# Patient Record
Sex: Female | Born: 1995
Health system: Southern US, Community
[De-identification: ages and names within clinical notes are randomized; demographics above are authoritative.]

## PROBLEM LIST (undated history)

## (undated) DIAGNOSIS — O2230 Deep phlebothrombosis in pregnancy, unspecified trimester: Secondary | ICD-10-CM

## (undated) DIAGNOSIS — O139 Gestational [pregnancy-induced] hypertension without significant proteinuria, unspecified trimester: Secondary | ICD-10-CM

## (undated) DIAGNOSIS — O24419 Gestational diabetes mellitus in pregnancy, unspecified control: Secondary | ICD-10-CM

## (undated) DIAGNOSIS — A4902 Methicillin resistant Staphylococcus aureus infection, unspecified site: Secondary | ICD-10-CM

## (undated) DIAGNOSIS — T7840XA Allergy, unspecified, initial encounter: Secondary | ICD-10-CM

## (undated) DIAGNOSIS — A6000 Herpesviral infection of urogenital system, unspecified: Secondary | ICD-10-CM

## (undated) DIAGNOSIS — T8859XA Other complications of anesthesia, initial encounter: Secondary | ICD-10-CM

## (undated) DIAGNOSIS — R519 Headache, unspecified: Secondary | ICD-10-CM

## (undated) HISTORY — DX: Gestational (pregnancy-induced) hypertension without significant proteinuria, unspecified trimester: O13.9

## (undated) HISTORY — DX: Allergy, unspecified, initial encounter: T78.40XA

## (undated) HISTORY — PX: TONSILLECTOMY: SUR1361

## (undated) HISTORY — DX: Methicillin resistant Staphylococcus aureus infection, unspecified site: A49.02

## (undated) HISTORY — PX: CHOLECYSTECTOMY, LAPAROSCOPIC: SHX56

---
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
## 2013-03-17 ENCOUNTER — Emergency Department: Payer: Self-pay | Admitting: Emergency Medicine

## 2013-08-25 ENCOUNTER — Emergency Department (HOSPITAL_COMMUNITY)
Admission: EM | Admit: 2013-08-25 | Discharge: 2013-08-25 | Discharge status: Against Medical Advice | Payer: Self-pay | Attending: Emergency Medicine | Admitting: Emergency Medicine

## 2013-08-25 ENCOUNTER — Encounter (HOSPITAL_COMMUNITY): Payer: Self-pay | Admitting: Emergency Medicine

## 2013-08-25 DIAGNOSIS — S3140XA Unspecified open wound of vagina and vulva, initial encounter: Secondary | ICD-10-CM | POA: Insufficient documentation

## 2013-08-25 DIAGNOSIS — W268XXA Contact with other sharp object(s), not elsewhere classified, initial encounter: Secondary | ICD-10-CM | POA: Insufficient documentation

## 2013-08-25 DIAGNOSIS — F17200 Nicotine dependence, unspecified, uncomplicated: Secondary | ICD-10-CM | POA: Insufficient documentation

## 2013-08-25 DIAGNOSIS — Y929 Unspecified place or not applicable: Secondary | ICD-10-CM | POA: Insufficient documentation

## 2013-08-25 DIAGNOSIS — Y9389 Activity, other specified: Secondary | ICD-10-CM | POA: Insufficient documentation

## 2013-08-25 NOTE — ED Notes (Signed)
No answer, unable to find, not in w/r, h/w, entryway, b/r or triage.  

## 2013-08-25 NOTE — ED Notes (Signed)
Pt c/o multiple "cuts" and swelling to vaginal area x 5 days. Pt reports yellowish discharge. Pt denies trauma to the area, reports that she does shave.

## 2014-08-01 NOTE — H&P (Signed)
PATIENT NAME:  Angela Flynn, Angela Flynn MR#:  [id number] DATE OF BIRTH:  04/24/1995  DATE OF ADMISSION:  09/18/2012  CHIEF COMPLAINT: Right upper quadrant pain.   HISTORY OF PRESENT ILLNESS: This is a 19 year old female patient with abdominal pain.  She has had some nausea and vomiting.  She has been to the Emergency Room.  She has had cramping and abdominal pain over a year's period but worsening over the last month, and she is here for elective laparoscopic cholecystectomy after her workup showed gallstones.   PAST MEDICAL HISTORY: History of MRSA infection.   PAST SURGICAL HISTORY:  1.  Tonsillectomy.  2.  Adenoidectomy.  3.  Cyst excision from chest wall.   MEDICATIONS: Percocet.   ALLERGIES: None.   FAMILY HISTORY: Noncontributory.   SOCIAL HISTORY: The patient smokes tobacco but does not drink alcohol.   REVIEW OF SYSTEMS: Ten-system review has been performed and negative with the exception of that mentioned in the HPI.   PHYSICAL EXAMINATION: GENERAL: Healthy-appearing female patient.  HEENT: No scleral icterus.  NECK: No palpable neck nodes.  CHEST: Clear to auscultation.  CARDIAC: Regular rate and rhythm.  ABDOMEN: Soft and minimally tender in the right upper quadrant.   EXTREMITIES: Without edema.  NEUROLOGIC: Grossly intact.  INTEGUMENT: Shows no jaundice.   LABORATORY AND RADIOLOGICAL DATA:  Lab values demonstrate normal electrolytes. White blood cell count 8.3, H and H at 13 and 40.  An ultrasound shows gallstones.   ASSESSMENT AND PLAN: This is a patient with symptomatic cholelithiasis.  I have recommended a laparoscopic cholecystectomy for control of her symptoms.  The options of observation have been reviewed and the risks of bleeding, infection, recurrence of symptoms, failure to resolve her symptoms, open procedure, bile duct damage, bile duct leak, retained common bile duct stone, any of which could require further surgery and/or ERCP, stent and papillotomy have all  been discussed. She understood and agreed to proceed.     ____________________________ Adah Salvageichard E. Excell Seltzerooper, MD rec:cb D: 09/17/2012 18:19:42 ET T: 09/17/2012 19:59:58 ET JOB#: [id number]  cc: Adah Salvageichard E. Excell Seltzerooper, MD, <Dictator> Lattie HawICHARD E Kerby Borner MD ELECTRONICALLY SIGNED 09/18/2012 16:55

## 2014-08-01 NOTE — Op Note (Signed)
PATIENT NAME:  Angela Flynn, Angela Flynn MR#:  [id number] DATE OF BIRTH:  [birth year]  DATE OF PROCEDURE:  09/18/2012  PREOPERATIVE DIAGNOSIS: Biliary colic.   POSTOPERATIVE DIAGNOSIS: Biliary colic.   PROCEDURE: Laparoscopic cholecystectomy.   SURGEON: Miaisabella Bacorn E. Excell Seltzerooper, MD  ASSISTANT: Trellis MomentNatalie Kretzer, PA-S   INDICATIONS: This is a patient with recurrent episodic right upper quadrant pain associated with fatty food intolerance and workup showing gallstones. Preoperatively, we discussed the rationale for surgery, the options of observation, risk of bleeding, infection, recurrence of symptoms, failure to resolve all of her symptoms, open procedure, bile duct damage, bile duct leak and retained common bile duct stone, any of which could require further surgery and/or ERCP, stent and papillotomy. This was reviewed for her and her family in the preop holding area. They understood and agreed to proceed.   FINDINGS: Normal-appearing gallbladder with small gallstones, minimal scarified adhesions to the anterior surface of the gallbladder.   DESCRIPTION OF PROCEDURE: The patient was induced to general anesthesia, given IV antibiotics. VTE prophylaxis was in place. She was prepped and draped in a sterile fashion. Marcaine was infiltrated in skin and subcutaneous tissues around the periumbilical area. Incision was made. Veress needle was placed. Pneumoperitoneum was obtained, and a 5 mm trocar port was placed. The abdominal cavity was explored, and under direct vision, a 10 mm epigastric port and 2 lateral 5 mm ports were placed. The gallbladder was placed on tension, and adhesions were taken down bluntly without the use of energy. The peritoneum over the infundibulum was incised bluntly, and the cystic duct-gallbladder junction was well identified. The node of Calot was identified. The cystic lymphatics were doubly clipped and divided, allowing for good visualization of the cystic duct as it entered the infundibulum of  the gallbladder. Here, it was doubly clipped and divided, and then the cystic artery was doubly clipped and divided, and the gallbladder was taken from the gallbladder fossa with electrocautery and passed out through the epigastric port site with the aid of an Endo Catch bag. The area was irrigated with copious amounts of normal saline, which was aspirated. Small stones were retrieved that had been spilled, and then once assuring that there was no sign of bleeding or bile leak or bowel injury, the camera was placed in the epigastric site to view back to the periumbilical site. There was no sign of adhesions or bowel injury. Therefore, pneumoperitoneum was released. All ports were removed. Fascial edges at the epigastric site were approximated with figure-of-eight 0 Vicryls. The 4-0 subcuticular Monocryl was used on all skin edges. Steri-Strips, Mastisol and sterile dressings were placed.   The patient tolerated the procedure well. There were no complications. She was taken to the recovery room in stable condition, to be discharged in the care of her family and follow up in 10 days. Sponge, lap and needle count was correct.   ____________________________ Adah Salvageichard E. Excell Seltzerooper, MD rec:OSi D: 09/18/2012 08:11:19 ET T: 09/18/2012 09:56:05 ET JOB#: [id number]  cc: Adah Salvageichard E. Excell Seltzerooper, MD, <Dictator> Lattie HawICHARD E Truong Delcastillo MD ELECTRONICALLY SIGNED 09/18/2012 16:55

## 2014-08-07 ENCOUNTER — Encounter (HOSPITAL_COMMUNITY): Payer: Self-pay | Admitting: Emergency Medicine

## 2014-08-07 ENCOUNTER — Emergency Department (HOSPITAL_COMMUNITY)
Admission: EM | Admit: 2014-08-07 | Discharge: 2014-08-07 | Disposition: A | Discharge status: Home / Self Care | Payer: Self-pay | Attending: Emergency Medicine | Admitting: Emergency Medicine

## 2014-08-07 ENCOUNTER — Emergency Department (HOSPITAL_COMMUNITY): Payer: Self-pay

## 2014-08-07 DIAGNOSIS — Z720 Tobacco use: Secondary | ICD-10-CM | POA: Insufficient documentation

## 2014-08-07 DIAGNOSIS — Y939 Activity, unspecified: Secondary | ICD-10-CM | POA: Insufficient documentation

## 2014-08-07 DIAGNOSIS — Y92009 Unspecified place in unspecified non-institutional (private) residence as the place of occurrence of the external cause: Secondary | ICD-10-CM | POA: Insufficient documentation

## 2014-08-07 DIAGNOSIS — S50811A Abrasion of right forearm, initial encounter: Secondary | ICD-10-CM | POA: Insufficient documentation

## 2014-08-07 DIAGNOSIS — Y999 Unspecified external cause status: Secondary | ICD-10-CM | POA: Insufficient documentation

## 2014-08-07 DIAGNOSIS — W25XXXA Contact with sharp glass, initial encounter: Secondary | ICD-10-CM | POA: Insufficient documentation

## 2014-08-07 DIAGNOSIS — Z23 Encounter for immunization: Secondary | ICD-10-CM | POA: Insufficient documentation

## 2014-08-07 DIAGNOSIS — S81812A Laceration without foreign body, left lower leg, initial encounter: Secondary | ICD-10-CM | POA: Insufficient documentation

## 2014-08-07 MED ORDER — SULFAMETHOXAZOLE-TRIMETHOPRIM 800-160 MG PO TABS: 1.0000 | ORAL_TABLET | Freq: Two times a day (BID) | ORAL | Status: AC

## 2014-08-07 MED ORDER — TETANUS-DIPHTH-ACELL PERTUSSIS 5-2.5-18.5 LF-MCG/0.5 IM SUSP
0.5000 mL | Freq: Once | INTRAMUSCULAR | Status: AC
  Administered 2014-08-07: 0.5 mL via INTRAMUSCULAR
  Filled 2014-08-07: qty 0.5

## 2014-08-07 MED ORDER — OXYCODONE-ACETAMINOPHEN 5-325 MG PO TABS
2.0000 | ORAL_TABLET | Freq: Once | ORAL | Status: AC
  Administered 2014-08-07: 2 via ORAL
  Filled 2014-08-07: qty 2

## 2014-08-07 MED ORDER — LIDOCAINE-EPINEPHRINE (PF) 2 %-1:200000 IJ SOLN
10.0000 mL | Freq: Once | INTRAMUSCULAR | Status: AC
  Administered 2014-08-07: 10 mL
  Filled 2014-08-07: qty 20

## 2014-08-07 MED ORDER — HYDROCODONE-ACETAMINOPHEN 5-325 MG PO TABS: 1.0000 | ORAL_TABLET | Freq: Four times a day (QID) | ORAL | Status: DC | PRN

## 2014-08-07 NOTE — ED Provider Notes (Signed)
CSN: [id number]     Arrival date & time 08/07/14  2027 History  This chart was scribed for Santiago GladHeather Copelyn Widmer PA-C working with Lorre NickAnthony Allen, MD by Elveria Risingimelie Horne, ED Scribe. This patient was seen in room TR10C/TR10C and the patient's care was started at 9:42 PM.   Chief Complaint  Patient presents with  . Extremity Laceration   The history is provided by the patient. No language interpreter was used.   HPI Comments: Angela Flynn is a 19 y.o. female who presents to the Emergency Department with laceration to left lower anterolateral leg and multiple abrasions incurred early this morning. Patient reports dropping a large picture frame, shattering the glass then tripping, landing on the ventral surface of body and sliding on the broken glass.  Patient reports a violent fall; she presents with smaller lacerations and abrasions to her right forearm and broken acrylic nails. Patient reports profuse bleeding from the laceration sustained to her lower leg. Patient reports exacerbated pain with applied pressure. Patient ambulatory since the incident. Patient has not taken any pain medication. Patient states that she did come to ED immediately following her injury because she wasn't aware of the severity. Foreign body removed from laceration on exam: 1cm piece of shattered glass.    History reviewed. No pertinent past medical history. Past Surgical History  Procedure Laterality Date  . Abdominal surgery     No family history on file. History  Substance Use Topics  . Smoking status: Current Every Day Smoker  . Smokeless tobacco: Not on file  . Alcohol Use: Yes   OB History    No data available     Review of Systems  Constitutional: Negative for fever.  Skin: Positive for wound.       Laceration   Neurological: Negative for weakness and numbness.      Allergies  Review of patient's allergies indicates no known allergies.  Home Medications   Prior to Admission medications   Not on  File   Triage Vitals: BP 114/89 mmHg  Pulse 93  Temp(Src) 98.5 F (36.9 C) (Oral)  Resp 16  Ht 5\' 9"  (1.753 m)  Wt 228 lb (103.42 kg)  BMI 33.65 kg/m2  SpO2 99%  LMP 08/07/2014 Physical Exam  Constitutional: She is oriented to person, place, and time. She appears well-developed and well-nourished. No distress.  HENT:  Head: Normocephalic and atraumatic.  Eyes: EOM are normal.  Neck: Neck supple. No tracheal deviation present.  Cardiovascular: Normal rate, regular rhythm and normal heart sounds.   Pulmonary/Chest: Effort normal and breath sounds normal. No respiratory distress.  Musculoskeletal: Normal range of motion.  Neurological: She is alert and oriented to person, place, and time.  Skin: Skin is warm and dry.  5.5 cm gaping linear laceration to left anterior lateral superior portion of lower leg. Bleeding controlled.  Linear abrasion just superior to the laceration.  Linear abrasion to the left lower lateral leg.  No active bleeding.  Psychiatric: She has a normal mood and affect. Her behavior is normal.  Nursing note and vitals reviewed.   ED Course  Procedures (including critical care time)  COORDINATION OF CARE: 9:56 PM- Discussed treatment plan with patient at bedside and patient agreed to plan.   Labs Review Labs Reviewed - No data to display  Imaging Review No results found.   EKG Interpretation None      Patient discussed with Dr Freida BusmanAllen.  LACERATION REPAIR Performed by: Santiago GladLaisure, Zaide Mcclenahan Authorized by: Santiago GladLaisure, Daryan Cagley Consent: Verbal  consent obtained. Risks and benefits: risks, benefits and alternatives were discussed Consent given by: patient Patient identity confirmed: provided demographic data Prepped and Draped in normal sterile fashion Wound explored  Laceration Location: left lower leg  Laceration Length: 5.5 cm  No Foreign Bodies seen or palpated  Anesthesia: local infiltration  Local anesthetic: lidocaine 2% with  epinephrine  Anesthetic total: 5 ml  Irrigation method: syringe Amount of cleaning: standard  Skin closure: staples  Number of sutures: 6  Technique: staple  Patient tolerance: Patient tolerated the procedure well with no immediate complications.  MDM   Final diagnoses:  None   Patient with a laceration to the left lower leg that was sustained approximately 17 hours prior to arrival in the ED.  Laceration appears clean with no signs of infection.  Laceration is 5.5 cm and widely gaping.  Therefore, feel that wound should be closed even though it is past the recommended time.  Wound irrigated very well prior to closure.  Xray not showing any evidence of foreign body.  Wound explored and no foreign body visualized.  Due to the fact that closure was delayed the patient was started on antibiotics.  Tetanus updated in the ED.  Feel that the patient is stable for discharge.  Patient discussed with Dr. Freida Busman who is in agreement with the plan.  Return precautions given.    Santiago Glad, PA-C 08/09/14 1102  Lorre Nick, MD 08/13/14 [phone]

## 2014-08-07 NOTE — ED Notes (Signed)
Pt. slipped / fell last night at home and landed on broken glass , presents with laceration at left lateral lower leg with minimal bleeding /dressing applied prior to arrival . No LOC / ambulatory.

## 2014-08-12 ENCOUNTER — Emergency Department
Admission: EM | Admit: 2014-08-12 | Discharge: 2014-08-12 | Disposition: A | Discharge status: Home / Self Care | Payer: Self-pay | Attending: Emergency Medicine | Admitting: Emergency Medicine

## 2014-08-12 ENCOUNTER — Encounter: Payer: Self-pay | Admitting: Emergency Medicine

## 2014-08-12 DIAGNOSIS — R52 Pain, unspecified: Secondary | ICD-10-CM

## 2014-08-12 DIAGNOSIS — Z720 Tobacco use: Secondary | ICD-10-CM | POA: Insufficient documentation

## 2014-08-12 DIAGNOSIS — Z5189 Encounter for other specified aftercare: Secondary | ICD-10-CM

## 2014-08-12 DIAGNOSIS — Z792 Long term (current) use of antibiotics: Secondary | ICD-10-CM | POA: Insufficient documentation

## 2014-08-12 DIAGNOSIS — Z4801 Encounter for change or removal of surgical wound dressing: Secondary | ICD-10-CM | POA: Insufficient documentation

## 2014-08-12 DIAGNOSIS — G8911 Acute pain due to trauma: Secondary | ICD-10-CM | POA: Insufficient documentation

## 2014-08-12 MED ORDER — TRAMADOL HCL 50 MG PO TABS: 50.0000 mg | ORAL_TABLET | Freq: Four times a day (QID) | ORAL | Status: DC | PRN

## 2014-08-12 MED ORDER — IBUPROFEN 800 MG PO TABS: 800.0000 mg | ORAL_TABLET | Freq: Three times a day (TID) | ORAL | Status: DC | PRN

## 2014-08-12 NOTE — ED Notes (Signed)
Had staples placed at [facility] 3 days ago for left lower leg injury, stats is out of her pain meds,  Did not want to go to [place]

## 2014-08-12 NOTE — ED Notes (Signed)
Pt reports that a picture frame broke and cut her leg, [facility] and [facility] gave her staples. She reports that there is an open area that she needs looked at and she only got 10 pain pills and she has went through them and is still hurting.

## 2014-08-12 NOTE — ED Provider Notes (Signed)
[facility] Emergency Department Provider Note    ____________________________________________  Time seen:1745   I have reviewed the triage vital signs and the nursing notes.   HISTORY  Chief Complaint Wound Check       HPI Angela Flynn is a 19 y.o. female here for wound reevaluation and pain management states she was seen April 28 cut her leg on glass that wound is healing well but it's hurting she is out of pain medications here today for recheck reevaluation and pain management rates her pain as about a 10 out of 10 if she moves or touches her leg says there is no drainage she's taking all antibiotics as prescribed and changing the dressings daily     Past Medical History  Diagnosis Date  . Arthritis     There are no active problems to display for this patient.   Past Surgical History  Procedure Laterality Date  . Abdominal surgery      Current Outpatient Rx  Name  Route  Sig  Dispense  Refill  . HYDROcodone-acetaminophen (NORCO/VICODIN) 5-325 MG per tablet   Oral   Take 1-2 tablets by mouth every 6 (six) hours as needed.   10 tablet   0   . ibuprofen (ADVIL,MOTRIN) 800 MG tablet   Oral   Take 1 tablet (800 mg total) by mouth every 8 (eight) hours as needed.   30 tablet   0   . sulfamethoxazole-trimethoprim (BACTRIM DS,SEPTRA DS) 800-160 MG per tablet   Oral   Take 1 tablet by mouth 2 (two) times daily.   14 tablet   0   . traMADol (ULTRAM) 50 MG tablet   Oral   Take 1 tablet (50 mg total) by mouth every 6 (six) hours as needed.   12 tablet   0     Allergies Review of patient's allergies indicates no known allergies.  No family history on file.  Social History History  Substance Use Topics  . Smoking status: Current Every Day Smoker  . Smokeless tobacco: Not on file  . Alcohol Use: Yes    Review of Systems  Constitutional: Negative for fever. Eyes: Negative for visual changes. ENT: Negative for sore  throat. Cardiovascular: Negative for chest pain. Respiratory: Negative for shortness of breath. Gastrointestinal: Negative for abdominal pain, vomiting and diarrhea. Genitourinary: Negative for dysuria. Musculoskeletal: Negative for back pain. Skin: Negative for rash. Neurological: Negative for headaches, focal weakness or numbness.   10-point ROS otherwise negative.  ____________________________________________   PHYSICAL EXAM:  VITAL SIGNS: ED Triage Vitals  Enc Vitals Group     BP 08/12/14 1643 140/87 mmHg     Pulse Rate 08/12/14 1643 95     Resp --      Temp 08/12/14 1643 99.5 F (37.5 C)     Temp Source 08/12/14 1643 Oral     SpO2 08/12/14 1643 98 %     Weight 08/12/14 1643 238 lb (107.956 kg)     Height 08/12/14 1643 5\' 9"  (1.753 m)     Head Cir --      Peak Flow --      Pain Score 08/12/14 1650 9     Pain Loc --      Pain Edu? --      Excl. in GC? --        Cardiovascular: Normal rate, regular rhythm. Normal and symmetric distal pulses are present in all extremities. No murmurs, rubs, or gallops. Respiratory: Normal respiratory effort  without tachypnea nor retractions. Breath sounds are clear and equal bilaterally. No wheezes/rales/rhonchi.  Musculoskeletal: Nontender with normal range of motion in all extremities. No joint effusions.  No lower extremity tenderness nor edema. Neurologic:  Normal speech and language. No gross focal neurologic deficits are appreciated. Speech is normal. No gait instability. Skin:  Patient has a well-healing wound to her left leg staples in place no sign of infection cellulitis Psychiatric: Mood and affect are normal. Speech and behavior are normal. Patient exhibits appropriate insight and judgment.  ____________________________________________      PROCEDURES  Procedure(s) performed: None  Critical Care performed: No  ____________________________________________   INITIAL IMPRESSION / ASSESSMENT AND PLAN / ED  COURSE  Pertinent labs & imaging results that were available during my care of the patient were reviewed by me and considered in my medical decision making (see chart for details).  Wound recheck of laceration pain management patient's wound was redressed with discharge home given prescription for Motrin tramadol follow-up with her doctor as directed for staple removal  ____________________________________________   FINAL CLINICAL IMPRESSION(S) / ED DIAGNOSES  Final diagnoses:  Encounter for post-traumatic wound check  Pain    Alfred Eckley Rosalyn Gess, PA-C 08/12/14 1815  Loleta Rose, MD 08/12/14 2108

## 2014-08-19 ENCOUNTER — Emergency Department
Admission: EM | Admit: 2014-08-19 | Discharge: 2014-08-19 | Disposition: A | Discharge status: Home / Self Care | Payer: Self-pay | Attending: Emergency Medicine | Admitting: Emergency Medicine

## 2014-08-19 ENCOUNTER — Encounter: Payer: Self-pay | Admitting: Emergency Medicine

## 2014-08-19 DIAGNOSIS — Z4802 Encounter for removal of sutures: Secondary | ICD-10-CM | POA: Insufficient documentation

## 2014-08-19 DIAGNOSIS — Z720 Tobacco use: Secondary | ICD-10-CM | POA: Insufficient documentation

## 2014-08-19 MED ORDER — BACITRACIN ZINC 500 UNIT/GM EX OINT
TOPICAL_OINTMENT | CUTANEOUS | Status: AC
  Administered 2014-08-19: 15:00:00
  Filled 2014-08-19: qty 0.9

## 2014-08-19 NOTE — ED Provider Notes (Signed)
[facility] Emergency Department Provider Note  ____________________________________________  Time seen:----------------------------------------- 2:02 PM on 08/19/2014 -----------------------------------------    I have reviewed the triage vital signs and the nursing notes.   HISTORY  Chief Complaint Suture / Staple Removal  Staple removal  HPI Angela Flynn is a 19 y.o. female since the ER for staple removal denies any complaints at this time. States she's doing fine.   Past Medical History  Diagnosis Date  . Arthritis     There are no active problems to display for this patient.   Past Surgical History  Procedure Laterality Date  . Abdominal surgery      Current Outpatient Rx  Name  Route  Sig  Dispense  Refill  . HYDROcodone-acetaminophen (NORCO/VICODIN) 5-325 MG per tablet   Oral   Take 1-2 tablets by mouth every 6 (six) hours as needed.   10 tablet   0   . ibuprofen (ADVIL,MOTRIN) 800 MG tablet   Oral   Take 1 tablet (800 mg total) by mouth every 8 (eight) hours as needed.   30 tablet   0   . traMADol (ULTRAM) 50 MG tablet   Oral   Take 1 tablet (50 mg total) by mouth every 6 (six) hours as needed.   12 tablet   0     Allergies Review of patient's allergies indicates no known allergies.  History reviewed. No pertinent family history.  Social History History  Substance Use Topics  . Smoking status: Current Every Day Smoker  . Smokeless tobacco: Not on file  . Alcohol Use: Yes    Review of Systems Constitutional: No fever/chills Skin: Negative for rash. Left inner thigh well-healed laceration staples intact with no evidence of erythema. Superficial wound still opened above the laceration. No obvious drainage or erythema or bleeding.   10-point ROS otherwise negative.  ____________________________________________   PHYSICAL EXAM:  VITAL SIGNS: ED Triage Vitals  Enc Vitals Group     BP --      Pulse  --      Resp --      Temp --      Temp src --      SpO2 --      Weight --      Height --      Head Cir --      Peak Flow --      Pain Score --      Pain Loc --      Pain Edu? --      Excl. in GC? --     Constitutional: Alert and oriented. Well appearing and in no acute distress.  Skin:  Skin is warm, dry and intact. No rash noted. Staples intact. No redness erythema or drainage. Psychiatric: Mood and affect are normal. Speech and behavior are normal.  ____________________________________________   LABS (all labs ordered are listed, but only abnormal results are displayed)  Labs Reviewed - No data to display ____________________________________________  EKG  None ____________________________________________  RADIOLOGY  None ____________________________________________   PROCEDURES  Procedure(s) performed: None  Critical Care performed: No  ____________________________________________   INITIAL IMPRESSION / ASSESSMENT AND PLAN / ED COURSE  Pertinent labs & imaging results that were available during my care of the patient were reviewed by me and considered in my medical decision making (see chart for details).  Staples removed instructions given to the patient. No further questions or [facility] at this time. thank you ____________________________________________   FINAL CLINICAL IMPRESSION(S) /  ED DIAGNOSES  Final diagnoses:  Encounter for staple removal      Evangeline DakinCharles M Saddie Sandeen, PA-C 08/19/14 1505  Myrna Blazeravid Matthew Schaevitz, MD 08/19/14 [phone]

## 2014-08-19 NOTE — Discharge Instructions (Signed)
Use Bacitracin or Triple Anti-biotic ointment as needed.

## 2015-03-28 ENCOUNTER — Emergency Department: Payer: Self-pay

## 2015-03-28 ENCOUNTER — Encounter: Payer: Self-pay | Admitting: Emergency Medicine

## 2015-03-28 ENCOUNTER — Inpatient Hospital Stay
Admission: EM | Admit: 2015-03-28 | Discharge: 2015-03-31 | DRG: 759 | Disposition: A | Discharge status: Home / Self Care | Payer: Self-pay | Attending: Obstetrics and Gynecology | Admitting: Obstetrics and Gynecology

## 2015-03-28 DIAGNOSIS — F17210 Nicotine dependence, cigarettes, uncomplicated: Secondary | ICD-10-CM | POA: Diagnosis present

## 2015-03-28 DIAGNOSIS — D72829 Elevated white blood cell count, unspecified: Secondary | ICD-10-CM | POA: Diagnosis present

## 2015-03-28 DIAGNOSIS — R6510 Systemic inflammatory response syndrome (SIRS) of non-infectious origin without acute organ dysfunction: Secondary | ICD-10-CM | POA: Diagnosis present

## 2015-03-28 DIAGNOSIS — N739 Female pelvic inflammatory disease, unspecified: Secondary | ICD-10-CM | POA: Diagnosis present

## 2015-03-28 DIAGNOSIS — A549 Gonococcal infection, unspecified: Secondary | ICD-10-CM | POA: Diagnosis present

## 2015-03-28 DIAGNOSIS — E876 Hypokalemia: Secondary | ICD-10-CM | POA: Diagnosis present

## 2015-03-28 DIAGNOSIS — A5424 Gonococcal female pelvic inflammatory disease: Principal | ICD-10-CM | POA: Diagnosis present

## 2015-03-28 HISTORY — DX: Herpesviral infection of urogenital system, unspecified: A60.00

## 2015-03-28 LAB — COMPREHENSIVE METABOLIC PANEL
ALK PHOS: 74 U/L (ref 38–126)
ALT: 12 U/L — ABNORMAL LOW (ref 14–54)
AST: 13 U/L — AB (ref 15–41)
Albumin: 3.9 g/dL (ref 3.5–5.0)
Anion gap: 11 (ref 5–15)
BILIRUBIN TOTAL: 1.1 mg/dL (ref 0.3–1.2)
BUN: 13 mg/dL (ref 6–20)
CALCIUM: 9 mg/dL (ref 8.9–10.3)
CO2: 24 mmol/L (ref 22–32)
CREATININE: 0.57 mg/dL (ref 0.44–1.00)
Chloride: 102 mmol/L (ref 101–111)
GFR calc Af Amer: >60 mL/min (ref >60)
Glucose, Bld: 112 mg/dL — ABNORMAL HIGH (ref 65–99)
POTASSIUM: 3.3 mmol/L — AB (ref 3.5–5.1)
Sodium: 137 mmol/L (ref 135–145)
TOTAL PROTEIN: 7.7 g/dL (ref 6.5–8.1)

## 2015-03-28 LAB — CBC
HEMATOCRIT: 41.7 % (ref 35.0–47.0)
Hemoglobin: 13.7 g/dL (ref 12.0–16.0)
MCH: 30.4 pg (ref 26.0–34.0)
MCHC: 32.9 g/dL (ref 32.0–36.0)
MCV: 92.4 fL (ref 80.0–100.0)
PLATELETS: 250 10*3/uL (ref 150–440)
RBC: 4.51 MIL/uL (ref 3.80–5.20)
RDW: 12.8 % (ref 11.5–14.5)
WBC: 30.1 10*3/uL — ABNORMAL HIGH (ref 3.6–11.0)

## 2015-03-28 LAB — URINALYSIS COMPLETE WITH MICROSCOPIC (ARMC ONLY)
BACTERIA UA: NONE SEEN
BILIRUBIN URINE: NEGATIVE
Glucose, UA: NEGATIVE mg/dL
NITRITE: NEGATIVE
Protein, ur: 100 mg/dL — AB
Specific Gravity, Urine: 1.035 — ABNORMAL HIGH (ref 1.005–1.030)
pH: 5 (ref 5.0–8.0)

## 2015-03-28 LAB — CHLAMYDIA/NGC RT PCR (ARMC ONLY)
CHLAMYDIA TR: NOT DETECTED
N GONORRHOEAE: DETECTED — AB

## 2015-03-28 LAB — POCT PREGNANCY, URINE: PREG TEST UR: NEGATIVE

## 2015-03-28 LAB — LIPASE, BLOOD: Lipase: 13 U/L (ref 11–51)

## 2015-03-28 MED ORDER — SODIUM CHLORIDE 0.9 % IV BOLUS (SEPSIS)
1000.0000 mL | Freq: Once | INTRAVENOUS | Status: AC
  Administered 2015-03-28: 1000 mL via INTRAVENOUS

## 2015-03-28 MED ORDER — HYDROMORPHONE HCL 1 MG/ML IJ SOLN
1.0000 mg | INTRAMUSCULAR | Status: AC
  Administered 2015-03-28: 1 mg via INTRAVENOUS
  Filled 2015-03-28: qty 1

## 2015-03-28 MED ORDER — HYDROMORPHONE HCL 1 MG/ML IJ SOLN
0.5000 mg | Freq: Once | INTRAMUSCULAR | Status: AC
Start: 2015-03-28 — End: 2015-03-28
  Administered 2015-03-28: 0.5 mg via INTRAVENOUS

## 2015-03-28 MED ORDER — DEXTROSE 5 % IV SOLN
2.0000 g | Freq: Four times a day (QID) | INTRAVENOUS | Status: DC
  Administered 2015-03-29 – 2015-03-30 (×7): 2 g via INTRAVENOUS
  Filled 2015-03-28 (×10): qty 2

## 2015-03-28 MED ORDER — HYDROMORPHONE HCL 1 MG/ML IJ SOLN
1.0000 mg | Freq: Once | INTRAMUSCULAR | Status: AC
Start: 2015-03-28 — End: 2015-03-28
  Administered 2015-03-28: 1 mg via INTRAVENOUS

## 2015-03-28 MED ORDER — ONDANSETRON HCL 4 MG/2ML IJ SOLN
4.0000 mg | Freq: Once | INTRAMUSCULAR | Status: AC
  Administered 2015-03-28: 4 mg via INTRAVENOUS

## 2015-03-28 MED ORDER — IOHEXOL 240 MG/ML SOLN
25.0000 mL | Freq: Once | INTRAMUSCULAR | Status: AC | PRN
  Administered 2015-03-28: 25 mL via ORAL

## 2015-03-28 MED ORDER — MORPHINE SULFATE (PF) 4 MG/ML IV SOLN
4.0000 mg | Freq: Once | INTRAVENOUS | Status: AC
  Administered 2015-03-28: 4 mg via INTRAVENOUS
  Filled 2015-03-28: qty 1

## 2015-03-28 MED ORDER — IOHEXOL 300 MG/ML  SOLN: 100.0000 mL | Freq: Once | INTRAMUSCULAR | Status: DC | PRN

## 2015-03-28 MED ORDER — IOHEXOL 300 MG/ML  SOLN
125.0000 mL | Freq: Once | INTRAMUSCULAR | Status: AC | PRN
  Administered 2015-03-28: 125 mL via INTRAVENOUS

## 2015-03-28 MED ORDER — POTASSIUM CHLORIDE IN NACL 20-0.9 MEQ/L-% IV SOLN
INTRAVENOUS | Status: DC
  Administered 2015-03-28 – 2015-03-30 (×3): via INTRAVENOUS
  Filled 2015-03-28 (×9): qty 1000

## 2015-03-28 MED ORDER — DEXTROSE 5 % IV SOLN
2.0000 g | Freq: Once | INTRAVENOUS | Status: AC
  Administered 2015-03-28: 2 g via INTRAVENOUS
  Filled 2015-03-28: qty 2

## 2015-03-28 MED ORDER — ONDANSETRON HCL 4 MG/2ML IJ SOLN
INTRAMUSCULAR | Status: AC
  Administered 2015-03-28: 4 mg via INTRAVENOUS
  Filled 2015-03-28: qty 2

## 2015-03-28 MED ORDER — KETOROLAC TROMETHAMINE 30 MG/ML IJ SOLN
30.0000 mg | Freq: Once | INTRAMUSCULAR | Status: AC
  Administered 2015-03-28: 30 mg via INTRAVENOUS
  Filled 2015-03-28: qty 1

## 2015-03-28 MED ORDER — ONDANSETRON HCL 4 MG/2ML IJ SOLN: 4.0000 mg | Freq: Four times a day (QID) | INTRAMUSCULAR | Status: DC | PRN

## 2015-03-28 MED ORDER — HYDROMORPHONE HCL 1 MG/ML IJ SOLN
1.0000 mg | INTRAMUSCULAR | Status: DC | PRN
  Administered 2015-03-29 (×3): 1 mg via INTRAVENOUS
  Filled 2015-03-28 (×3): qty 1

## 2015-03-28 MED ORDER — ONDANSETRON HCL 4 MG PO TABS
4.0000 mg | ORAL_TABLET | Freq: Four times a day (QID) | ORAL | Status: DC | PRN
  Administered 2015-03-29 – 2015-03-31 (×4): 4 mg via ORAL
  Filled 2015-03-28 (×4): qty 1

## 2015-03-28 MED ORDER — HYDROMORPHONE HCL 1 MG/ML IJ SOLN
1.0000 mg | INTRAMUSCULAR | Status: AC
  Administered 2015-03-28: 1 mg via INTRAVENOUS

## 2015-03-28 MED ORDER — DOXYCYCLINE HYCLATE 100 MG PO TABS
100.0000 mg | ORAL_TABLET | Freq: Two times a day (BID) | ORAL | Status: DC
  Administered 2015-03-29 – 2015-03-31 (×5): 100 mg via ORAL
  Filled 2015-03-28 (×5): qty 1

## 2015-03-28 MED ORDER — HYDROMORPHONE HCL 1 MG/ML IJ SOLN
INTRAMUSCULAR | Status: AC
  Administered 2015-03-28: 1 mg via INTRAVENOUS
  Filled 2015-03-28: qty 1

## 2015-03-28 MED ORDER — ONDANSETRON HCL 4 MG/2ML IJ SOLN
INTRAMUSCULAR | Status: AC
  Filled 2015-03-28: qty 2

## 2015-03-28 MED ORDER — DOXYCYCLINE HYCLATE 100 MG IV SOLR
100.0000 mg | Freq: Once | INTRAVENOUS | Status: AC
  Administered 2015-03-28: 100 mg via INTRAVENOUS
  Filled 2015-03-28: qty 100

## 2015-03-28 MED ORDER — HYDROMORPHONE HCL 1 MG/ML IJ SOLN: 1.0000 mg | INTRAMUSCULAR | Status: DC

## 2015-03-28 NOTE — ED Notes (Signed)
Patient returned from CT

## 2015-03-28 NOTE — H&P (Signed)
GYNECOLOGY HISTORY AND PHYSICAL NOTE    Attending Provider: Conard Flynn, Angela Pacha D, MD   Leary RocaSierra Elena Flynn [id number] 03/28/2015 9:37 PM    Chief Complaint  Raoul PitchSierra Lillia Dallaslena Flynn is a 19 y.o. G2P0020 premenopausal female with acute-onset abdominal pain.    History of Present Ilness:   The patient began having abdominal pain 2 days ago. The pain is located in her lower abdomen below her umbilicus and in her right lower abdomen. She is unable to give a specific description of the pain. She states that the pain is 10 out of 10. It does not radiate. Sitting up makes the pain less. Lying down makes it feel worse. She has also been having nausea with emesis and diarrhea for the past 2 days. She denies fevers and chills. She denies chest pain and trouble breathing. Her last menstrual period recently started and is abnormal in the type of bleeding that she is having and she states the blood with this menses has a very bad odor. She has menses approximately once per month. They last about 7 days. She states that she has a history of chlamydia in the distant past. She also notes a recent exposure to gonorrhea about 2 months ago. She has never sought diagnosis or treatment for this exposure. She states that she does have dysuria.  Past Medical History  Diagnosis Date  . Herpes genitalis    Past Surgical History  Procedure Laterality Date  . Cholecystectomy, laparoscopic     Allergies: No Known Allergies    Prior to Admission medications   Medication Sig Start Date End Date Taking? Authorizing Provider  acyclovir (ZOVIRAX) 400 MG tablet Take 1,200 mg by mouth daily. 03/15/15  Yes Historical Provider, MD   Obstetric History: She is a 272P0020 female s/p SAB x 2 by historical record. The patient reports no pregnancies to me when asked directly.    Gynecologic History:   Menarche age: 72 Patient's last menstrual period was 03/28/2015. Cycle Hx: flow is moderate and regular every 28 days without  intermenstrual spotting Last Pap: this year, reportedly normal She denies a history of abnormal pap smears  She notes a history of chlamydia in the distant past that was treated.  She notes exposure to gonorrhea, as noted in the HPI Contraception: None  Social History:  She  reports that she has been smoking.  She does not have any smokeless tobacco history on file. She reports that she uses illicit drugs (Marijuana). She reports that she does not drink alcohol.  Family History:  family history is not on file.   Review of Systems:  Negative x 10 systems reviewed except as noted in the HPI.    Objective    BP 115/50 mmHg  Pulse 90  Temp(Src) 98.4 F (36.9 C) (Oral)  Resp 20  Ht 5\' 9"  (1.753 m)  Wt 230 lb (104.327 kg)  BMI 33.95 kg/m2  SpO2 98%  LMP 03/28/2015 Physical Exam  General:  She is a well appearing female in mild distress.  HEENT:  Normocephalic, atraumatic.   Neck:  supple, no lymphadenopathy Cardiac:  RRR Pulmonary:  Clear to auscultation bilaterally. No wheezes, rales, rhonchi.   Abdomen:  Soft, obese, mildly tender to palpation in the suprapubic area and just below the umbilicus, no rebound or guarding, +BS  Pelvic:  deferred Extremities:  Non-tender, symmetric no edema bilaterally.   Neurologic:  Alert & oriented x 3.  Appropriate, conversant.    Laboratory Results:   Lab Results  Component Value Date   WBC 30.1* 03/28/2015   RBC 4.51 03/28/2015   HGB 13.7 03/28/2015   HCT 41.7 03/28/2015   PLT 250 03/28/2015   NA 137 03/28/2015   K 3.3* 03/28/2015   CREATININE 0.57 03/28/2015   Lab Results  Component Value Date   PREGTESTUR NEGATIVE 03/28/2015    Imaging Results: CT abdomen/pelvis with contrast report, 03/28/2015.  CLINICAL DATA: Severe upper abdominal pain. Right upper and right lower quadrant pain. Nausea, vomiting and diarrhea. Symptoms for 2 days.  EXAM: CT ABDOMEN AND PELVIS WITH CONTRAST  TECHNIQUE: Multidetector CT imaging of the  abdomen and pelvis was performed using the standard protocol following bolus administration of intravenous contrast.  CONTRAST: OMNIPAQUE IOHEXOL 300 MG/ML SOLN, 25mL OMNIPAQUE IOHEXOL 240 MG/ML SOLN  COMPARISON: Right upper quadrant ultrasound 08/29/2012  FINDINGS: Lower chest: The included lung bases are clear.  Liver: Elongated left lobe. No focal lesion.  Hepatobiliary: Postcholecystectomy with clips in the gallbladder fossa. Prominence of the common bile duct measures 11 mm at the porta hepatis, likely sequela of cholecystectomy. No calcified choledocholithiasis.  Pancreas: No ductal dilatation or inflammation.  Spleen: Normal.  Adrenal glands: No nodule.  Kidneys: Symmetric renal enhancement. No hydronephrosis. No perinephric stranding or localizing renal abnormality.  Stomach/Bowel: Stomach physiologically distended. There are no dilated or thickened small bowel loops. Left-sided small bowel loops are fluid-filled but normal in caliber. Small volume of stool throughout the colon without colonic wall thickening. The appendix is tentatively identified.  Vascular/Lymphatic: Prominent left external iliac node measures 1.2 cm short axis. No inguinal adenopathy. No retroperitoneal adenopathy. Abdominal aorta is normal in caliber.  Reproductive: There is soft tissue induration and stranding about the pelvic fat planes. Small mesenteric thickening in the pelvis. The uterus is normal in size. The ovaries are symmetric in size. Small amount of free fluid in the pelvic cul-de-sac, may be loculated.  Bladder: Minimally distended. No evidence of wall thickening.  Other: No free air. No upper abdominal ascites.  Musculoskeletal: There are no acute or suspicious osseous abnormalities.  IMPRESSION: 1. Findings suspicious for pelvic inflammatory disease with induration of the pelvic fat planes. Small amount of free fluid in the pelvic cul-de-sac  may be loculated. 2. Postcholecystectomy with biliary prominence, likely postsurgical in etiology.   Electronically Signed  By: Rubye Oaks M.Flynn.  On: 03/28/2015 19:52  Assessment & Plan   Angela Flynn is a 19 y.o. G2P0020 premenopausal female with acute-onset abdominal pain with likely diagnosis of Pelvic inflammatory disease. She is meeting SIRS criteria by respiratory rate, WBC count.  Other considerations are in the differential, including C. difficil (though no obvious signs on CT scan) with such an elevated WBC count.    Plan:  1.  Admit for presumed PID treatment with antibiotics per CDC recommendations (Cefoxitin 2g Q6 hours IV, doxycycline  po Q12 hours) 2.  Leukocytosis: Will get differential added to CBC. Test for C. Diff and get blood cultures. 3. Hypokalemia: replete with PO and IV potassium. 4. PID: Treatment as above, blood cultures pending, will get urine culture, GC/Chlamydia NAAT pending.  Angela Novak, MD 03/28/2015 9:37 PM

## 2015-03-28 NOTE — ED Notes (Signed)
Patient transported to CT 

## 2015-03-28 NOTE — ED Notes (Signed)
Unsuccessful straight stick for cultures x2 (RH)

## 2015-03-28 NOTE — ED Notes (Signed)
Pt to ed with c/o intermittent abd pain, n/v/d x 3 days.

## 2015-03-28 NOTE — ED Provider Notes (Signed)
[facility] Emergency Department Provider Note REMINDER - THIS NOTE IS NOT A FINAL MEDICAL RECORD UNTIL IT IS SIGNED. UNTIL THEN, THE CONTENT BELOW MAY REFLECT INFORMATION FROM A DOCUMENTATION TEMPLATE, NOT THE ACTUAL PATIENT VISIT. ____________________________________________  Time seen: Approximately 5:00 PM  I have reviewed the triage vital signs and the nursing notes.   HISTORY  Chief Complaint Abdominal Pain    HPI Angela Flynn is a 19 y.o. female Presents for evaluation of abdominal pain with nausea and vomiting and diarrhea for about the last 2-3 days.  She reports that she started having back pain and pain in the upper abdomen, then followed by nausea and vomiting which is been ongoing seemingly worsened for the last 2-3 days. She reports that her family was concerned she could have "appendicitis". She reports that she has severe pain, mostly in the upper abdomen but also along the right side and right lower abdomen. Her period also started about 2 days ago, but she reports she is not having pain in the lower pelvis but has been having normal amounts of spotting, also notes a slight abnormal odor with her.  She has recently been sexually active, boyfriend notified her about 2 months ago he is positive for gonorrhea.  The pain is crampy, severe at times. Currently rated 10 out of 10 in the upper abdomen. Denies pregnancy.   Past Medical History  Diagnosis Date  . Herpes genitalis     Patient Active Problem List   Diagnosis Date Noted  . Pelvic inflammatory disease 03/28/2015  . SIRS (systemic inflammatory response syndrome) (HCC) 03/28/2015  . Leukocytosis 03/28/2015  . Hypokalemia 03/28/2015    Past Surgical History  Procedure Laterality Date  . Cholecystectomy, laparoscopic      Current Outpatient Rx  Name  Route  Sig  Dispense  Refill  . acyclovir (ZOVIRAX) 400 MG tablet   Oral   Take 1,200 mg by mouth daily.      0   .  HYDROcodone-acetaminophen (NORCO/VICODIN) 5-325 MG per tablet   Oral   Take 1-2 tablets by mouth every 6 (six) hours as needed.   10 tablet   0   . ibuprofen (ADVIL,MOTRIN) 800 MG tablet   Oral   Take 1 tablet (800 mg total) by mouth every 8 (eight) hours as needed.   30 tablet   0   . traMADol (ULTRAM) 50 MG tablet   Oral   Take 1 tablet (50 mg total) by mouth every 6 (six) hours as needed.   12 tablet   0     Allergies Review of patient's allergies indicates no known allergies.  History reviewed. No pertinent family history.  Social History Social History  Substance Use Topics  . Smoking status: Current Every Day Smoker  . Smokeless tobacco: None  . Alcohol Use: No    Review of Systems Constitutional: No fever/chills Eyes: No visual changes. ENT: No sore throat. Cardiovascular: Denies chest pain. Respiratory: Denies shortness of breath. He reports that the stomach hurts so much when she takes a deep breath that she doesn't want to breathe a lot. She denies any trouble breathing now. No wheezing. Gastrointestinal: See history of present illness Genitourinary: Negative for dysuria. Musculoskeletal: Negative for back pain. Skin: Negative for rash. Neurological: Negative for headaches, focal weakness or numbness.  10-point ROS otherwise negative.  ____________________________________________   PHYSICAL EXAM:  VITAL SIGNS: ED Triage Vitals  Enc Vitals Group     BP 03/28/15 1347 150/96  mmHg     Pulse Rate 03/28/15 1347 102     Resp 03/28/15 1347 18     Temp 03/28/15 1347 98.4 F (36.9 C)     Temp Source 03/28/15 1347 Oral     SpO2 03/28/15 1347 100 %     Weight 03/28/15 1347 230 lb (104.327 kg)     Height 03/28/15 1347  (1.753 m)     Head Cir --      Peak Flow --      Pain Score 03/28/15 1348 10     Pain Loc --      Pain Edu? --      Excl. in GC? --    Constitutional: Alert and oriented. Well appearing and in no acute distress. Eyes:  Conjunctivae are normal. PERRL. EOMI. Head: Atraumatic. Nose: No congestion/rhinnorhea. Mouth/Throat: Mucous membranes are moist.  Oropharynx non-erythematous. Neck: No stridor.   Cardiovascular: Normal rate, regular rhythm. Grossly normal heart sounds.  Good peripheral circulation. Respiratory: Normal respiratory effort.  No retractions. Lungs CTAB. Gastrointestinal: Soft but tender throughout the right mid and right lower abdomen without rebound or guarding. No distention. No abdominal bruits. No CVA tenderness. Musculoskeletal: No lower extremity tenderness nor edema.  No joint effusions. Neurologic:  Normal speech and language. No gross focal neurologic deficits are appreciated. No gait instability. Skin:  Skin is warm, dry and intact. No rash noted. Psychiatric: Mood and affect are normal. Speech and behavior are normal.  ____________________________________________   LABS (all labs ordered are listed, but only abnormal results are displayed)  Labs Reviewed  COMPREHENSIVE METABOLIC PANEL - Abnormal; Notable for the following:    Potassium 3.3 (*)    Glucose, Bld 112 (*)    AST 13 (*)    ALT 12 (*)    All other components within normal limits  CBC - Abnormal; Notable for the following:    WBC 30.1 (*)    All other components within normal limits  URINALYSIS COMPLETEWITH MICROSCOPIC ([facility] ONLY) - Abnormal; Notable for the following:    Color, Urine AMBER (*)    APPearance HAZY (*)    Ketones, ur 1+ (*)    Specific Gravity, Urine 1.035 (*)    Hgb urine dipstick 3+ (*)    Protein, ur 100 (*)    Leukocytes, UA TRACE (*)    Squamous Epithelial / LPF 6-30 (*)    All other components within normal limits  CHLAMYDIA/NGC RT PCR ([facility] ONLY)  CULTURE, BLOOD (ROUTINE X 2)  CULTURE, BLOOD (ROUTINE X 2)  LIPASE, BLOOD  POC URINE PREG, ED  POCT PREGNANCY, URINE    ____________________________________________  EKG   ____________________________________________  RADIOLOGY   CT Abdomen Pelvis W Contrast (Final result) Result time: 03/28/15 19:52:12   Final result by Rad Results In Interface (03/28/15 19:52:12)   Narrative:   CLINICAL DATA: Severe upper abdominal pain. Right upper and right lower quadrant pain. Nausea, vomiting and diarrhea. Symptoms for 2 days.  EXAM: CT ABDOMEN AND PELVIS WITH CONTRAST  TECHNIQUE: Multidetector CT imaging of the abdomen and pelvis was performed using the standard protocol following bolus administration of intravenous contrast.  CONTRAST: OMNIPAQUE IOHEXOL 300 MG/ML SOLN, 25mL OMNIPAQUE IOHEXOL 240 MG/ML SOLN  COMPARISON: Right upper quadrant ultrasound 08/29/2012  FINDINGS: Lower chest: The included lung bases are clear.  Liver: Elongated left lobe. No focal lesion.  Hepatobiliary: Postcholecystectomy with clips in the gallbladder fossa. Prominence of the common bile duct measures 11 mm at the porta hepatis,  likely sequela of cholecystectomy. No calcified choledocholithiasis.  Pancreas: No ductal dilatation or inflammation.  Spleen: Normal.  Adrenal glands: No nodule.  Kidneys: Symmetric renal enhancement. No hydronephrosis. No perinephric stranding or localizing renal abnormality.  Stomach/Bowel: Stomach physiologically distended. There are no dilated or thickened small bowel loops. Left-sided small bowel loops are fluid-filled but normal in caliber. Small volume of stool throughout the colon without colonic wall thickening. The appendix is tentatively identified.  Vascular/Lymphatic: Prominent left external iliac node measures 1.2 cm short axis. No inguinal adenopathy. No retroperitoneal adenopathy. Abdominal aorta is normal in caliber.  Reproductive: There is soft tissue induration and stranding about the pelvic fat planes. Small mesenteric thickening in the  pelvis. The uterus is normal in size. The ovaries are symmetric in size. Small amount of free fluid in the pelvic cul-de-sac, may be loculated.  Bladder: Minimally distended. No evidence of wall thickening.  Other: No free air. No upper abdominal ascites.  Musculoskeletal: There are no acute or suspicious osseous abnormalities.  IMPRESSION: 1. Findings suspicious for pelvic inflammatory disease with induration of the pelvic fat planes. Small amount of free fluid in the pelvic cul-de-sac may be loculated. 2. Postcholecystectomy with biliary prominence, likely postsurgical in etiology.   Electronically Signed By: Rubye Oaks M.D. On: 03/28/2015 19:52          DG Chest Port 1 View (Final result) Result time: 03/28/15 18:46:58   Final result by Rad Results In Interface (03/28/15 18:46:58)   Narrative:   CLINICAL DATA: 19 year old female with a history of severe abdominal pain, nausea, vomiting.  EXAM: PORTABLE CHEST 1 VIEW  COMPARISON: None.  FINDINGS: The heart size and mediastinal contours are within normal limits. Both lungs are clear. The visualized skeletal structures are unremarkable.  IMPRESSION: No evidence of acute cardiopulmonary disease.  Signed,  Yvone Neu. Loreta Ave, DO  [facility]  [facility]   Electronically Signed By: Gilmer Mor D.O. On: 03/28/2015 18:46    ____________________________________________   PROCEDURES  Procedure(s) performed: None  Critical Care performed: No  ____________________________________________   INITIAL IMPRESSION / ASSESSMENT AND PLAN / ED COURSE  Pertinent labs & imaging results that were available during my care of the patient were reviewed by me and considered in my medical decision making (see chart for details).  Patient presents for evaluation of severe upper abdominal pain. She does have significant tenderness on the right side  including right mid and right upper abdomen. No rebound or guarding, but doesn't does note pain with movement of the bed, possibly suggestive of peritonitis.  Labs indicated a white count of 30,000. No significant cardiopulmonary symptoms. I am concerned about the possibility of acute intra-abdominal process including infectious, perforation, GYN though I find torsion to be far less likely given symptoms for 2 days and reported upper abdominal symptoms and back pain.  ----------------------------------------- 6:25 PM on 03/28/2015 -----------------------------------------  Patient notes that she is in too much pain at this time to have pelvic exam. She is tearful, even after 2 doses of Dilaudid and a dose of morphine she appears to have severe pain. Currently awaiting CT scan, also x-ray to evaluate for free air. We will plan to do pelvic exam once her pain is better improved. She does tell me that she has not been sexually active for over 2 months and does not have any purulent or abnormal discharge except for period like bleeding but does have noted abnormal odor.  ----------------------------------------- 7:04 PM on 03/28/2015 -----------------------------------------  Patient  requiring significant amount of medication for pain. She continues to endorse severe upper abdominal pain and appears to be in significant pain. I ordered an additional dose of Dilaudid, updated patient family on her plan of care. Pelvic exam performed with Noreene Larsson RN, the patient has minimal tenderness without cervical motion tenderness, there is no clear adnexal mass or pain, there is mild bloody discharge consistent with her period, no purulent discharge. Sample sent. ____________________________________________   FINAL CLINICAL IMPRESSION(S) / ED DIAGNOSES  Final diagnoses:  Pelvic inflammatory disease      Sharyn Creamer, MD 03/28/15 2205

## 2015-03-29 DIAGNOSIS — A549 Gonococcal infection, unspecified: Secondary | ICD-10-CM | POA: Diagnosis present

## 2015-03-29 LAB — COMPREHENSIVE METABOLIC PANEL
ALK PHOS: 58 U/L (ref 38–126)
ALT: 10 U/L — ABNORMAL LOW (ref 14–54)
ANION GAP: 7 (ref 5–15)
AST: 9 U/L — ABNORMAL LOW (ref 15–41)
Albumin: 2.9 g/dL — ABNORMAL LOW (ref 3.5–5.0)
BILIRUBIN TOTAL: 1.1 mg/dL (ref 0.3–1.2)
BUN: 13 mg/dL (ref 6–20)
CALCIUM: 7.9 mg/dL — AB (ref 8.9–10.3)
CO2: 26 mmol/L (ref 22–32)
Chloride: 106 mmol/L (ref 101–111)
Creatinine, Ser: 0.76 mg/dL (ref 0.44–1.00)
Glucose, Bld: 99 mg/dL (ref 65–99)
POTASSIUM: 3.3 mmol/L — AB (ref 3.5–5.1)
Sodium: 139 mmol/L (ref 135–145)
TOTAL PROTEIN: 6.2 g/dL — AB (ref 6.5–8.1)

## 2015-03-29 LAB — CBC
HEMATOCRIT: 34.9 % — AB (ref 35.0–47.0)
HEMOGLOBIN: 11.6 g/dL — AB (ref 12.0–16.0)
MCH: 31.5 pg (ref 26.0–34.0)
MCHC: 33.2 g/dL (ref 32.0–36.0)
MCV: 94.8 fL (ref 80.0–100.0)
Platelets: 200 10*3/uL (ref 150–440)
RBC: 3.68 MIL/uL — AB (ref 3.80–5.20)
RDW: 12.9 % (ref 11.5–14.5)
WBC: 14.7 10*3/uL — AB (ref 3.6–11.0)

## 2015-03-29 MED ORDER — KETOROLAC TROMETHAMINE 30 MG/ML IJ SOLN
30.0000 mg | Freq: Once | INTRAMUSCULAR | Status: AC
  Administered 2015-03-29: 30 mg via INTRAVENOUS
  Filled 2015-03-29: qty 1

## 2015-03-29 MED ORDER — POTASSIUM CHLORIDE CRYS ER 20 MEQ PO TBCR
20.0000 meq | EXTENDED_RELEASE_TABLET | Freq: Two times a day (BID) | ORAL | Status: AC
  Administered 2015-03-29 – 2015-03-30 (×3): 20 meq via ORAL
  Filled 2015-03-29 (×3): qty 1

## 2015-03-29 MED ORDER — HYDROMORPHONE HCL 1 MG/ML IJ SOLN
1.0000 mg | INTRAMUSCULAR | Status: DC | PRN
  Administered 2015-03-29 – 2015-03-30 (×5): 1 mg via INTRAVENOUS
  Filled 2015-03-29 (×5): qty 1

## 2015-03-29 MED ORDER — OXYCODONE HCL 5 MG PO TABS
10.0000 mg | ORAL_TABLET | ORAL | Status: DC | PRN
  Administered 2015-03-29 – 2015-03-30 (×4): 10 mg via ORAL
  Filled 2015-03-29 (×4): qty 2
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
## 2015-03-29 NOTE — Plan of Care (Signed)
Problem: Nutrition: Goal: Adequate nutrition will be maintained Outcome: Not Progressing Pt. Is still not eating Reg. Diet as yet.
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
## 2015-03-29 NOTE — Progress Notes (Addendum)
Patient ID: Angela Flynn, female   DOB: 20-Sep-1995, 19 y.o.   MRN: [id number] Daily [facility]cology Progress Note Angela RocaSierra Elena Flynn  [id number]  HD#2  Subjective:  Overnight Events: None, afebrile Complaints: still with abdominal pain, though improved from admission. She denies: fevers, chills, chest pain, trouble breathing, nausea, vomiting.  She has tolerated: small amounts of liquid and solid foods She reports her pain is well controlled with IV dilaudid.   She is ambulating and is voiding with some pain with micturition.  Objective:  Most recent vitals Temp: 98.2 F (36.8 C)  BP: 122/60 mmHg  Pulse Rate: 96  Resp: 20  SpO2: 97 %   Vitals Range over 24 hours BP  Min: 111/54  Max: 150/96 Temp  Avg: 98.2 F (36.8 C)  Min: 97.8 F (36.6 C)  Max: 98.4 F (36.9 C) Pulse  Avg: 94.3  Min: 86  Max: 102 SpO2  Avg: 99 %  Min: 97 %  Max: 100 %   Physical Exam General: alert, well appearing, and in no distress Heart: regular rate and rhythm, no murmurs Lungs: clear to auscultation, no wheezes, rales or rhonchi, symmetric air entry Abdomen: abdomen is soft without significant tenderness, masses, organomegaly or guarding. Extremities: no redness or tenderness in the calves or thighs, no edema, SCDs in place  AM Labs Recent Labs     03/28/15  1350  03/29/15  0457  WBC  30.1*  14.7*  HGB  13.7  11.6*  HCT  41.7  34.9*  PLT  250  200  K  3.3*  3.3*  CREATININE  0.57  0.76    Lab Results  Component Value Date   NGONORRHOEAE DETECTED* 03/28/2015   Assessment:  Angela Flynn is a 19 y.o. female HD#2 s/p admitted with PID with positive testing for gonorrhea, improving. Patient Active Problem List   Diagnosis Date Noted  . Gonorrhea 03/29/2015  . Pelvic inflammatory disease 03/28/2015  . SIRS (systemic inflammatory response syndrome) (HCC) 03/28/2015  . Leukocytosis 03/28/2015  . Hypokalemia 03/28/2015   Plan:  Pain Control:  Continue PRN IV dilaudid.  Will  switch to PO pain medication today with oxycodone. Heme: Leukocytosis improved with IV antibiotics ID: PID treatment per CDC guidelines with cefoxitin and doxy, which will cover positive test for gonorrhea. Testing for C. Diff given report of diarrhea for two days with WBC count 30k on admission. No episodes of diarrhea since admission.  Enteric precautions. GI: zofran for nausea GU:  Urine culture pending Nutrition:  Regular diet Hypokalemia: replete with KCl PO today. IVF: NS with 20KCl at 16725ml/hour until tolerating PO better.  Still resuscitating patient as she came in with 3/4 SIRS criteria. DVT Prophylaxis:  SCDs Activity: as tolerated Anticipated Discharge: once clinically improved  Conard NovakJackson, Merian Wroe D, MD  03/29/2015 10:26 AM

## 2015-03-30 LAB — POTASSIUM: Potassium: 3.5 mmol/L (ref 3.5–5.1)

## 2015-03-30 LAB — URINE CULTURE: Special Requests: NORMAL

## 2015-03-30 LAB — CBC
HEMATOCRIT: 36.2 % (ref 35.0–47.0)
HEMOGLOBIN: 11.9 g/dL — AB (ref 12.0–16.0)
MCH: 30.7 pg (ref 26.0–34.0)
MCHC: 32.8 g/dL (ref 32.0–36.0)
MCV: 93.7 fL (ref 80.0–100.0)
Platelets: 231 10*3/uL (ref 150–440)
RBC: 3.86 MIL/uL (ref 3.80–5.20)
RDW: 12.7 % (ref 11.5–14.5)
WBC: 9.9 10*3/uL (ref 3.6–11.0)

## 2015-03-30 MED ORDER — POTASSIUM CHLORIDE CRYS ER 20 MEQ PO TBCR: 20.0000 meq | EXTENDED_RELEASE_TABLET | Freq: Two times a day (BID) | ORAL | Status: DC

## 2015-03-30 MED ORDER — IBUPROFEN 600 MG PO TABS
600.0000 mg | ORAL_TABLET | ORAL | Status: DC | PRN
  Administered 2015-03-30 – 2015-03-31 (×6): 600 mg via ORAL
  Filled 2015-03-30 (×6): qty 1

## 2015-03-30 MED ORDER — OXYCODONE HCL 5 MG PO TABS
10.0000 mg | ORAL_TABLET | ORAL | Status: DC | PRN
  Administered 2015-03-30 (×2): 10 mg via ORAL
  Filled 2015-03-30 (×3): qty 2

## 2015-03-30 MED ORDER — OXYCODONE-ACETAMINOPHEN 5-325 MG PO TABS
2.0000 | ORAL_TABLET | ORAL | Status: DC | PRN
  Administered 2015-03-30 – 2015-03-31 (×7): 2 via ORAL
  Filled 2015-03-30 (×2): qty 2
  Filled 2015-03-30: qty 1
  Filled 2015-03-30 (×5): qty 2

## 2015-03-30 NOTE — Progress Notes (Signed)
Patient ID: Angela Flynn Parmer, female   DOB: Aug 02, 1995, 19 y.o.   MRN: [id number] Daily [facility] Gynecology Progress Note Angela Flynn Yahr  [id number]  HD#3 Subjective:  Overnight Events: afebrile Complaints: still with mostly right-sided abdominal pain, improving. She denies: fevers, chills, chest pain, trouble breathing, nausea, vomiting.  She has tolerated: small amounts of liquid and solid foods She is ambulating and is voiding.   Objective:  Most recent vitals Temp: 98.5 F (36.9 C)  BP: (!) 140/55 mmHg  Pulse Rate: 77  Resp: 20  SpO2: 98 %   Vitals Range over 24 hours BP  Min: 118/65  Max: 140/55 Temp  Avg: 98.5 F (36.9 C)  Min: 98.4 F (36.9 C)  Max: 98.7 F (37.1 C) Pulse  Avg: 81  Min: 77  Max: 85 SpO2  Avg: 98.7 %  Min: 97 %  Max: 100 %   Physical Exam General: alert, well appearing, and in no distress Heart: regular rate and rhythm, no murmurs Lungs: clear to auscultation, no wheezes, rales or rhonchi, symmetric air entry Abdomen: abdomen is soft with mild RLQ T to deep palpation, and otherwise without significant tenderness, masses, organomegaly or guarding. Extremities: no redness or tenderness in the calves or thighs, no edema, SCDs in place  AM Labs Recent Labs     03/28/15  1350  03/29/15  0457  03/30/15  0622  WBC  30.1*  14.7*  9.9  HGB  13.7  11.6*  11.9*  HCT  41.7  34.9*  36.2  PLT  250  200  231  K  3.3*  3.3*  3.5  CREATININE  0.57  0.76   --     Lab Results  Component Value Date   NGONORRHOEAE DETECTED* 03/28/2015   Assessment:  Angela Flynn Kottke is a 19 y.o. female HD#3 s/p admitted with PID with positive testing for gonorrhea, improving. Patient Active Problem List   Diagnosis Date Noted  . Gonorrhea 03/29/2015  . Pelvic inflammatory disease 03/28/2015  . SIRS (systemic inflammatory response syndrome) (HCC) 03/28/2015  . Leukocytosis 03/28/2015  . Hypokalemia 03/28/2015   Plan:  Pain Control: Will continue transition to PO  pain medication today with oxycodone, ibuprofen. Counseled on plan for home mgt with these meds, and that no surgical indication is present at this time.  Needf or antibiotics and analgesics as long term plan discussed. Heme: Leukocytosis improved with IV antibiotics ID: PID treatment per CDC guidelines with cefoxitin and doxy, which will cover positive test for gonorrhea.    No episodes of diarrhea since admission, so will discontinue enteric precautions. GI: zofran for nausea Nutrition:  Regular diet Hypokalemia: replete with KCl PO. IVF: saline lock; transition to po's only so can plan discharge DVT Prophylaxis:  SCDs Activity: as tolerated Anticipated Discharge: once clinically improved  Letitia LibraHARRIS,Luisfernando Brightwell PAUL, MD  03/30/2015 10:19 AM

## 2015-03-31 MED ORDER — IBUPROFEN 600 MG PO TABS: 600.0000 mg | ORAL_TABLET | ORAL | Status: DC | PRN

## 2015-03-31 MED ORDER — ONDANSETRON HCL 4 MG PO TABS: 4.0000 mg | ORAL_TABLET | Freq: Four times a day (QID) | ORAL | Status: DC | PRN

## 2015-03-31 MED ORDER — DOXYCYCLINE HYCLATE 100 MG PO TABS
100.0000 mg | ORAL_TABLET | Freq: Two times a day (BID) | ORAL | Status: DC
Start: 2015-03-31 — End: 2018-02-21

## 2015-03-31 MED ORDER — OXYCODONE-ACETAMINOPHEN 5-325 MG PO TABS: 2.0000 | ORAL_TABLET | ORAL | Status: DC | PRN

## 2015-03-31 NOTE — Progress Notes (Signed)
Patient understands all discharge instructions and the need to make follow up appointments. Patient discharge via wheelchair with auxillary. 

## 2015-03-31 NOTE — Discharge Instructions (Signed)

## 2015-03-31 NOTE — Discharge Summary (Signed)
Physician Discharge Summary  Patient ID: Angela Flynn MRN: [id number] DOB/AGE: 12/11/1995 19 y.o.  Admit date: 03/28/2015 Discharge date: 03/31/2015  Admission Diagnoses:  Discharge Diagnoses:  Principal Problem:   Pelvic inflammatory disease Active Problems:   SIRS (systemic inflammatory response syndrome) (HCC)   Leukocytosis   Hypokalemia   Gonorrhea   Discharged Condition: good  Hospital Course: Pt seen for pain and elevated white count, positive culture for gonorrhea.  Ultrasound and imaging negative for abcess.  Pt treated with antibiotics, first IV then PO.  As pain is gradually improving, will continue management as outpatient, with follow up arranged.  Will continue Doxycycline, as well as Zofran and analgesics.  Consults: None  Significant Diagnostic Studies: labs: improving WBC, see other results; and radiology: CT and US normal  Treatments: antibiotics: Doxycycline and Cefoxitin and analgesia: IV and po therapies  Discharge Exam: Blood pressure 124/79, pulse 83, temperature 98.3 F (36.8 C), temperature source Oral, resp. rate 18, height 5\' 9"  (1.753 m), weight 230 lb (104.327 kg), last menstrual period 03/28/2015, SpO2 98 %. General appearance: alert, cooperative and no distress Back: negative, no CVAT GI: abnormal findings:  tender in RLQ (mild), no distension or guarding Skin: Skin color, texture, turgor normal. No rashes or lesions  Disposition: 01-Home or Self Care     Medication List    STOP taking these medications        HYDROcodone-acetaminophen 5-325 MG tablet  Commonly known as:  NORCO/VICODIN     traMADol 50 MG tablet  Commonly known as:  ULTRAM      TAKE these medications        acyclovir 400 MG tablet  Commonly known as:  ZOVIRAX  Take 1,200 mg by mouth daily.     doxycycline 100 MG tablet  Commonly known as:  VIBRA-TABS  Take 1 tablet (100 mg total) by mouth every 12 (twelve) hours.     ibuprofen 600 MG tablet  Commonly  known as:  ADVIL,MOTRIN  Take 1 tablet (600 mg total) by mouth every 4 (four) hours as needed for mild pain or moderate pain.     ondansetron 4 MG tablet  Commonly known as:  ZOFRAN  Take 1 tablet (4 mg total) by mouth every 6 (six) hours as needed for nausea.     oxyCODONE-acetaminophen 5-325 MG tablet  Commonly known as:  PERCOCET/ROXICET  Take 2 tablets by mouth every 4 (four) hours as needed for moderate pain or severe pain.           Follow-up Information    Follow up with Conard NovakJackson, Stephen D, MD. Schedule an appointment as soon as possible for a visit in 6 days.   Specialty:  Obstetrics and Gynecology   Why:  For Follow Up   Contact information:   [address] [place] [phone]       Signed: Letitia LibraHARRIS,ROBERT PAUL 03/31/2015, 10:00 AM

## 2015-04-03 LAB — CULTURE, BLOOD (ROUTINE X 2)
CULTURE: NO GROWTH
Culture: NO GROWTH

## 2015-08-14 ENCOUNTER — Emergency Department
Admission: EM | Admit: 2015-08-14 | Discharge: 2015-08-14 | Disposition: A | Discharge status: Home / Self Care | Payer: Self-pay | Attending: Emergency Medicine | Admitting: Emergency Medicine

## 2015-08-14 DIAGNOSIS — T7840XA Allergy, unspecified, initial encounter: Secondary | ICD-10-CM | POA: Insufficient documentation

## 2015-08-14 DIAGNOSIS — L03317 Cellulitis of buttock: Secondary | ICD-10-CM | POA: Insufficient documentation

## 2015-08-14 DIAGNOSIS — F17200 Nicotine dependence, unspecified, uncomplicated: Secondary | ICD-10-CM | POA: Insufficient documentation

## 2015-08-14 DIAGNOSIS — F1290 Cannabis use, unspecified, uncomplicated: Secondary | ICD-10-CM | POA: Insufficient documentation

## 2015-08-14 DIAGNOSIS — Z791 Long term (current) use of non-steroidal anti-inflammatories (NSAID): Secondary | ICD-10-CM | POA: Insufficient documentation

## 2015-08-14 LAB — CBC WITH DIFFERENTIAL/PLATELET
BASOS ABS: 0 10*3/uL (ref 0–0.1)
Eosinophils Absolute: 0.1 10*3/uL (ref 0–0.7)
HCT: 47.9 % — ABNORMAL HIGH (ref 35.0–47.0)
Hemoglobin: 16.1 g/dL — ABNORMAL HIGH (ref 12.0–16.0)
Lymphs Abs: 2.4 10*3/uL (ref 1.0–3.6)
MCH: 30.4 pg (ref 26.0–34.0)
MCHC: 33.5 g/dL (ref 32.0–36.0)
MCV: 90.8 fL (ref 80.0–100.0)
Monocytes Absolute: 0.4 10*3/uL (ref 0.2–0.9)
Monocytes Relative: 3 %
NEUTROS ABS: 12 10*3/uL — AB (ref 1.4–6.5)
Neutrophils Relative %: 81 %
PLATELETS: 297 10*3/uL (ref 150–440)
RBC: 5.28 MIL/uL — AB (ref 3.80–5.20)
RDW: 13.2 % (ref 11.5–14.5)
WBC: 14.9 10*3/uL — AB (ref 3.6–11.0)

## 2015-08-14 LAB — COMPREHENSIVE METABOLIC PANEL
ALBUMIN: 3.9 g/dL (ref 3.5–5.0)
ALK PHOS: 63 U/L (ref 38–126)
ALT: 13 U/L — AB (ref 14–54)
AST: 18 U/L (ref 15–41)
Anion gap: 11 (ref 5–15)
BUN: 13 mg/dL (ref 6–20)
CO2: 21 mmol/L — AB (ref 22–32)
Calcium: 9 mg/dL (ref 8.9–10.3)
Chloride: 106 mmol/L (ref 101–111)
Creatinine, Ser: 0.61 mg/dL (ref 0.44–1.00)
GFR calc Af Amer: >60 mL/min (ref >60)
GFR calc non Af Amer: >60 mL/min (ref >60)
Glucose, Bld: 118 mg/dL — ABNORMAL HIGH (ref 65–99)
Potassium: 3.8 mmol/L (ref 3.5–5.1)
SODIUM: 138 mmol/L (ref 135–145)
TOTAL PROTEIN: 6.9 g/dL (ref 6.5–8.1)
Total Bilirubin: 1.4 mg/dL — ABNORMAL HIGH (ref 0.3–1.2)

## 2015-08-14 LAB — ACETAMINOPHEN LEVEL: Acetaminophen (Tylenol), Serum: <10 ug/mL — ABNORMAL LOW (ref 10–30)

## 2015-08-14 MED ORDER — CETIRIZINE HCL 10 MG PO TABS: 10.0000 mg | ORAL_TABLET | Freq: Every day | ORAL | Status: DC

## 2015-08-14 MED ORDER — DIPHENHYDRAMINE HCL 50 MG/ML IJ SOLN
50.0000 mg | Freq: Once | INTRAMUSCULAR | Status: DC
  Filled 2015-08-14: qty 1

## 2015-08-14 MED ORDER — METHYLPREDNISOLONE SODIUM SUCC 125 MG IJ SOLR
125.0000 mg | Freq: Once | INTRAMUSCULAR | Status: AC
  Administered 2015-08-14: 125 mg via INTRAVENOUS
  Filled 2015-08-14: qty 2

## 2015-08-14 MED ORDER — ONDANSETRON HCL 4 MG/2ML IJ SOLN
4.0000 mg | Freq: Once | INTRAMUSCULAR | Status: AC
  Administered 2015-08-14: 4 mg via INTRAVENOUS
  Filled 2015-08-14: qty 2

## 2015-08-14 MED ORDER — FAMOTIDINE IN NACL 20-0.9 MG/50ML-% IV SOLN
20.0000 mg | Freq: Once | INTRAVENOUS | Status: AC
  Administered 2015-08-14: 20 mg via INTRAVENOUS
  Filled 2015-08-14: qty 50

## 2015-08-14 MED ORDER — CEPHALEXIN 500 MG PO CAPS: 500.0000 mg | ORAL_CAPSULE | Freq: Four times a day (QID) | ORAL | Status: DC

## 2015-08-14 MED ORDER — PREDNISONE 10 MG PO TABS: 10.0000 mg | ORAL_TABLET | ORAL | Status: DC

## 2015-08-14 MED ORDER — FAMOTIDINE 20 MG PO TABS: 20.0000 mg | ORAL_TABLET | Freq: Two times a day (BID) | ORAL | Status: DC

## 2015-08-14 MED ORDER — DIPHENHYDRAMINE HCL 50 MG/ML IJ SOLN
50.0000 mg | Freq: Once | INTRAMUSCULAR | Status: AC
  Administered 2015-08-14: 50 mg via INTRAVENOUS
  Filled 2015-08-14: qty 1

## 2015-08-14 MED ORDER — DIPHENHYDRAMINE HCL 50 MG/ML IJ SOLN
50.0000 mg | Freq: Once | INTRAMUSCULAR | Status: AC
  Administered 2015-08-14: 50 mg via INTRAVENOUS

## 2015-08-14 NOTE — Discharge Instructions (Signed)
Cellulitis Cellulitis is an infection of the skin and the tissue beneath it. The infected area is usually red and tender. Cellulitis occurs most often in the arms and lower legs.  CAUSES  Cellulitis is caused by bacteria that enter the skin through cracks or cuts in the skin. The most common types of bacteria that cause cellulitis are staphylococci and streptococci. SIGNS AND SYMPTOMS   Redness and warmth.  Swelling.  Tenderness or pain.  Fever. DIAGNOSIS  Your health care provider can usually determine what is wrong based on a physical exam. Blood tests may also be done. TREATMENT  Treatment usually involves taking an antibiotic medicine. HOME CARE INSTRUCTIONS   Take your antibiotic medicine as directed by your health care provider. Finish the antibiotic even if you start to feel better.  Keep the infected arm or leg elevated to reduce swelling.  Apply a warm cloth to the affected area up to 4 times per day to relieve pain.  Take medicines only as directed by your health care provider.  Keep all follow-up visits as directed by your health care provider. SEEK MEDICAL CARE IF:   You notice red streaks coming from the infected area.  Your red area gets larger or turns dark in color.  Your bone or joint underneath the infected area becomes painful after the skin has healed.  Your infection returns in the same area or another area.  You notice a swollen bump in the infected area.  You develop new symptoms.  You have a fever. SEEK IMMEDIATE MEDICAL CARE IF:   You feel very sleepy.  You develop vomiting or diarrhea.  You have a general ill feeling (malaise) with muscle aches and pains.   This information is not intended to replace advice given to you by your health care provider. Make sure you discuss any questions you have with your health care provider.   Document Released: 01/05/2005 Document Revised: 12/17/2014 Document Reviewed: 06/13/2011 Elsevier Interactive  Patient Education 2016 [employer]  Allergies An allergy is an abnormal reaction to a substance by the body's defense system (immune system). Allergies can develop at any age. WHAT CAUSES ALLERGIES? An allergic reaction happens when the immune system mistakenly reacts to a normally harmless substance, called an allergen, as if it were harmful. The immune system releases antibodies to fight the substance. Antibodies eventually release a chemical called histamine into the bloodstream. The release of histamine is meant to protect the body from infection, but it also causes discomfort. An allergic reaction can be triggered by:  Eating an allergen.  Inhaling an allergen.  Touching an allergen. WHAT TYPES OF ALLERGIES ARE THERE? There are many types of allergies. Common types include:  Seasonal allergies. People with this type of allergy are usually allergic to substances that are only present during certain seasons, such as molds and pollens.  Food allergies.  Drug allergies.  Insect allergies.  Animal dander allergies. WHAT ARE SYMPTOMS OF ALLERGIES? Possible allergy symptoms include:  Swelling of the lips, face, tongue, mouth, or throat.  Sneezing, coughing, or wheezing.  Nasal congestion.  Tingling in the mouth.  Rash.  Itching.  Itchy, red, swollen areas of skin (hives).  Watery eyes.  Vomiting.  Diarrhea.  Dizziness.  Lightheadedness.  Fainting.  Trouble breathing or swallowing.  Chest tightness.  Rapid heartbeat. HOW ARE ALLERGIES DIAGNOSED? Allergies are diagnosed with a medical and family history and one or more of the following:  Skin tests.  Blood tests.  A food diary. A  food diary is a record of all the foods and drinks you have in a day and of all the symptoms you experience.  The results of an elimination diet. An elimination diet involves eliminating foods from your diet and then adding them back in one by one to find out if a certain  food causes an allergic reaction. HOW ARE ALLERGIES TREATED? There is no cure for allergies, but allergic reactions can be treated with medicine. Severe reactions usually need to be treated at a hospital. HOW CAN REACTIONS BE PREVENTED? The best way to prevent an allergic reaction is by avoiding the substance you are allergic to. Allergy shots and medicines can also help prevent reactions in some cases. People with severe allergic reactions may be able to prevent a life-threatening reaction called anaphylaxis with a medicine given right after exposure to the allergen.   This information is not intended to replace advice given to you by your health care provider. Make sure you discuss any questions you have with your health care provider.   Document Released: 06/21/2002 Document Revised: 04/18/2014 Document Reviewed: 01/07/2014 Elsevier Interactive Patient Education [employer]

## 2015-08-14 NOTE — ED Provider Notes (Signed)
[facility] Emergency Department Provider Note  ____________________________________________  Time seen: Approximately 6:33 PM  I have reviewed the triage vital signs and the nursing notes.   HISTORY  Chief Complaint Urticaria    HPI Angela Flynn is a 20 y.o. female complaining of a rash x 1 day. She first noticed the rash on her legs while at a friends house sitting on her bed and it has since progressed to covering the entire body. She admits to pain she describes as tingling and burning, intense pruritis, numbness, vomiting, diarrhea, HA, swelling and numbness in the lips and eye lids. Denies difficulty swallowing, breathing, cough or changes in vision. She has taken 4000mg  ibuprofen, "a few" tylenol, and "a box and a half" of benadryl and has had no relief. She denies any similar occurences but notes she sometimes fines "a few bumps" after sitting on the same friends bed thought never this severe.    Past Medical History  Diagnosis Date  . Herpes genitalis     Patient Active Problem List   Diagnosis Date Noted  . Gonorrhea 03/29/2015  . Pelvic inflammatory disease 03/28/2015  . SIRS (systemic inflammatory response syndrome) (HCC) 03/28/2015  . Leukocytosis 03/28/2015  . Hypokalemia 03/28/2015    Past Surgical History  Procedure Laterality Date  . Cholecystectomy, laparoscopic      Current Outpatient Rx  Name  Route  Sig  Dispense  Refill  . acyclovir (ZOVIRAX) 400 MG tablet   Oral   Take 1,200 mg by mouth daily.      0   . cephALEXin (KEFLEX) 500 MG capsule   Oral   Take 1 capsule (500 mg total) by mouth 4 (four) times daily.   28 capsule   0   . cetirizine (ZYRTEC) 10 MG tablet   Oral   Take 1 tablet (10 mg total) by mouth daily.   30 tablet   0   . doxycycline (VIBRA-TABS) 100 MG tablet   Oral   Take 1 tablet (100 mg total) by mouth every 12 (twelve) hours.   24 tablet   0   . famotidine (PEPCID) 20 MG tablet    Oral   Take 1 tablet (20 mg total) by mouth 2 (two) times daily.   30 tablet   0   . ibuprofen (ADVIL,MOTRIN) 600 MG tablet   Oral   Take 1 tablet (600 mg total) by mouth every 4 (four) hours as needed for mild pain or moderate pain.   80 tablet   2   . ondansetron (ZOFRAN) 4 MG tablet   Oral   Take 1 tablet (4 mg total) by mouth every 6 (six) hours as needed for nausea.   30 tablet   0   . oxyCODONE-acetaminophen (PERCOCET/ROXICET) 5-325 MG tablet   Oral   Take 2 tablets by mouth every 4 (four) hours as needed for moderate pain or severe pain.   60 tablet   0   . predniSONE (DELTASONE) 10 MG tablet   Oral   Take 1 tablet (10 mg total) by mouth as directed.   21 tablet   0     Take on a daily basis of 6, 5, 4, 3, 2, 1     Allergies Review of patient's allergies indicates no known allergies.  No family history on file.  Social History Social History  Substance Use Topics  . Smoking status: Current Every Day Smoker  . Smokeless tobacco: None  . Alcohol Use: No  Review of Systems  Constitutional: No fever/chills Eyes: No visual changes. No discharge ENT: No upper respiratory complaints. Cardiovascular: no chest pain. Respiratory: no cough. No SOB. Gastrointestinal: No abdominal pain.  Positive for nausea, vomiting, diarrhea.  No constipation. Musculoskeletal: Negative for musculoskeletal pain. Skin: Negative for abrasions, lacerations, ecchymosis. Positive for rash, erythema, and severe uritcaria Neurological: Negative for headaches, focal weakness or numbness. 10-point ROS otherwise negative.  ____________________________________________   PHYSICAL EXAM:  VITAL SIGNS: ED Triage Vitals  Enc Vitals Group     BP 08/14/15 1713 130/97 mmHg     Pulse Rate 08/14/15 1713 104     Resp 08/14/15 1713 20     Temp 08/14/15 1713 98.1 F (36.7 C)     Temp Source 08/14/15 1713 Oral     SpO2 08/14/15 1713 97 %     Weight 08/14/15 1713 230 lb (104.327 kg)      Height 08/14/15 1713 5\' 9"  (1.753 m)     Head Cir --      Peak Flow --      Pain Score 08/14/15 1714 8     Pain Loc --      Pain Edu? --      Excl. in GC? --      Constitutional: Alert and oriented. Patient appears anxious and uncomfortable though in no respiratory or cardiac distress.  Eyes: Conjunctivae are normal. EOMI. Swelling of the upper and lower eye lids bilaterally Head: Atraumatic. ENT:      Ears:       Nose: No congestion/rhinnorhea.      Mouth/Throat: Mucous membranes are moist.  Neck: No stridor.  Hematological/Lymphatic/Immunilogical: No cervical lymphadenopathy. Cardiovascular: Good peripheral circulation. Respiratory: Normal respiratory effort without tachypnea or retractions. Musculoskeletal: Full range of motion to all extremities. No gross deformities appreciated. Neurologic:  Normal speech and language. No gross focal neurologic deficits are appreciated.  Skin:  Skin is warm, dry and intact. Erythematous wheals noted covering 60% BSA and not localized. Lesions are pruritic well demarcated, raised and flat and of differing size and shape with irregular borders, no central clearing is present. Patient also has a erythematous lesion to the right buttocks. This is firm to palpation. Tender to palpation. No fluctuance. No drainage. Psychiatric: Mood and affect are normal. Speech and behavior are normal. Patient exhibits appropriate insight and judgement.   ____________________________________________   LABS (all labs ordered are listed, but only abnormal results are displayed)  Labs Reviewed  ACETAMINOPHEN LEVEL - Abnormal; Notable for the following:    Acetaminophen (Tylenol), Serum <10 (*)    All other components within normal limits  COMPREHENSIVE METABOLIC PANEL - Abnormal; Notable for the following:    CO2 21 (*)    Glucose, Bld 118 (*)    ALT 13 (*)    Total Bilirubin 1.4 (*)    All other components within normal limits  CBC WITH DIFFERENTIAL/PLATELET -  Abnormal; Notable for the following:    WBC 14.9 (*)    RBC 5.28 (*)    Hemoglobin 16.1 (*)    HCT 47.9 (*)    Neutro Abs 12.0 (*)    All other components within normal limits   ____________________________________________  EKG   ____________________________________________  RADIOLOGY   No results found.  ____________________________________________    PROCEDURES  Procedure(s) performed:       Medications  diphenhydrAMINE (BENADRYL) injection 50 mg (not administered)  diphenhydrAMINE (BENADRYL) injection 50 mg (50 mg Intravenous Given 08/14/15 1913)  methylPREDNISolone sodium succinate (SOLU-MEDROL)  125 mg/2 mL injection 125 mg (125 mg Intravenous Given 08/14/15 1913)  famotidine (PEPCID) IVPB 20 mg premix (0 mg Intravenous Stopped 08/14/15 1943)  ondansetron (ZOFRAN) injection 4 mg (4 mg Intravenous Given 08/14/15 1954)     ____________________________________________   INITIAL IMPRESSION / ASSESSMENT AND PLAN / ED COURSE  Pertinent labs & imaging results that were available during my care of the patient were reviewed by me and considered in my medical decision making (see chart for details).  Patient's diagnosis is consistent with Allergic reaction and cellulitis of the right buttocks. Patient presented with diffuse wheals, pruritus, but no respiratory distress. Patient was given IV diphenhydramine, Solu-Medrol, and famotidine with good improvement. While wheals are improving patient is still. It can second of September diphenhydramine as administered here in the emergency department. Patient does have a area of cellulitis to the right buttocks with no fluctuance and no need for incision and drainage at this time. Patient will be encouraged to continue Hydramine use at home, and will be placed on steroids and oral famotidine for continued improvement of rash. Patient will be placed on antibiotics for cellulitis to the buttocks. Patient was given strict ED precautions to  return to the emergency department for any increase or worsening of symptoms. Patient will follow-up with primary care as needed.     ____________________________________________  FINAL CLINICAL IMPRESSION(S) / ED DIAGNOSES  Final diagnoses:  Allergic reaction, initial encounter  Cellulitis of buttock      NEW MEDICATIONS STARTED DURING THIS VISIT:  New Prescriptions   CEPHALEXIN (KEFLEX) 500 MG CAPSULE    Take 1 capsule (500 mg total) by mouth 4 (four) times daily.   CETIRIZINE (ZYRTEC) 10 MG TABLET    Take 1 tablet (10 mg total) by mouth daily.   FAMOTIDINE (PEPCID) 20 MG TABLET    Take 1 tablet (20 mg total) by mouth 2 (two) times daily.   PREDNISONE (DELTASONE) 10 MG TABLET    Take 1 tablet (10 mg total) by mouth as directed.        This chart was dictated using voice recognition software/Dragon. Despite best efforts to proofread, errors can occur which can change the meaning. Any change was purely unintentional.   Racheal Patches, PA-C 08/14/15 2106  Emily Filbert, MD 08/14/15 [phone]

## 2015-08-14 NOTE — ED Notes (Signed)
Pt reports rash/urticaria over bilateral thighs, arms, armpits, chest, neck back, buttocks, vaginal area and face. Pt reports the rash has spread to the face since she arrived in ED. Pt c/o of numbness in chin and hands. Pt c/o of pain/itcing in all areas of rash.

## 2015-08-14 NOTE — ED Notes (Signed)
Pt reports to ED w/ c/o full body rash that started last night.  Pt sts that she was seen EMS, she called out for SOB and full body pain.  Pt sts that she took benadryl w/ no relief.  Pt sts that she does not know what brought on rash, "I didn't do anything out of the ordinary", pt denies bites.  Pt tearful in triage, able to talk in complete sentences w/ no difficulty.

## 2017-08-11 ENCOUNTER — Other Ambulatory Visit: Payer: Self-pay

## 2017-08-11 ENCOUNTER — Encounter: Payer: Self-pay | Admitting: Emergency Medicine

## 2017-08-11 DIAGNOSIS — Z87891 Personal history of nicotine dependence: Secondary | ICD-10-CM | POA: Insufficient documentation

## 2017-08-11 DIAGNOSIS — N83202 Unspecified ovarian cyst, left side: Secondary | ICD-10-CM | POA: Insufficient documentation

## 2017-08-11 DIAGNOSIS — K529 Noninfective gastroenteritis and colitis, unspecified: Secondary | ICD-10-CM | POA: Insufficient documentation

## 2017-08-11 DIAGNOSIS — N390 Urinary tract infection, site not specified: Secondary | ICD-10-CM | POA: Insufficient documentation

## 2017-08-11 DIAGNOSIS — Z79899 Other long term (current) drug therapy: Secondary | ICD-10-CM | POA: Insufficient documentation

## 2017-08-11 LAB — COMPREHENSIVE METABOLIC PANEL
ALT: 17 U/L (ref 14–54)
AST: 23 U/L (ref 15–41)
Albumin: 4.5 g/dL (ref 3.5–5.0)
Alkaline Phosphatase: 85 U/L (ref 38–126)
Anion gap: 11 (ref 5–15)
BILIRUBIN TOTAL: 1.1 mg/dL (ref 0.3–1.2)
BUN: 13 mg/dL (ref 6–20)
CO2: 20 mmol/L — ABNORMAL LOW (ref 22–32)
CREATININE: 0.6 mg/dL (ref 0.44–1.00)
Calcium: 9.4 mg/dL (ref 8.9–10.3)
Chloride: 105 mmol/L (ref 101–111)
GFR calc non Af Amer: >60 mL/min (ref >60)
Glucose, Bld: 114 mg/dL — ABNORMAL HIGH (ref 65–99)
POTASSIUM: 3.3 mmol/L — AB (ref 3.5–5.1)
Sodium: 136 mmol/L (ref 135–145)
TOTAL PROTEIN: 8.3 g/dL — AB (ref 6.5–8.1)

## 2017-08-11 LAB — CBC
HCT: 42.3 % (ref 35.0–47.0)
Hemoglobin: 14.5 g/dL (ref 12.0–16.0)
MCH: 30.7 pg (ref 26.0–34.0)
MCHC: 34.3 g/dL (ref 32.0–36.0)
MCV: 89.5 fL (ref 80.0–100.0)
PLATELETS: 315 10*3/uL (ref 150–440)
RBC: 4.72 MIL/uL (ref 3.80–5.20)
RDW: 12.5 % (ref 11.5–14.5)
WBC: 15.7 10*3/uL — AB (ref 3.6–11.0)

## 2017-08-11 LAB — LIPASE, BLOOD: LIPASE: 20 U/L (ref 11–51)

## 2017-08-11 MED ORDER — MORPHINE SULFATE (PF) 4 MG/ML IV SOLN
4.0000 mg | Freq: Once | INTRAVENOUS | Status: AC
  Administered 2017-08-11: 4 mg via INTRAVENOUS

## 2017-08-11 MED ORDER — ONDANSETRON HCL 4 MG/2ML IJ SOLN
4.0000 mg | Freq: Once | INTRAMUSCULAR | Status: AC | PRN
  Administered 2017-08-11: 4 mg via INTRAVENOUS
  Filled 2017-08-11: qty 2

## 2017-08-11 MED ORDER — FENTANYL CITRATE (PF) 100 MCG/2ML IJ SOLN
50.0000 ug | INTRAMUSCULAR | Status: DC | PRN
  Administered 2017-08-11: 50 ug via INTRAVENOUS

## 2017-08-11 MED ORDER — FENTANYL CITRATE (PF) 100 MCG/2ML IJ SOLN
INTRAMUSCULAR | Status: AC
  Filled 2017-08-11: qty 2

## 2017-08-11 MED ORDER — MORPHINE SULFATE (PF) 4 MG/ML IV SOLN
INTRAVENOUS | Status: AC
  Filled 2017-08-11: qty 1

## 2017-08-11 NOTE — ED Triage Notes (Signed)
Pt arrives via [facility] with c/o abdominal pain for several months. Pt reports that when she urinates, she can feel burning in her stomach and reports when she vomits, she sees "black specs". Pt appears in obvious pain and discomfort at this time.

## 2017-08-12 ENCOUNTER — Emergency Department
Admission: EM | Admit: 2017-08-12 | Discharge: 2017-08-12 | Disposition: A | Discharge status: Home / Self Care | Payer: Self-pay | Attending: Emergency Medicine | Admitting: Emergency Medicine

## 2017-08-12 ENCOUNTER — Emergency Department: Payer: Self-pay

## 2017-08-12 DIAGNOSIS — R1084 Generalized abdominal pain: Secondary | ICD-10-CM

## 2017-08-12 DIAGNOSIS — N390 Urinary tract infection, site not specified: Secondary | ICD-10-CM

## 2017-08-12 DIAGNOSIS — K529 Noninfective gastroenteritis and colitis, unspecified: Secondary | ICD-10-CM

## 2017-08-12 DIAGNOSIS — N83202 Unspecified ovarian cyst, left side: Secondary | ICD-10-CM

## 2017-08-12 LAB — URINALYSIS, COMPLETE (UACMP) WITH MICROSCOPIC
Bilirubin Urine: NEGATIVE
GLUCOSE, UA: NEGATIVE mg/dL
Hgb urine dipstick: NEGATIVE
Ketones, ur: 80 mg/dL — AB
Nitrite: NEGATIVE
PH: 5 (ref 5.0–8.0)
Protein, ur: 100 mg/dL — AB
SPECIFIC GRAVITY, URINE: 1.034 — AB (ref 1.005–1.030)

## 2017-08-12 LAB — POCT PREGNANCY, URINE: Preg Test, Ur: NEGATIVE

## 2017-08-12 MED ORDER — ONDANSETRON 4 MG PO TBDP: 4.0000 mg | ORAL_TABLET | Freq: Three times a day (TID) | ORAL | 0 refills | Status: DC | PRN

## 2017-08-12 MED ORDER — OXYCODONE-ACETAMINOPHEN 5-325 MG PO TABS: 1.0000 | ORAL_TABLET | ORAL | 0 refills | Status: DC | PRN

## 2017-08-12 MED ORDER — SODIUM CHLORIDE 0.9 % IV SOLN
1.0000 g | Freq: Once | INTRAVENOUS | Status: AC
  Administered 2017-08-12: 1 g via INTRAVENOUS
  Filled 2017-08-12: qty 10

## 2017-08-12 MED ORDER — AMOXICILLIN-POT CLAVULANATE 875-125 MG PO TABS: 1.0000 | ORAL_TABLET | Freq: Two times a day (BID) | ORAL | 0 refills | Status: DC

## 2017-08-12 MED ORDER — OXYCODONE-ACETAMINOPHEN 5-325 MG PO TABS
1.0000 | ORAL_TABLET | Freq: Once | ORAL | Status: AC
  Administered 2017-08-12: 1 via ORAL
  Filled 2017-08-12: qty 1

## 2017-08-12 MED ORDER — IOPAMIDOL (ISOVUE-300) INJECTION 61%
30.0000 mL | Freq: Once | INTRAVENOUS | Status: AC
  Administered 2017-08-12: 30 mL via ORAL

## 2017-08-12 MED ORDER — ONDANSETRON 4 MG PO TBDP
4.0000 mg | ORAL_TABLET | Freq: Once | ORAL | Status: AC
  Administered 2017-08-12: 4 mg via ORAL
  Filled 2017-08-12: qty 1

## 2017-08-12 MED ORDER — IOPAMIDOL (ISOVUE-370) INJECTION 76%
100.0000 mL | Freq: Once | INTRAVENOUS | Status: AC | PRN
  Administered 2017-08-12: 100 mL via INTRAVENOUS

## 2017-08-12 NOTE — ED Provider Notes (Signed)
[facility] Emergency Department Provider Note   ____________________________________________   First MD Initiated Contact with Patient 08/12/17 0128     (approximate)  I have reviewed the triage vital signs and the nursing notes.   HISTORY  Chief Complaint Abdominal Pain    HPI Angela Flynn is a 22 y.o. female brought to the ED from home via EMS with a chief complaint of abdominal pain.  Patient reports generalized abdominal pain off and on for the past several months.  States occasionally when she urinates she feels burning in her stomach.  Sometimes when she vomits she sees "black specs".  Pain worse today with nausea and vomiting without blood.  Denies associated fever, chills, chest pain, shortness of breath, diarrhea.  Denies recent travel, trauma or antibiotic use.  Has been eating a lot of tomatoes this week.   Past Medical History:  Diagnosis Date  . Herpes genitalis     Patient Active Problem List   Diagnosis Date Noted  . Gonorrhea 03/29/2015  . Pelvic inflammatory disease 03/28/2015  . SIRS (systemic inflammatory response syndrome) (HCC) 03/28/2015  . Leukocytosis 03/28/2015  . Hypokalemia 03/28/2015    Past Surgical History:  Procedure Laterality Date  . CHOLECYSTECTOMY, LAPAROSCOPIC    . TONSILLECTOMY      Prior to Admission medications   Medication Sig Start Date End Date Taking? Authorizing Provider  acyclovir (ZOVIRAX) 400 MG tablet Take 1,200 mg by mouth daily. 03/15/15   [provider]  cephALEXin (KEFLEX) 500 MG capsule Take 1 capsule (500 mg total) by mouth 4 (four) times daily. 08/14/15   Cuthriell, Delorise Royals, PA-C  cetirizine (ZYRTEC) 10 MG tablet Take 1 tablet (10 mg total) by mouth daily. 08/14/15   Cuthriell, Delorise Royals, PA-C  doxycycline (VIBRA-TABS) 100 MG tablet Take 1 tablet (100 mg total) by mouth every 12 (twelve) hours. 03/31/15   Nadara Mustard, MD  famotidine (PEPCID) 20 MG tablet Take 1 tablet  (20 mg total) by mouth 2 (two) times daily. 08/14/15 08/13/16  Cuthriell, Delorise Royals, PA-C  ibuprofen (ADVIL,MOTRIN) 600 MG tablet Take 1 tablet (600 mg total) by mouth every 4 (four) hours as needed for mild pain or moderate pain. 03/31/15   Nadara Mustard, MD  ondansetron (ZOFRAN) 4 MG tablet Take 1 tablet (4 mg total) by mouth every 6 (six) hours as needed for nausea. 03/31/15   Nadara Mustard, MD  oxyCODONE-acetaminophen (PERCOCET/ROXICET) 5-325 MG tablet Take 2 tablets by mouth every 4 (four) hours as needed for moderate pain or severe pain. 03/31/15   Nadara Mustard, MD  predniSONE (DELTASONE) 10 MG tablet Take 1 tablet (10 mg total) by mouth as directed. 08/14/15   Cuthriell, Delorise Royals, PA-C    Allergies Patient has no known allergies.  No family history on file.  Social History Social History   Tobacco Use  . Smoking status: Former Games developer  . Smokeless tobacco: Never Used  Substance Use Topics  . Alcohol use: No  . Drug use: Yes    Types: Marijuana    Review of Systems  Constitutional: No fever/chills. Eyes: No visual changes. ENT: No sore throat. Cardiovascular: Denies chest pain. Respiratory: Denies shortness of breath. Gastrointestinal: Positive for abdominal pain, nausea and vomiting.  No diarrhea.  No constipation. Genitourinary: Negative for dysuria. Musculoskeletal: Negative for back pain. Skin: Negative for rash. Neurological: Negative for headaches, focal weakness or numbness.   ____________________________________________   PHYSICAL EXAM:  VITAL SIGNS: ED Triage Vitals  Enc Vitals Group     BP 08/11/17 2256 (!) 147/107     Pulse Rate 08/11/17 2256 (!) 118     Resp 08/11/17 2256 18     Temp 08/11/17 2256 98.2 F (36.8 C)     Temp Source 08/11/17 2256 Oral     SpO2 08/11/17 2256 96 %     Weight 08/11/17 2257 250 lb (113.4 kg)     Height 08/11/17 2257  (1.753 m)     Head Circumference --      Peak Flow --      Pain Score 08/11/17 2257 10      Pain Loc --      Pain Edu? --      Excl. in GC? --    Examined after morphine administration: Constitutional: Alert and oriented. Well appearing and in no acute distress. Eyes: Conjunctivae are normal. PERRL. EOMI. Head: Atraumatic. Nose: No congestion/rhinnorhea. Mouth/Throat: Mucous membranes are moist.  Oropharynx non-erythematous. Neck: No stridor.   Cardiovascular: Normal rate, regular rhythm. Grossly normal heart sounds.  Good peripheral circulation. Respiratory: Normal respiratory effort.  No retractions. Lungs CTAB. Gastrointestinal: Soft and nontender to light or deep palpation. No distention. No abdominal bruits. No CVA tenderness. Musculoskeletal: No lower extremity tenderness nor edema.  No joint effusions. Neurologic:  Normal speech and language. No gross focal neurologic deficits are appreciated. No gait instability. Skin:  Skin is warm, dry and intact. No rash noted. Psychiatric: Mood and affect are normal. Speech and behavior are normal.  ____________________________________________   LABS (all labs ordered are listed, but only abnormal results are displayed)  Labs Reviewed  COMPREHENSIVE METABOLIC PANEL - Abnormal; Notable for the following components:      Result Value   Potassium 3.3 (*)    CO2 20 (*)    Glucose, Bld 114 (*)    Total Protein 8.3 (*)    All other components within normal limits  CBC - Abnormal; Notable for the following components:   WBC 15.7 (*)    All other components within normal limits  URINALYSIS, COMPLETE (UACMP) WITH MICROSCOPIC - Abnormal; Notable for the following components:   Color, Urine AMBER (*)    APPearance CLOUDY (*)    Specific Gravity, Urine 1.034 (*)    Ketones, ur 80 (*)    Protein, ur 100 (*)    Leukocytes, UA MODERATE (*)    Bacteria, UA FEW (*)    All other components within normal limits  LIPASE, BLOOD  POC URINE PREG, ED  POCT PREGNANCY, URINE   ____________________________________________  EKG  ED ECG  REPORT I, Taniya Dasher J, the attending physician, personally viewed and interpreted this ECG.   Date: 08/12/2017  EKG Time: 2302  Rate: 93  Rhythm: normal EKG, normal sinus rhythm  Axis: Normal  Intervals:none  ST&T Change: Nonspecific  ____________________________________________  RADIOLOGY  ED MD interpretation: Probable mild colitis  Official radiology report(s): Ct Abdomen W Contrast  Result Date: 08/12/2017 CLINICAL DATA:  22 year old female with nausea and vomiting. EXAM: CT ABDOMEN AND PELVIS WITH CONTRAST TECHNIQUE: Multidetector CT imaging of the abdomen and pelvis was performed using the standard protocol following bolus administration of intravenous contrast. CONTRAST:  ISOVUE-370 IOPAMIDOL (ISOVUE-370) INJECTION 76% COMPARISON:  Abdominal CT dated 03/28/2015 FINDINGS: Lower chest: The visualized lung bases are clear. No intra-abdominal free air or free fluid. Hepatobiliary: Apparent mild fatty infiltration of the liver. There is cholecystectomy. No retained calcified stone identified in the central CBD. Pancreas: Unremarkable. No pancreatic  ductal dilatation or surrounding inflammatory changes. Spleen: Normal in size without focal abnormality. Adrenals/Urinary Tract: Adrenal glands are unremarkable. Kidneys are normal, without renal calculi, focal lesion, or hydronephrosis. Bladder is unremarkable. Stomach/Bowel: There is diffuse thickened appearance of the colon which may be related to underdistention. There is no pericolonic inflammatory changes to suggest acute colitis. However, the possibility of mild colitis is not entirely excluded. Clinical correlation is recommended. There is no bowel obstruction. The appendix is normal. Vascular/Lymphatic: No significant vascular findings are present. No enlarged abdominal or pelvic lymph nodes. Reproductive: The uterus is anteverted and grossly unremarkable. The ovaries appear unremarkable as well. There is a 1.5 cm corpus luteum in the  left ovary. Other: None Musculoskeletal: No acute or significant osseous findings. IMPRESSION: Underdistention of the colon versus mild colitis. Correlation with clinical exam recommended. No bowel obstruction. Normal appendix. Electronically Signed   By: Elgie Collard M.D.   On: 08/12/2017 01:18    ____________________________________________   PROCEDURES  Procedure(s) performed: None  Procedures  Critical Care performed: No  ____________________________________________   INITIAL IMPRESSION / ASSESSMENT AND PLAN / ED COURSE  As part of my medical decision making, I reviewed the following data within the electronic MEDICAL RECORD NUMBER History obtained from family, Nursing notes reviewed and incorporated, Labs reviewed, Old chart reviewed, Radiograph reviewed  and Notes from prior ED visits   22 year old healthy female who presents with abdominal pain, nausea, vomiting and dysuria. Differential diagnosis includes, but is not limited to, ovarian cyst, ovarian torsion, acute appendicitis, diverticulitis, urinary tract infection/pyelonephritis, endometriosis, bowel obstruction, colitis, renal colic, gastroenteritis, hernia, fibroids, endometriosis, pregnancy related pain including ectopic pregnancy, etc.   Patient was medicated in the lobby and CT scan obtained prior to receiving treatment room.  At the time of my examination patient's pain was well controlled.  Updated patient and family members of all test results.  IV Rocephin given for UTI.  Will discharge home with Augmentin to cover UTI as well as mild colitis, Percocet and Zofran as needed for pain and nausea.  Strict return precautions given.  All verbalize understanding and agree with plan of care.      ____________________________________________   FINAL CLINICAL IMPRESSION(S) / ED DIAGNOSES  Final diagnoses:  Generalized abdominal pain  Lower urinary tract infectious disease  Colitis     ED Discharge Orders    None         Note:  This document was prepared using Dragon voice recognition software and may include unintentional dictation errors.    Irean Hong, MD 08/12/17 [id number]

## 2017-08-12 NOTE — Discharge Instructions (Signed)
1.  Take antibiotic as prescribed (Augmentin 875 mg twice daily for 7 days). 2.  You may take medicines as needed for pain and nausea (Percocet/Zofran #20). 3.  Clear liquids for the next 12 hours, then bland diet for the next 3 days, then slowly advance diet as tolerated.  Avoid fatty, greasy, spicy foods and alcohol. 4.  Return to the ER for worsening symptoms, persistent vomiting, difficulty breathing or other concerns.

## 2017-12-22 ENCOUNTER — Emergency Department
Admission: EM | Admit: 2017-12-22 | Discharge: 2017-12-22 | Disposition: A | Discharge status: Home / Self Care | Payer: BLUE CROSS/BLUE SHIELD | Attending: Emergency Medicine | Admitting: Emergency Medicine

## 2017-12-22 ENCOUNTER — Other Ambulatory Visit: Payer: Self-pay

## 2017-12-22 ENCOUNTER — Encounter: Payer: Self-pay | Admitting: Internal Medicine

## 2017-12-22 DIAGNOSIS — L02211 Cutaneous abscess of abdominal wall: Secondary | ICD-10-CM | POA: Insufficient documentation

## 2017-12-22 DIAGNOSIS — Z87891 Personal history of nicotine dependence: Secondary | ICD-10-CM | POA: Insufficient documentation

## 2017-12-22 DIAGNOSIS — F1210 Cannabis abuse, uncomplicated: Secondary | ICD-10-CM | POA: Insufficient documentation

## 2017-12-22 DIAGNOSIS — L02219 Cutaneous abscess of trunk, unspecified: Secondary | ICD-10-CM

## 2017-12-22 DIAGNOSIS — Z79899 Other long term (current) drug therapy: Secondary | ICD-10-CM | POA: Insufficient documentation

## 2017-12-22 MED ORDER — LIDOCAINE-EPINEPHRINE 1 %-1:100000 IJ SOLN
5.0000 mL | Freq: Once | INTRAMUSCULAR | Status: DC
  Filled 2017-12-22: qty 1

## 2017-12-22 MED ORDER — HYDROCODONE-ACETAMINOPHEN 5-325 MG PO TABS: 1.0000 | ORAL_TABLET | Freq: Three times a day (TID) | ORAL | 0 refills | Status: DC | PRN

## 2017-12-22 MED ORDER — SULFAMETHOXAZOLE-TRIMETHOPRIM 800-160 MG PO TABS
1.0000 | ORAL_TABLET | Freq: Once | ORAL | Status: AC
  Administered 2017-12-22: 1 via ORAL
  Filled 2017-12-22: qty 1

## 2017-12-22 MED ORDER — HYDROCODONE-ACETAMINOPHEN 5-325 MG PO TABS
1.0000 | ORAL_TABLET | Freq: Once | ORAL | Status: AC
  Administered 2017-12-22: 1 via ORAL
  Filled 2017-12-22: qty 1

## 2017-12-22 MED ORDER — SULFAMETHOXAZOLE-TRIMETHOPRIM 800-160 MG PO TABS: 1.0000 | ORAL_TABLET | Freq: Two times a day (BID) | ORAL | 0 refills | Status: DC

## 2017-12-22 NOTE — Discharge Instructions (Addendum)
You have been diagnosed with a skin abscess.  We gave you your first dose of antibiotic in the ER.  I have provided you with a prescription for antibiotics twice a day for the next 7 days.  I gave you a dose of hydrocodone-acetaminophen in the ER.  I provided you with a prescription for the same pain medication every 8 hours as needed for severe pain.  Continue warm compresses as needed.  Return if you develop high fever, chills or body aches.

## 2017-12-22 NOTE — ED Provider Notes (Signed)
[facility] Emergency Department Provider Note ____________________________________________  Time seen: 2000  I have reviewed the triage vital signs and the nursing notes.  HISTORY  Chief Complaint  Abscess   HPI Angela Flynn is a 22 y.o. female presents to the ER today with complaint of an abscess of the suprapubic region.  She noticed this about 10 days ago.  The abscess is red, warm and tender to touch.  She has not noticed any drainage from the area.  She denies fever, chills or body aches.  She has tried warm compresses with minimal relief.  Past Medical History:  Diagnosis Date  . Herpes genitalis     Patient Active Problem List   Diagnosis Date Noted  . Gonorrhea 03/29/2015  . Pelvic inflammatory disease 03/28/2015  . SIRS (systemic inflammatory response syndrome) (HCC) 03/28/2015  . Leukocytosis 03/28/2015  . Hypokalemia 03/28/2015    Past Surgical History:  Procedure Laterality Date  . CHOLECYSTECTOMY, LAPAROSCOPIC    . TONSILLECTOMY      Prior to Admission medications   Medication Sig Start Date End Date Taking? Authorizing Provider  acyclovir (ZOVIRAX) 400 MG tablet Take 1,200 mg by mouth daily. 03/15/15   [provider]  amoxicillin-clavulanate (AUGMENTIN) 875-125 MG tablet Take 1 tablet by mouth 2 (two) times daily. 08/12/17   Irean HongSung, Jade J, MD  cephALEXin (KEFLEX) 500 MG capsule Take 1 capsule (500 mg total) by mouth 4 (four) times daily. 08/14/15   Cuthriell, Delorise RoyalsJonathan D, PA-C  cetirizine (ZYRTEC) 10 MG tablet Take 1 tablet (10 mg total) by mouth daily. 08/14/15   Cuthriell, Delorise RoyalsJonathan D, PA-C  doxycycline (VIBRA-TABS) 100 MG tablet Take 1 tablet (100 mg total) by mouth every 12 (twelve) hours. 03/31/15   Nadara MustardHarris, Robert P, MD  famotidine (PEPCID) 20 MG tablet Take 1 tablet (20 mg total) by mouth 2 (two) times daily. 08/14/15 08/13/16  Cuthriell, Delorise RoyalsJonathan D, PA-C  HYDROcodone-acetaminophen (NORCO/VICODIN) 5-325 MG tablet Take 1 tablet  by mouth every 8 (eight) hours as needed for moderate pain. 12/22/17 12/22/18  Lorre MunroeBaity, Regina W, NP  ibuprofen (ADVIL,MOTRIN) 600 MG tablet Take 1 tablet (600 mg total) by mouth every 4 (four) hours as needed for mild pain or moderate pain. 03/31/15   Nadara MustardHarris, Robert P, MD  ondansetron (ZOFRAN ODT) 4 MG disintegrating tablet Take 1 tablet (4 mg total) by mouth every 8 (eight) hours as needed for nausea or vomiting. 08/12/17   Irean HongSung, Jade J, MD  ondansetron (ZOFRAN) 4 MG tablet Take 1 tablet (4 mg total) by mouth every 6 (six) hours as needed for nausea. 03/31/15   Nadara MustardHarris, Robert P, MD  oxyCODONE-acetaminophen (PERCOCET/ROXICET) 5-325 MG tablet Take 1 tablet by mouth every 4 (four) hours as needed for severe pain. 08/12/17   Irean HongSung, Jade J, MD  predniSONE (DELTASONE) 10 MG tablet Take 1 tablet (10 mg total) by mouth as directed. 08/14/15   Cuthriell, Delorise RoyalsJonathan D, PA-C  sulfamethoxazole-trimethoprim (BACTRIM DS,SEPTRA DS) 800-160 MG tablet Take 1 tablet by mouth 2 (two) times daily. 12/22/17   Lorre MunroeBaity, Regina W, NP    Allergies Patient has no known allergies.  History reviewed. No pertinent family history.  Social History Social History   Tobacco Use  . Smoking status: Former Games developermoker  . Smokeless tobacco: Never Used  Substance Use Topics  . Alcohol use: No  . Drug use: Yes    Types: Marijuana    Review of Systems  Constitutional: Negative for fever, chills or body aches. Skin: Positive for  abscess of the suprapubic area.  ____________________________________________  PHYSICAL EXAM:  VITAL SIGNS: ED Triage Vitals  Enc Vitals Group     BP 12/22/17 1944 (!) 149/93     Pulse Rate 12/22/17 1944 87     Resp 12/22/17 1944 16     Temp 12/22/17 1944 98.6 F (37 C)     Temp Source 12/22/17 1944 Oral     SpO2 12/22/17 1944 100 %     Weight 12/22/17 1949 251 lb (113.9 kg)     Height 12/22/17 1949 5\' 9"  (1.753 m)     Head Circumference --      Peak Flow --      Pain Score 12/22/17 1949 8     Pain  Loc --      Pain Edu? --      Excl. in GC? --     Constitutional: Alert and oriented. Crying, tearful. Cardiovascular: Normal rate, regular rhythm.  Respiratory: Hyperventilating but lungs clear. Skin: 0.5 cm nonfluctuant abscess noted of suprapubic area.  No surrounding cellulitis noted. ____________________________________________    INITIAL IMPRESSION / ASSESSMENT AND PLAN / ED COURSE  Suprapubic Abscess:  She adamantly refuses 1&D, almost panicking Hydrocodone-Acetaminophen 5-325 mg 1 tab PO in ER Septra DS 1 tab PO in ER Continue warm compresses eRx for Septra DS 1 tab BID x 7 days eRx for Hydrocodone-Aceteminophen 5-325 mg tab, 1 tab PO Q8H prn, #5, 0 refills Return if you develop fever, chills, body aches     I reviewed the patient's prescription history over the last 12 months in the multi-state controlled substances database(s) that includes [place], [place], [place], [place], [place], [place], [place], [place], [place], [place], [place], [place], [place], and [place].  Results were notable for no controlled substances filled. ____________________________________________  FINAL CLINICAL IMPRESSION(S) / ED DIAGNOSES  Final diagnoses:  Suprapubic abscess       Lorre Munroe, NP 12/22/17 2103    Dionne Bucy, MD 12/23/17 [phone]

## 2017-12-22 NOTE — ED Triage Notes (Signed)
Pt with abscess noted to right upper mons of vagina. Pt states started a week and a half ago. Pt has a wet compress to area. Abscess is approx size of quarter.

## 2017-12-22 NOTE — ED Notes (Signed)
Pt states abscess located in pubic area. Pt instructed to undress from waist down for further assessment by provider.

## 2018-02-19 ENCOUNTER — Emergency Department
Admission: EM | Admit: 2018-02-19 | Discharge: 2018-02-19 | Disposition: A | Discharge status: Home / Self Care | Payer: 59 | Attending: Emergency Medicine | Admitting: Emergency Medicine

## 2018-02-19 ENCOUNTER — Encounter: Payer: Self-pay | Admitting: Emergency Medicine

## 2018-02-19 ENCOUNTER — Other Ambulatory Visit: Payer: Self-pay

## 2018-02-19 DIAGNOSIS — Z5321 Procedure and treatment not carried out due to patient leaving prior to being seen by health care provider: Secondary | ICD-10-CM | POA: Diagnosis not present

## 2018-02-19 DIAGNOSIS — R51 Headache: Secondary | ICD-10-CM | POA: Insufficient documentation

## 2018-02-19 LAB — URINALYSIS, COMPLETE (UACMP) WITH MICROSCOPIC
Bacteria, UA: NONE SEEN
Bilirubin Urine: NEGATIVE
GLUCOSE, UA: NEGATIVE mg/dL
HGB URINE DIPSTICK: NEGATIVE
KETONES UR: NEGATIVE mg/dL
Leukocytes, UA: NEGATIVE
NITRITE: NEGATIVE
PH: 7 (ref 5.0–8.0)
Protein, ur: NEGATIVE mg/dL
Specific Gravity, Urine: 1.021 (ref 1.005–1.030)

## 2018-02-19 LAB — COMPREHENSIVE METABOLIC PANEL
ALK PHOS: 80 U/L (ref 38–126)
ALT: 24 U/L (ref 0–44)
AST: 19 U/L (ref 15–41)
Albumin: 4.1 g/dL (ref 3.5–5.0)
Anion gap: 8 (ref 5–15)
BUN: 12 mg/dL (ref 6–20)
CALCIUM: 8.6 mg/dL — AB (ref 8.9–10.3)
CHLORIDE: 101 mmol/L (ref 98–111)
CO2: 26 mmol/L (ref 22–32)
CREATININE: 0.48 mg/dL (ref 0.44–1.00)
GFR calc Af Amer: >60 mL/min (ref >60)
Glucose, Bld: 98 mg/dL (ref 70–99)
Potassium: 4.2 mmol/L (ref 3.5–5.1)
Sodium: 135 mmol/L (ref 135–145)
TOTAL PROTEIN: 7.6 g/dL (ref 6.5–8.1)
Total Bilirubin: 0.6 mg/dL (ref 0.3–1.2)

## 2018-02-19 LAB — CBC
HCT: 41.6 % (ref 36.0–46.0)
Hemoglobin: 13.6 g/dL (ref 12.0–15.0)
MCH: 30.4 pg (ref 26.0–34.0)
MCHC: 32.7 g/dL (ref 30.0–36.0)
MCV: 92.9 fL (ref 80.0–100.0)
PLATELETS: 335 10*3/uL (ref 150–400)
RBC: 4.48 MIL/uL (ref 3.87–5.11)
RDW: 11.8 % (ref 11.5–15.5)
WBC: 8.5 10*3/uL (ref 4.0–10.5)
nRBC: 0 % (ref 0.0–0.2)

## 2018-02-19 LAB — POCT PREGNANCY, URINE: Preg Test, Ur: NEGATIVE

## 2018-02-19 LAB — TROPONIN I: Troponin I: <0.03 ng/mL (ref <0.03)

## 2018-02-19 NOTE — ED Notes (Addendum)
Patient reports she just wants to leave and does not want to wait for MD to see her but would like a work note.  Will give patient note stating was here today.  Dr Derrill Kay aware.

## 2018-02-19 NOTE — ED Triage Notes (Signed)
States syncopal episode while walking to car this am. Awoke on ground. States chest pain at that time but not now.

## 2018-02-19 NOTE — ED Notes (Signed)
Pt reports having headaches for a couple months. She reports N/V, seeing spots, and getting dizzy with these headaches. She reports today that she passed out with the headache. NAD.

## 2018-02-21 ENCOUNTER — Encounter: Payer: Self-pay | Admitting: Nurse Practitioner

## 2018-02-21 ENCOUNTER — Ambulatory Visit (INDEPENDENT_AMBULATORY_CARE_PROVIDER_SITE_OTHER): Payer: 59 | Admitting: Nurse Practitioner

## 2018-02-21 ENCOUNTER — Other Ambulatory Visit: Payer: Self-pay

## 2018-02-21 VITALS — BP 120/69 | HR 59 | Temp 98.6°F | Ht 69.0 in | Wt 272.2 lb

## 2018-02-21 DIAGNOSIS — Z7689 Persons encountering health services in other specified circumstances: Secondary | ICD-10-CM

## 2018-02-21 DIAGNOSIS — R55 Syncope and collapse: Secondary | ICD-10-CM | POA: Diagnosis not present

## 2018-02-21 DIAGNOSIS — A6000 Herpesviral infection of urogenital system, unspecified: Secondary | ICD-10-CM | POA: Insufficient documentation

## 2018-02-21 DIAGNOSIS — R51 Headache: Secondary | ICD-10-CM | POA: Diagnosis not present

## 2018-02-21 DIAGNOSIS — R519 Headache, unspecified: Secondary | ICD-10-CM

## 2018-02-21 NOTE — Patient Instructions (Addendum)
Angela Flynn,   Thank you for coming in to clinic today.  1. Avoid heavy lifting and straining if you have new dizziness or any sensation of passing.  2. Return to work Monday if dizziness and feelings of passing out have improved.  3. For your headaches, start sumatriptam 50 mg at start of headache.  May take every 2 hours for total of 3 doses.  Please schedule a follow-up appointment with [name], AGNP. Return in about 2 weeks (around 03/07/2018) for leg pain, nausea.  If you have any other questions or concerns, please feel free to call the clinic or send a message through MyChart. You may also schedule an earlier appointment if necessary.  You will receive a survey after today's visit either digitally by e-mail or paper by [employer] mail. Your experiences and feedback matter to us.  Please respond so we know how we are doing as we provide care for you.   Wilhelmina McardleLauren Jalaiyah Throgmorton, DNP, AGNP-BC Adult Gerontology Nurse Practitioner [facility], [facility]

## 2018-02-21 NOTE — Progress Notes (Signed)
Subjective:    Patient ID: Angela Flynn, female    DOB: 1995-05-15, 22 y.o.   MRN: [id number]  Angela RocaSierra Elena Kercher is a 22 y.o. female presenting on 02/21/2018 for Establish Care (sharp Right hip pain that radiates down the leg and foot. Pain worsen with leg elevation. x 1 yr. ); Headache (frequent headaches, throbbing headaches, vision distrubance, and spinning sitiuation. Syncope episode x 2 days ago walking out to her car. ); and GI Problem (upper gastric pain thats worsen with pressure. Hungry sensation, frequent nausea w/ multiple negative pregnancy test )   HPI  Establish Care New Provider Pt last seen by PCP was likely her pediatrician many years ago.    Syncope Has occurred 2 times in last 3 days (Sunday pm and Monday am).  First time was leaving work and second time was going to work.  Is avoiding heavy lifting at work since having had syncopal episodes.  When lifting things she has dizziness regularly. - Doesn't remember events leading up to syncopal event, remembers that "all I see is black"  She does remember some mild nausea, lightheadedness, dots in vision.  Has had these sensations daily.  Nausea has been present x last few months. LMP 02/12/2018 - normal flow for patient x 5 days. - She was having bad headaches on same day she passed out.  Has had migraines in past, but without nausea.  - Passing out is new symptom. - Ibuprofen, peppermint oil, tylenol without relief. - Headache is suddenly starting usually over front of head and behind both ears.    Past Medical History:  Diagnosis Date  . Herpes genitalis    Past Surgical History:  Procedure Laterality Date  . CHOLECYSTECTOMY, LAPAROSCOPIC    . TONSILLECTOMY     Social History   Socioeconomic History  . Marital status: Single    Spouse name: Not on file  . Number of children: Not on file  . Years of education: Not on file  . Highest education level: Not on file  Occupational History  . Not on file    Social Needs  . Financial resource strain: Not on file  . Food insecurity:    Worry: Not on file    Inability: Not on file  . Transportation needs:    Medical: Not on file    Non-medical: Not on file  Tobacco Use  . Smoking status: Current Every Day Smoker    Types: Cigars  . Smokeless tobacco: Never Used  Substance and Sexual Activity  . Alcohol use: No  . Drug use: Yes    Types: Marijuana  . Sexual activity: Not on file  Lifestyle  . Physical activity:    Days per week: Not on file    Minutes per session: Not on file  . Stress: Not on file  Relationships  . Social connections:    Talks on phone: Not on file    Gets together: Not on file    Attends religious service: Not on file    Active member of club or organization: Not on file    Attends meetings of clubs or organizations: Not on file    Relationship status: Not on file  . Intimate partner violence:    Fear of current or ex partner: Not on file    Emotionally abused: Not on file    Physically abused: Not on file    Forced sexual activity: Not on file  Other Topics Concern  . Not on file  Social History Narrative  . Not on file   No family history on file. Current Outpatient Medications on File Prior to Visit  Medication Sig  . acyclovir (ZOVIRAX) 400 MG tablet Take 1,200 mg by mouth as needed.   . dimenhyDRINATE (DRAMAMINE) 50 MG tablet Take 50 mg by mouth every 8 (eight) hours as needed.  Marland Kitchen ibuprofen (ADVIL,MOTRIN) 200 MG tablet Take 200 mg by mouth every 6 (six) hours as needed.  . famotidine (PEPCID) 20 MG tablet Take 1 tablet (20 mg total) by mouth 2 (two) times daily.   No current facility-administered medications on file prior to visit.     Review of Systems  Constitutional: Negative for chills and fever.  HENT: Negative for congestion and sore throat.   Eyes: Negative for pain.  Respiratory: Negative for cough, shortness of breath and wheezing.   Cardiovascular: Negative for chest pain,  palpitations and leg swelling.  Gastrointestinal: Positive for nausea. Negative for abdominal pain, blood in stool, constipation, diarrhea and vomiting.  Endocrine: Negative for polydipsia.  Genitourinary: Negative for dysuria, frequency, hematuria and urgency.  Musculoskeletal: Negative for back pain, myalgias and neck pain.  Skin: Negative.  Negative for rash.  Allergic/Immunologic: Negative for environmental allergies.  Neurological: Positive for dizziness, syncope, light-headedness and headaches. Negative for weakness.  Hematological: Does not bruise/bleed easily.  Psychiatric/Behavioral: Negative for dysphoric mood and suicidal ideas. The patient is nervous/anxious.    Per HPI unless specifically indicated above     Objective:    BP 120/69 (BP Location: Right Arm, Patient Position: Sitting, Cuff Size: Large)   Pulse (!) 59   Temp 98.6 F (37 C) (Oral)   Ht 5\' 9"  (1.753 m)   Wt 272 lb 3.2 oz (123.5 kg)   LMP 02/12/2018   BMI 40.20 kg/m   Wt Readings from Last 3 Encounters:  02/21/18 272 lb 3.2 oz (123.5 kg)  02/19/18 272 lb (123.4 kg)  12/22/17 251 lb (113.9 kg)    Physical Exam  Constitutional: She is oriented to person, place, and time. She appears well-developed and well-nourished. No distress.  HENT:  Head: Normocephalic and atraumatic.  Neck: Normal range of motion. Neck supple.  Cardiovascular: Normal rate, regular rhythm, S1 normal, S2 normal, normal heart sounds and intact distal pulses.  Pulmonary/Chest: Effort normal and breath sounds normal. No respiratory distress.  Neurological: She is alert and oriented to person, place, and time. She has normal strength and normal reflexes. She displays normal reflexes. No cranial nerve deficit or sensory deficit. She displays a negative Romberg sign. Coordination and gait normal. GCS eye subscore is 4. GCS verbal subscore is 5. GCS motor subscore is 6.  Skin: Skin is warm and dry. Capillary refill takes less than 2 seconds.    Psychiatric: She has a normal mood and affect. Her behavior is normal.  Vitals reviewed.   Results for orders placed or performed in visit on 02/21/18  Hemoglobin A1c  Result Value Ref Range   Hgb A1c MFr Bld 5.6 <5.7 % of total Hgb   Mean Plasma Glucose 114 (calc)   eAG (mmol/L) 6.3 (calc)  TSH  Result Value Ref Range   TSH 0.81 mIU/L      Assessment & Plan:   Problem List Items Addressed This Visit    None    Visit Diagnoses    Syncope, unspecified syncope type    -  Primary   Relevant Orders   Hemoglobin A1c (Completed)   TSH (Completed)   Ambulatory referral  to Neurology   Encounter to establish care       Chronic intractable headache, unspecified headache type       Relevant Medications   ibuprofen (ADVIL,MOTRIN) 200 MG tablet   Other Relevant Orders   Ambulatory referral to Neurology   MR Brain Wo Contrast      # Patient with chronic headache and new syncope complaint. Worsens with straining, but is not always associated. No recent or prior neurology workup. Episodes are transient.  Cannot fully exclude association to psychiatric disorder or cardiac cause.   Plan: 1. Referral neurology 2. MRI Brain for evaluation of headache with new syncope. 3. Labs for possible causes not checked in ED. 4. May use ibuprofen for abortive headache therapy.  # Previous PCP was many years ago.  Records will not be requested.  Past medical, family, and surgical history reviewed w/ patient in clinic today.   Follow up plan: Return in about 2 weeks (around 03/07/2018) for leg pain, nausea.  Wilhelmina Mcardle, DNP, AGPCNP-BC Adult Gerontology Primary Care Nurse Practitioner [facility] 02/21/2018, 8:29 AM

## 2018-02-22 LAB — TSH: TSH: 0.81 mIU/L

## 2018-02-22 LAB — HEMOGLOBIN A1C
Hgb A1c MFr Bld: 5.6 % of total Hgb (ref <5.7)
Mean Plasma Glucose: 114 (calc)
eAG (mmol/L): 6.3 (calc)

## 2018-02-28 ENCOUNTER — Telehealth: Payer: Self-pay

## 2018-02-28 DIAGNOSIS — R51 Headache: Principal | ICD-10-CM

## 2018-02-28 DIAGNOSIS — G8929 Other chronic pain: Secondary | ICD-10-CM

## 2018-02-28 DIAGNOSIS — R519 Headache, unspecified: Secondary | ICD-10-CM

## 2018-02-28 MED ORDER — SUMATRIPTAN SUCCINATE 50 MG PO TABS: 50.0000 mg | ORAL_TABLET | ORAL | 1 refills | Status: DC | PRN

## 2018-02-28 NOTE — Telephone Encounter (Signed)
The pt call stating her headache medication was not called in to the pharmacy.

## 2018-02-28 NOTE — Telephone Encounter (Signed)
Medication sent.  Start sumatriptan 50 mg 1 tab every 2 hours up to 3 doses.

## 2018-02-28 NOTE — Telephone Encounter (Signed)
Attempted to contact the pt, no answer. LMOM to return my call.  

## 2018-03-01 ENCOUNTER — Encounter: Payer: Self-pay | Admitting: Nurse Practitioner

## 2018-03-01 NOTE — Telephone Encounter (Signed)
The pt was notified. No questions or concerns. 

## 2018-03-07 ENCOUNTER — Ambulatory Visit: Payer: 59 | Admitting: Nurse Practitioner

## 2018-03-16 ENCOUNTER — Ambulatory Visit: Payer: 59

## 2018-03-21 ENCOUNTER — Ambulatory Visit: Payer: 59

## 2018-03-21 ENCOUNTER — Telehealth: Payer: Self-pay | Admitting: Nurse Practitioner

## 2018-03-21 DIAGNOSIS — R519 Headache, unspecified: Secondary | ICD-10-CM

## 2018-03-21 DIAGNOSIS — R51 Headache: Principal | ICD-10-CM

## 2018-03-21 NOTE — Telephone Encounter (Signed)
Pt was not show for MRI of brain yesterday.

## 2018-03-21 NOTE — Telephone Encounter (Signed)
Incoming

## 2018-03-21 NOTE — Telephone Encounter (Signed)
Angela Flynn from MRI said pt was rescheduled for Dec 26th but they need an order for xray of orbits for clearance for MRI brain.  Her call back number is [phone]

## 2018-04-05 ENCOUNTER — Ambulatory Visit: Admission: RE | Admit: 2018-04-05 | Payer: 59 | Source: Ambulatory Visit

## 2018-04-10 ENCOUNTER — Telehealth: Payer: Self-pay | Admitting: Nurse Practitioner

## 2018-04-10 NOTE — Telephone Encounter (Signed)
Angela BraunKaren with MRI said pt no showed again for MRI of breain.

## 2018-04-10 NOTE — Telephone Encounter (Signed)
Incoming call

## 2018-04-10 NOTE — Telephone Encounter (Signed)
Please do not reschedule until patient can be seen again in clinic.

## 2018-04-17 ENCOUNTER — Ambulatory Visit (INDEPENDENT_AMBULATORY_CARE_PROVIDER_SITE_OTHER): Payer: 59 | Admitting: Nurse Practitioner

## 2018-04-17 ENCOUNTER — Encounter: Payer: Self-pay | Admitting: Nurse Practitioner

## 2018-04-17 VITALS — BP 127/74 | HR 73 | Temp 98.4°F | Resp 17 | Ht 69.0 in | Wt 281.4 lb

## 2018-04-17 DIAGNOSIS — J029 Acute pharyngitis, unspecified: Secondary | ICD-10-CM | POA: Diagnosis not present

## 2018-04-17 DIAGNOSIS — R6889 Other general symptoms and signs: Secondary | ICD-10-CM

## 2018-04-17 DIAGNOSIS — L303 Infective dermatitis: Secondary | ICD-10-CM | POA: Diagnosis not present

## 2018-04-17 LAB — POCT RAPID STREP A (OFFICE): Rapid Strep A Screen: NEGATIVE

## 2018-04-17 LAB — POCT INFLUENZA A/B
Influenza A, POC: NEGATIVE
Influenza B, POC: NEGATIVE

## 2018-04-17 MED ORDER — BALOXAVIR MARBOXIL(80 MG DOSE) 2 X 40 MG PO TBPK: 80.0000 mg | ORAL_TABLET | Freq: Once | ORAL | 0 refills | Status: AC

## 2018-04-17 MED ORDER — IPRATROPIUM BROMIDE 0.06 % NA SOLN: 2.0000 | Freq: Four times a day (QID) | NASAL | 12 refills | Status: DC

## 2018-04-17 MED ORDER — ONDANSETRON 4 MG PO TBDP
4.0000 mg | ORAL_TABLET | Freq: Once | ORAL | Status: AC
  Administered 2018-04-17: 4 mg via ORAL

## 2018-04-17 MED ORDER — TRIAMCINOLONE ACETONIDE 0.025 % EX OINT: 1.0000 "application " | TOPICAL_OINTMENT | Freq: Two times a day (BID) | CUTANEOUS | 1 refills | Status: DC

## 2018-04-17 NOTE — Progress Notes (Signed)
Subjective:    Patient ID: Angela Flynn, female    DOB: 12-26-1995, 23 y.o.   MRN: [id number]  Angela RocaSierra Elena Pratt is a 23 y.o. female presenting on 04/17/2018 for Sore Throat (nausea, vomiting, and severe throat pain. x 2-3 days  Pt states her sister was sick with a sore throat as well over the weelend.) and Rash (rash above the top lip  x 2 mth )   HPI Flu-like symptoms Patient has onset of symptoms for last 2-3 days.  Patient's symptoms include fever, body ache, chills/sweats, rhinorrhea (extreme), cough, nausea, vomiting, sore throat.   - Sick contact with step sister this weekend who was sick with flu. - She is able to keep fluids down most of the time.  Rash Patient notes rash at top lip near nose over the last 2 months.  She states it is painful, itchy, and becomes flaky.  She regularly scrubs it to remove the flakes.  She has not had relief with usual facial moisturizers.  Has never had this type of rash in past.  Social History   Tobacco Use  . Smoking status: Current Every Day Smoker    Types: Cigars  . Smokeless tobacco: Never Used  . Tobacco comment: 2-5 cigars per day on average, which pt uses to smoke marijuana  Substance Use Topics  . Alcohol use: No  . Drug use: Yes    Types: Marijuana    Comment: 2-5 x per day    Review of Systems Per HPI unless specifically indicated above     Objective:    BP 127/74 (BP Location: Right Arm, Patient Position: Sitting, Cuff Size: Large)   Pulse 73   Temp 98.4 F (36.9 C) (Oral)   Resp 17   Ht 5\' 9"  (1.753 m)   Wt 281 lb 6.4 oz (127.6 kg)   BMI 41.56 kg/m   Wt Readings from Last 3 Encounters:  04/17/18 281 lb 6.4 oz (127.6 kg)  02/21/18 272 lb 3.2 oz (123.5 kg)  02/19/18 272 lb (123.4 kg)    Physical Exam Vitals signs reviewed.  Constitutional:      Appearance: She is well-developed.  HENT:     Head: Normocephalic and atraumatic.     Right Ear: Hearing, tympanic membrane, ear canal and external ear  normal.     Left Ear: Hearing, tympanic membrane, ear canal and external ear normal.     Nose: Mucosal edema and rhinorrhea present.     Right Sinus: No maxillary sinus tenderness or frontal sinus tenderness.     Left Sinus: No maxillary sinus tenderness or frontal sinus tenderness.     Mouth/Throat:     Lips: Pink.     Mouth: Mucous membranes are moist.     Pharynx: Uvula midline. Pharyngeal swelling and posterior oropharyngeal erythema present. No oropharyngeal exudate (clear secretions) or uvula swelling.     Tonsils: No tonsillar exudate. Swelling: 1+ on the right. 1+ on the left.  Eyes:     General: Lids are normal.        Right eye: No discharge.        Left eye: No discharge.     Conjunctiva/sclera: Conjunctivae normal.     Pupils: Pupils are equal, round, and reactive to light.  Neck:     Musculoskeletal: Full passive range of motion without pain, normal range of motion and neck supple.  Cardiovascular:     Rate and Rhythm: Normal rate and regular rhythm.  Pulses: Normal pulses.     Heart sounds: Normal heart sounds, S1 normal and S2 normal.  Pulmonary:     Effort: Pulmonary effort is normal. No respiratory distress.     Breath sounds: Normal breath sounds.  Abdominal:     General: Bowel sounds are normal.     Tenderness: There is generalized abdominal tenderness. There is no right CVA tenderness, left CVA tenderness, guarding or rebound. Negative signs include Murphy's sign.  Musculoskeletal:     Right lower leg: No edema.     Left lower leg: No edema.  Lymphadenopathy:     Cervical: Cervical adenopathy present.     Right cervical: Superficial cervical adenopathy present.     Left cervical: Superficial cervical adenopathy present.  Skin:    General: Skin is warm and dry.     Capillary Refill: Capillary refill takes less than 2 seconds.  Neurological:     General: No focal deficit present.     Mental Status: She is alert.     GCS: GCS eye subscore is 4. GCS verbal  subscore is 5. GCS motor subscore is 6.  Psychiatric:        Attention and Perception: Attention normal.        Mood and Affect: Mood normal.        Behavior: Behavior normal. Behavior is cooperative.    Results for orders placed or performed in visit on 02/21/18  Hemoglobin A1c  Result Value Ref Range   Hgb A1c MFr Bld 5.6 <5.7 % of total Hgb   Mean Plasma Glucose 114 (calc)   eAG (mmol/L) 6.3 (calc)  TSH  Result Value Ref Range   TSH 0.81 mIU/L      Assessment & Plan:   Problem List Items Addressed This Visit    None    Visit Diagnoses    Acute sore throat    -  Primary   Relevant Orders   POCT Influenza A/B (Completed)   POCT rapid strep A (Completed)   Flu-like symptoms       Relevant Medications   ondansetron (ZOFRAN-ODT) disintegrating tablet 4 mg (Completed)   ipratropium (ATROVENT) 0.06 % nasal spray   Baloxavir Marboxil,80 MG Dose, (XOFLUZA) 2 x 40 MG TBPK   Eczema, pustular       Relevant Medications   triamcinolone (KENALOG) 0.025 % ointment    # Influenza  Clinically diagnosed influenza despite negative rapid flu test today, concern for flu still due to significant known exposure to positive influenza  - Duration x 2-3 days, without complication. Tolerating PO and well hydrated - No other focal findings of infection today - Did not receive influenza vaccine this season  Plan: 1. Start Xofluza 40 mg x 2 tablets today for one dose.  Encouraged patient to have any sick family members treated early if they start showing signs of illness. 2. Supportive care as advised with NSAID / Tylenol PRN fever/myalgias, improve hydration, may take OTC Cold/Flu meds 3. Zofran ODT PRN nausea/vomiting. Dose administered in clinic today with relief of vomiting prior to leaving. 4. 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  Patient with  increased stress as likely trigger. -START triamcinolone 0.025% ointment bid x 7 days. STOP use for 5 days before resuming if needed. - Follow-up prn.  Consider future dermatology appointment if no improvement.    Meds ordered this encounter  Medications  .  ondansetron (ZOFRAN-ODT) disintegrating tablet 4 mg  . ipratropium (ATROVENT) 0.06 % nasal spray    Sig: Place 2 sprays into both nostrils 4 (four) times daily.    Dispense:  15 mL    Refill:  12    Order Specific Question:   Supervising Provider    Answer:   Smitty Cords [[id number]]  . Baloxavir Marboxil,80 MG Dose, (XOFLUZA) 2 x 40 MG TBPK    Sig: Take 80 mg by mouth once for 1 dose.    Dispense:  2 each    Refill:  0    Order Specific Question:   Supervising Provider    Answer:   Smitty Cords [[id number]]  . triamcinolone (KENALOG) 0.025 % ointment    Sig: Apply 1 application topically 2 (two) times daily. Use for only 7 days in a row.  May repeat after 5 days off.    Dispense:  30 g    Refill:  1    Order Specific Question:   Supervising Provider    Answer:   Smitty Cords [[id number]]    Follow up plan: PRN in 1-2 weeks if worsening symptoms.  Wilhelmina Mcardle, DNP, AGPCNP-BC Adult Gerontology Primary Care Nurse Practitioner [facility] 04/17/2018, 9:32 AM

## 2018-04-17 NOTE — Patient Instructions (Signed)
Angela Flynn,   Thank you for coming in to clinic today.  1. Your flu test was NEGATIVE.  It is possible you can still have the flu with a negative test, otherwise it could be a different virus causing your symptoms.  - Start Xofluza 40 mg capsules.  Take 2 capsules for one dose.   - Wash hands and cover cough very well to avoid spread of infection - For symptom control:      - Take Ibuprofen / Advil 400-600mg  every 6-8 hours as needed for fever / muscle aches.  You may also take Tylenol 500-1000mg  per dose every 6-8 hours or 3 times a day.  You can alternate dosing and take both in the same day.      - Do not take more than 3,000 mg acetaminophen (Tylenol) in a day.      - Start Atrovent nasal spray decongestant 2 sprays in each nostril up to 4 times daily for 7 days.      - Start OTC Mucinex-DM for cough and congestion for up to 7 days. - Improve hydration with plenty of clear fluids.  Drink up to 8 glasses of water / fluids each day.  If significant worsening with poor fluid intake, worsening fever, difficulty breathing due to coughing, worsening body aches, weakness, or other more concerning symptoms difficulty breathing you can seek treatment at Emergency Department. If your flu symptoms have improved and then get worse several days to a week later with concerns for bronchitis, productive cough, fever, and chills we may need to check for possible pneumonia that can occur after the flu.  For others who need care, may go online and schedule an appointment. [employer] (may have moved, so call first) [phone]  Please schedule a follow-up appointment with Wilhelmina Mcardle, AGNP in 1-2 weeks as needed if worsening from Flu / Bronchitis  If you have any other questions or concerns, please feel free to call the clinic or send a message through MyChart. You may also schedule an earlier appointment if necessary.  You will receive a survey after today's  visit either digitally by e-mail or paper by [employer]. Your experiences and feedback matter to [place].  Please respond so we know how we are doing as we provide care for you.   Wilhelmina Mcardle, DNP, AGNP-BC Adult Gerontology Nurse Practitioner [facility], [facility]

## 2018-05-24 DIAGNOSIS — L710 Perioral dermatitis: Secondary | ICD-10-CM | POA: Diagnosis not present

## 2018-05-28 DIAGNOSIS — Z6841 Body Mass Index (BMI) 40.0 and over, adult: Secondary | ICD-10-CM | POA: Diagnosis not present

## 2018-05-28 DIAGNOSIS — N898 Other specified noninflammatory disorders of vagina: Secondary | ICD-10-CM | POA: Diagnosis not present

## 2018-05-28 DIAGNOSIS — E282 Polycystic ovarian syndrome: Secondary | ICD-10-CM | POA: Diagnosis not present

## 2018-05-30 DIAGNOSIS — Z01419 Encounter for gynecological examination (general) (routine) without abnormal findings: Secondary | ICD-10-CM | POA: Diagnosis not present

## 2018-05-30 DIAGNOSIS — Z6841 Body Mass Index (BMI) 40.0 and over, adult: Secondary | ICD-10-CM | POA: Diagnosis not present

## 2018-07-22 ENCOUNTER — Other Ambulatory Visit: Payer: Self-pay

## 2018-07-22 ENCOUNTER — Emergency Department
Admission: EM | Admit: 2018-07-22 | Discharge: 2018-07-22 | Disposition: A | Discharge status: Home / Self Care | Payer: Medicaid Other | Attending: Emergency Medicine | Admitting: Emergency Medicine

## 2018-07-22 ENCOUNTER — Emergency Department: Payer: Medicaid Other

## 2018-07-22 ENCOUNTER — Encounter: Payer: Self-pay | Admitting: Emergency Medicine

## 2018-07-22 DIAGNOSIS — R1030 Lower abdominal pain, unspecified: Secondary | ICD-10-CM | POA: Diagnosis not present

## 2018-07-22 DIAGNOSIS — F17290 Nicotine dependence, other tobacco product, uncomplicated: Secondary | ICD-10-CM | POA: Diagnosis not present

## 2018-07-22 DIAGNOSIS — O9989 Other specified diseases and conditions complicating pregnancy, childbirth and the puerperium: Secondary | ICD-10-CM | POA: Diagnosis not present

## 2018-07-22 DIAGNOSIS — Z79899 Other long term (current) drug therapy: Secondary | ICD-10-CM | POA: Diagnosis not present

## 2018-07-22 DIAGNOSIS — R109 Unspecified abdominal pain: Secondary | ICD-10-CM

## 2018-07-22 DIAGNOSIS — Z3A01 Less than 8 weeks gestation of pregnancy: Secondary | ICD-10-CM | POA: Diagnosis not present

## 2018-07-22 DIAGNOSIS — R102 Pelvic and perineal pain: Secondary | ICD-10-CM | POA: Diagnosis not present

## 2018-07-22 LAB — COMPREHENSIVE METABOLIC PANEL
ALT: 17 U/L (ref 0–44)
AST: 28 U/L (ref 15–41)
Albumin: 4.3 g/dL (ref 3.5–5.0)
Alkaline Phosphatase: 75 U/L (ref 38–126)
Anion gap: 11 (ref 5–15)
BUN: 10 mg/dL (ref 6–20)
CO2: 23 mmol/L (ref 22–32)
Calcium: 9.1 mg/dL (ref 8.9–10.3)
Chloride: 102 mmol/L (ref 98–111)
Creatinine, Ser: 0.41 mg/dL — ABNORMAL LOW (ref 0.44–1.00)
GFR calc Af Amer: >60 mL/min (ref >60)
GFR calc non Af Amer: >60 mL/min (ref >60)
Glucose, Bld: 106 mg/dL — ABNORMAL HIGH (ref 70–99)
Potassium: 4.4 mmol/L (ref 3.5–5.1)
Sodium: 136 mmol/L (ref 135–145)
Total Bilirubin: 1.1 mg/dL (ref 0.3–1.2)
Total Protein: 7.8 g/dL (ref 6.5–8.1)

## 2018-07-22 LAB — URINALYSIS, COMPLETE (UACMP) WITH MICROSCOPIC
Bilirubin Urine: NEGATIVE
Glucose, UA: NEGATIVE mg/dL
Hgb urine dipstick: NEGATIVE
Ketones, ur: NEGATIVE mg/dL
Nitrite: NEGATIVE
Protein, ur: NEGATIVE mg/dL
Specific Gravity, Urine: 1.006 (ref 1.005–1.030)
pH: 8 (ref 5.0–8.0)

## 2018-07-22 LAB — CBC WITH DIFFERENTIAL/PLATELET
Abs Immature Granulocytes: 0.03 10*3/uL (ref 0.00–0.07)
Basophils Absolute: 0 10*3/uL (ref 0.0–0.1)
Basophils Relative: 1 %
Eosinophils Absolute: 0.1 10*3/uL (ref 0.0–0.5)
Eosinophils Relative: 1 %
HCT: 41.7 % (ref 36.0–46.0)
Hemoglobin: 14.1 g/dL (ref 12.0–15.0)
Immature Granulocytes: 0 %
Lymphocytes Relative: 30 %
Lymphs Abs: 2.4 10*3/uL (ref 0.7–4.0)
MCH: 30.3 pg (ref 26.0–34.0)
MCHC: 33.8 g/dL (ref 30.0–36.0)
MCV: 89.5 fL (ref 80.0–100.0)
Monocytes Absolute: 0.4 10*3/uL (ref 0.1–1.0)
Monocytes Relative: 4 %
Neutro Abs: 5.2 10*3/uL (ref 1.7–7.7)
Neutrophils Relative %: 64 %
Platelets: 308 10*3/uL (ref 150–400)
RBC: 4.66 MIL/uL (ref 3.87–5.11)
RDW: 12.6 % (ref 11.5–15.5)
WBC: 8.2 10*3/uL (ref 4.0–10.5)
nRBC: 0 % (ref 0.0–0.2)

## 2018-07-22 LAB — HCG, QUANTITATIVE, PREGNANCY: hCG, Beta Chain, Quant, S: 22399 m[IU]/mL — ABNORMAL HIGH (ref <5)

## 2018-07-22 LAB — LIPASE, BLOOD: Lipase: 23 U/L (ref 11–51)

## 2018-07-22 MED ORDER — ONDANSETRON HCL 4 MG/2ML IJ SOLN
4.0000 mg | Freq: Once | INTRAMUSCULAR | Status: AC
  Administered 2018-07-22: 11:00:00 4 mg via INTRAVENOUS
  Filled 2018-07-22: qty 2

## 2018-07-22 MED ORDER — METOCLOPRAMIDE HCL 5 MG PO TABS: 5.0000 mg | ORAL_TABLET | Freq: Three times a day (TID) | ORAL | 0 refills | Status: DC | PRN

## 2018-07-22 MED ORDER — MORPHINE SULFATE (PF) 4 MG/ML IV SOLN
4.0000 mg | Freq: Once | INTRAVENOUS | Status: AC
  Administered 2018-07-22: 4 mg via INTRAVENOUS
  Filled 2018-07-22: qty 1

## 2018-07-22 NOTE — ED Triage Notes (Signed)
Pt presents to ED via POV with c/o emesis and abdominal pain. Pt c/o upper abdominal pain, states when she attempts to use the bathroom the pain moves to her lower abdomen. Pt presents tearful. Pt states found out last week she is pregnant, unknown how far along. Pt states pain is better with walking. Pt states pain worse with trying to have a bowel movement.

## 2018-07-22 NOTE — Discharge Instructions (Addendum)
Repeat ultrasound is recommended in 2 weeks

## 2018-07-22 NOTE — ED Provider Notes (Signed)
[facility] Emergency Department Provider Note   ____________________________________________    I have reviewed the triage vital signs and the nursing notes.   HISTORY  Chief Complaint Abdominal Pain and Emesis     HPI Angela Flynn is a 23 y.o. female who presents with complaints of lower abdominal pain.  Patient reports 1 week ago she found out that she was pregnant, she estimates she is around [redacted] weeks pregnant.  She has never been pregnant before.  She describes severe cramping lower abdominal pain, this seems to move into her upper abdomen when she tries to have a bowel movement.  She denies dysuria.  No frequency.  No fevers or chills.  Positive nausea, one episode of vomiting.  No new vaginal discharge, no vaginal bleeding.  Past Medical History:  Diagnosis Date  . Allergy    seasonal, dusts  . Herpes genitalis   . MRSA infection    abscesses - history of    Patient Active Problem List   Diagnosis Date Noted  . Herpes genitalis 02/21/2018  . Gonorrhea 03/29/2015  . Pelvic inflammatory disease 03/28/2015  . SIRS (systemic inflammatory response syndrome) (HCC) 03/28/2015  . Leukocytosis 03/28/2015  . Hypokalemia 03/28/2015    Past Surgical History:  Procedure Laterality Date  . CHOLECYSTECTOMY, LAPAROSCOPIC    . TONSILLECTOMY      Prior to Admission medications   Medication Sig Start Date End Date Taking? Authorizing Provider  acyclovir (ZOVIRAX) 400 MG tablet Take 1,200 mg by mouth as needed.  03/15/15   [provider]  dimenhyDRINATE (DRAMAMINE) 50 MG tablet Take 50 mg by mouth every 8 (eight) hours as needed.    [provider]  famotidine (PEPCID) 20 MG tablet Take 1 tablet (20 mg total) by mouth 2 (two) times daily. 08/14/15 08/13/16  Cuthriell, Delorise RoyalsJonathan D, PA-C  ibuprofen (ADVIL,MOTRIN) 200 MG tablet Take 200 mg by mouth every 6 (six) hours as needed.    [provider]  ipratropium (ATROVENT) 0.06  % nasal spray Place 2 sprays into both nostrils 4 (four) times daily. 04/17/18   Galen ManilaKennedy, Lauren Renee, NP  metoCLOPramide (REGLAN) 5 MG tablet Take 1 tablet (5 mg total) by mouth every 8 (eight) hours as needed for nausea. 07/22/18 07/22/19  Jene EveryKinner, Dera Vanaken, MD  SUMAtriptan (IMITREX) 50 MG tablet Take 1 tablet (50 mg total) by mouth every 2 (two) hours as needed for headache. May repeat in 2 hours if headache persists or recurs. Patient not taking: Reported on 04/17/2018 02/28/18   Galen ManilaKennedy, Lauren Renee, NP  triamcinolone (KENALOG) 0.025 % ointment Apply 1 application topically 2 (two) times daily. Use for only 7 days in a row.  May repeat after 5 days off. 04/17/18   Galen ManilaKennedy, Lauren Renee, NP     Allergies Patient has no known allergies.  Family History  Problem Relation Age of Onset  . Bipolar disorder Mother   . Heart disease Sister   . Diabetes Maternal Grandmother     Social History Social History   Tobacco Use  . Smoking status: Current Every Day Smoker    Types: Cigars  . Smokeless tobacco: Never Used  . Tobacco comment: 2-5 cigars per day on average, which pt uses to smoke marijuana  Substance Use Topics  . Alcohol use: No  . Drug use: Yes    Types: Marijuana    Comment: 2-5 x per day    Review of Systems  Constitutional: No fever/chills Eyes: No visual changes.  ENT: No sore throat. Cardiovascular: Denies chest pain. Respiratory: Denies shortness of breath. Gastrointestinal: As above Genitourinary: As above Musculoskeletal: Negative for back pain. Skin: Negative for rash. Neurological: Negative for headaches   ____________________________________________   PHYSICAL EXAM:  VITAL SIGNS: ED Triage Vitals  Enc Vitals Group     BP 07/22/18 1051 106/90     Pulse Rate 07/22/18 1051 85     Resp 07/22/18 1051 20     Temp 07/22/18 1053 98.1 F (36.7 C)     Temp Source 07/22/18 1053 Oral     SpO2 07/22/18 1051 99 %     Weight 07/22/18 1035 121.6 kg (268 lb)      Height 07/22/18 1035 1.753 m ( )     Head Circumference --      Peak Flow --      Pain Score 07/22/18 1035 10     Pain Loc --      Pain Edu? --      Excl. in GC? --     Constitutional: Alert and oriented. Eyes: Conjunctivae are normal.   Nose: No congestion/rhinnorhea. Mouth/Throat: Mucous membranes are moist.    Cardiovascular: Normal rate, regular rhythm. Grossly normal heart sounds.  Good peripheral circulation. Respiratory: Normal respiratory effort.  No retractions. Lungs CTAB. Gastrointestinal: Soft and nontender. No distention.  No CVA tenderness.  Overall reassuring exam  Musculoskeletal:  Warm and well perfused Neurologic:  Normal speech and language. No gross focal neurologic deficits are appreciated.  Skin:  Skin is warm, dry and intact. No rash noted. Psychiatric: Mood and affect are normal. Speech and behavior are normal.  ____________________________________________   LABS (all labs ordered are listed, but only abnormal results are displayed)  Labs Reviewed  HCG, QUANTITATIVE, PREGNANCY - Abnormal; Notable for the following components:      Result Value   hCG, Beta Chain, Quant, S 22,399 (*)    All other components within normal limits  COMPREHENSIVE METABOLIC PANEL - Abnormal; Notable for the following components:   Glucose, Bld 106 (*)    Creatinine, Ser 0.41 (*)    All other components within normal limits  URINALYSIS, COMPLETE (UACMP) WITH MICROSCOPIC - Abnormal; Notable for the following components:   Color, Urine STRAW (*)    APPearance CLEAR (*)    Leukocytes,Ua TRACE (*)    Bacteria, UA RARE (*)    All other components within normal limits  CBC WITH DIFFERENTIAL/PLATELET  LIPASE, BLOOD   ____________________________________________  EKG  None ____________________________________________  RADIOLOGY   ____________________________________________   PROCEDURES  Procedure(s) performed: No  Procedures   Critical Care performed: No  ____________________________________________   INITIAL IMPRESSION / ASSESSMENT AND PLAN / ED COURSE  Pertinent labs & imaging results that were available during my care of the patient were reviewed by me and considered in my medical decision making (see chart for details).  Patient presents with significant cramping in her lower abdomen which is essentially central, does not appear to lateralize at all.  Differential includes early miscarriage, ectopic pregnancy, constipation, colitis.  Will check labs, give IV morphine, IV Zofran as she is quite uncomfortable and obtain ultrasound  Ultrasound demonstrates early gestational sac, no fetal heart rate as yet discussed with patient the need for repeat ultrasound in 14 days  Patient's pain has resolved.  She continues to have some mild nausea.  Will Rx Reglan for nausea, close follow-up with OB recommended.  Return precautions discussed     ____________________________________________   FINAL CLINICAL IMPRESSION(S) /  ED DIAGNOSES  Final diagnoses:  Abdominal pain  Less than [redacted] weeks gestation of pregnancy        Note:  This document was prepared using Dragon voice recognition software and may include unintentional dictation errors.   Jene Every, MD 07/22/18 1446

## 2018-07-23 ENCOUNTER — Encounter: Payer: Self-pay | Admitting: Nurse Practitioner

## 2018-07-23 ENCOUNTER — Ambulatory Visit (INDEPENDENT_AMBULATORY_CARE_PROVIDER_SITE_OTHER): Payer: Self-pay | Admitting: Nurse Practitioner

## 2018-07-23 ENCOUNTER — Encounter: Payer: Self-pay | Admitting: Emergency Medicine

## 2018-07-23 ENCOUNTER — Other Ambulatory Visit: Payer: Self-pay

## 2018-07-23 ENCOUNTER — Emergency Department
Admission: EM | Admit: 2018-07-23 | Discharge: 2018-07-23 | Disposition: A | Discharge status: Home / Self Care | Payer: Medicaid Other | Attending: Emergency Medicine | Admitting: Emergency Medicine

## 2018-07-23 ENCOUNTER — Emergency Department: Payer: Medicaid Other

## 2018-07-23 DIAGNOSIS — O99331 Smoking (tobacco) complicating pregnancy, first trimester: Secondary | ICD-10-CM | POA: Insufficient documentation

## 2018-07-23 DIAGNOSIS — O26891 Other specified pregnancy related conditions, first trimester: Secondary | ICD-10-CM

## 2018-07-23 DIAGNOSIS — R112 Nausea with vomiting, unspecified: Secondary | ICD-10-CM

## 2018-07-23 DIAGNOSIS — Z3A00 Weeks of gestation of pregnancy not specified: Secondary | ICD-10-CM | POA: Insufficient documentation

## 2018-07-23 DIAGNOSIS — R197 Diarrhea, unspecified: Secondary | ICD-10-CM

## 2018-07-23 DIAGNOSIS — R109 Unspecified abdominal pain: Secondary | ICD-10-CM | POA: Diagnosis not present

## 2018-07-23 DIAGNOSIS — F17210 Nicotine dependence, cigarettes, uncomplicated: Secondary | ICD-10-CM | POA: Diagnosis not present

## 2018-07-23 DIAGNOSIS — R1084 Generalized abdominal pain: Secondary | ICD-10-CM

## 2018-07-23 LAB — URINALYSIS, COMPLETE (UACMP) WITH MICROSCOPIC
Bacteria, UA: NONE SEEN
Bilirubin Urine: NEGATIVE
Glucose, UA: NEGATIVE mg/dL
Hgb urine dipstick: NEGATIVE
Ketones, ur: 80 mg/dL — AB
Leukocytes,Ua: NEGATIVE
Nitrite: NEGATIVE
Protein, ur: NEGATIVE mg/dL
Specific Gravity, Urine: 1.024 (ref 1.005–1.030)
pH: 5 (ref 5.0–8.0)

## 2018-07-23 LAB — COMPREHENSIVE METABOLIC PANEL
ALT: 19 U/L (ref 0–44)
AST: 22 U/L (ref 15–41)
Albumin: 4.2 g/dL (ref 3.5–5.0)
Alkaline Phosphatase: 74 U/L (ref 38–126)
Anion gap: 9 (ref 5–15)
BUN: 10 mg/dL (ref 6–20)
CO2: 19 mmol/L — ABNORMAL LOW (ref 22–32)
Calcium: 8.9 mg/dL (ref 8.9–10.3)
Chloride: 106 mmol/L (ref 98–111)
Creatinine, Ser: 0.47 mg/dL (ref 0.44–1.00)
GFR calc Af Amer: >60 mL/min (ref >60)
GFR calc non Af Amer: >60 mL/min (ref >60)
Glucose, Bld: 112 mg/dL — ABNORMAL HIGH (ref 70–99)
Potassium: 3.5 mmol/L (ref 3.5–5.1)
Sodium: 134 mmol/L — ABNORMAL LOW (ref 135–145)
Total Bilirubin: 1.1 mg/dL (ref 0.3–1.2)
Total Protein: 7.9 g/dL (ref 6.5–8.1)

## 2018-07-23 LAB — CBC WITH DIFFERENTIAL/PLATELET
Abs Immature Granulocytes: 0.04 10*3/uL (ref 0.00–0.07)
Basophils Absolute: 0 10*3/uL (ref 0.0–0.1)
Basophils Relative: 0 %
Eosinophils Absolute: 0 10*3/uL (ref 0.0–0.5)
Eosinophils Relative: 0 %
HCT: 42.8 % (ref 36.0–46.0)
Hemoglobin: 14.3 g/dL (ref 12.0–15.0)
Immature Granulocytes: 0 %
Lymphocytes Relative: 20 %
Lymphs Abs: 2.3 10*3/uL (ref 0.7–4.0)
MCH: 29.7 pg (ref 26.0–34.0)
MCHC: 33.4 g/dL (ref 30.0–36.0)
MCV: 88.8 fL (ref 80.0–100.0)
Monocytes Absolute: 0.4 10*3/uL (ref 0.1–1.0)
Monocytes Relative: 4 %
Neutro Abs: 8.6 10*3/uL — ABNORMAL HIGH (ref 1.7–7.7)
Neutrophils Relative %: 76 %
Platelets: 358 10*3/uL (ref 150–400)
RBC: 4.82 MIL/uL (ref 3.87–5.11)
RDW: 12.4 % (ref 11.5–15.5)
WBC: 11.5 10*3/uL — ABNORMAL HIGH (ref 4.0–10.5)
nRBC: 0 % (ref 0.0–0.2)

## 2018-07-23 LAB — HCG, QUANTITATIVE, PREGNANCY: hCG, Beta Chain, Quant, S: 25906 m[IU]/mL — ABNORMAL HIGH (ref <5)

## 2018-07-23 LAB — LIPASE, BLOOD: Lipase: 19 U/L (ref 11–51)

## 2018-07-23 MED ORDER — ONDANSETRON HCL 4 MG/2ML IJ SOLN
4.0000 mg | Freq: Once | INTRAMUSCULAR | Status: AC
  Administered 2018-07-23: 12:00:00 4 mg via INTRAVENOUS
  Filled 2018-07-23: qty 2

## 2018-07-23 MED ORDER — MORPHINE SULFATE (PF) 4 MG/ML IV SOLN
4.0000 mg | Freq: Once | INTRAVENOUS | Status: AC
  Administered 2018-07-23: 4 mg via INTRAVENOUS
  Filled 2018-07-23: qty 1

## 2018-07-23 MED ORDER — SODIUM CHLORIDE 0.9 % IV SOLN
Freq: Once | INTRAVENOUS | Status: AC
  Administered 2018-07-23: 10:00:00 via INTRAVENOUS

## 2018-07-23 MED ORDER — POLYETHYLENE GLYCOL 3350 17 G PO PACK: 17.0000 g | PACK | Freq: Every day | ORAL | 0 refills | Status: DC

## 2018-07-23 MED ORDER — ONDANSETRON 4 MG PO TBDP: 4.0000 mg | ORAL_TABLET | Freq: Three times a day (TID) | ORAL | 1 refills | Status: DC | PRN

## 2018-07-23 MED ORDER — ONDANSETRON HCL 4 MG/2ML IJ SOLN
4.0000 mg | Freq: Once | INTRAMUSCULAR | Status: AC
  Administered 2018-07-23: 11:00:00 4 mg via INTRAVENOUS
  Filled 2018-07-23: qty 2

## 2018-07-23 NOTE — ED Triage Notes (Signed)
Pt here with c/o pain just above her belly button, was seen here yesterday for the same, states vomiting started this am. States this is her first pregnancy. Is tearful and moaning in triage.

## 2018-07-23 NOTE — ED Notes (Signed)
Patient gave verbal permission to this RN for medical information and updates to be given to aunt, Loyal Jacobson.

## 2018-07-23 NOTE — ED Notes (Signed)
Pt verbalizes d/c udnerstanding, follow up and RX given. PT in NAD, ambulatory,. Pt called mother for transport

## 2018-07-23 NOTE — ED Notes (Signed)
MD Mayford Knife aware of BP

## 2018-07-23 NOTE — ED Notes (Signed)
Pt family called to get update, given update to [employer] pt pt consent.

## 2018-07-23 NOTE — ED Provider Notes (Signed)
[facility] Emergency Department Provider Note       Time seen: ----------------------------------------- 10:21 AM on 07/23/2018 -----------------------------------------   I have reviewed the triage vital signs and the nursing notes.  HISTORY   Chief Complaint Abdominal Pain and Emesis    HPI Angela Flynn is a 23 y.o. female with a history of PID, Sirs who is currently in the first trimester pregnancy who presents to the ED for abdominal pain just above her umbilicus.  Patient states she was seen yesterday for similar.  She has started vomiting this morning and cannot keep anything down.  States this is her first pregnancy.  She is in severe pain at this time.  Past Medical History:  Diagnosis Date  . Allergy    seasonal, dusts  . Herpes genitalis   . MRSA infection    abscesses - history of    Patient Active Problem List   Diagnosis Date Noted  . Herpes genitalis 02/21/2018  . Gonorrhea 03/29/2015  . Pelvic inflammatory disease 03/28/2015  . SIRS (systemic inflammatory response syndrome) (HCC) 03/28/2015  . Leukocytosis 03/28/2015  . Hypokalemia 03/28/2015    Past Surgical History:  Procedure Laterality Date  . CHOLECYSTECTOMY, LAPAROSCOPIC    . TONSILLECTOMY      Allergies Patient has no known allergies.  Social History Social History   Tobacco Use  . Smoking status: Current Every Day Smoker    Types: Cigars  . Smokeless tobacco: Never Used  . Tobacco comment: 2-5 cigars per day on average, which pt uses to smoke marijuana  Substance Use Topics  . Alcohol use: No  . Drug use: Yes    Types: Marijuana    Comment: 2-5 x per day   Review of Systems Constitutional: Negative for fever. Cardiovascular: Negative for chest pain. Respiratory: Negative for shortness of breath. Gastrointestinal: Positive for abdominal pain, vomiting Genitourinary: Negative for dysuria. Musculoskeletal: Negative for back pain. Skin: Negative  for rash. Neurological: Negative for headaches, focal weakness or numbness.  All systems negative/normal/unremarkable except as stated in the HPI  ____________________________________________   PHYSICAL EXAM:  VITAL SIGNS: ED Triage Vitals  Enc Vitals Group     BP 07/23/18 1000 (!) 130/103     Pulse Rate 07/23/18 1000 99     Resp 07/23/18 1000 (!) 26     Temp 07/23/18 1000 97.9 F (36.6 C)     Temp Source 07/23/18 1000 Oral     SpO2 07/23/18 1000 100 %     Weight 07/23/18 1000 268 lb (121.6 kg)     Height 07/23/18 1000 5\' 9"  (1.753 m)     Head Circumference --      Peak Flow --      Pain Score 07/23/18 1004 10     Pain Loc --      Pain Edu? --      Excl. in GC? --    Constitutional: Alert and oriented.  Mild to moderate distress Eyes: Conjunctivae are normal. Normal extraocular movements. ENT      Head: Normocephalic and atraumatic.      Nose: No congestion/rhinnorhea.      Mouth/Throat: Mucous membranes are moist.      Neck: No stridor. Cardiovascular: Normal rate, regular rhythm. No murmurs, rubs, or gallops. Respiratory: Normal respiratory effort without tachypnea nor retractions. Breath sounds are clear and equal bilaterally. No wheezes/rales/rhonchi. Gastrointestinal: Severe upper abdominal tenderness, no rebound or guarding.  Normal bowel sounds. Musculoskeletal: Nontender with normal range of motion in  extremities. No lower extremity tenderness nor edema. Neurologic:  Normal speech and language. No gross focal neurologic deficits are appreciated.  Skin:  Skin is warm, dry and intact. No rash noted. Psychiatric: Mood and affect are normal. Speech and behavior are normal.  ____________________________________________  ED COURSE:  As part of my medical decision making, I reviewed the following data within the electronic MEDICAL RECORD NUMBER History obtained from family if available, nursing notes, old chart and ekg, as well as notes from prior ED visits. Patient  presented for abdominal pain and vomiting, we will assess with labs and imaging as indicated at this time.   Procedures  Angela Flynn was evaluated in Emergency Department on 07/23/2018 for the symptoms described in the history of present illness. She was evaluated in the context of the global COVID-19 pandemic, which necessitated consideration that the patient might be at risk for infection with the SARS-CoV-2 virus that causes COVID-19. Institutional protocols and algorithms that pertain to the evaluation of patients at risk for COVID-19 are in a state of rapid change based on information released by regulatory bodies including the CDC and federal and state organizations. These policies and algorithms were followed during the patient's care in the ED.  ____________________________________________   LABS (pertinent positives/negatives)  Labs Reviewed  CBC WITH DIFFERENTIAL/PLATELET - Abnormal; Notable for the following components:      Result Value   WBC 11.5 (*)    Neutro Abs 8.6 (*)    All other components within normal limits  COMPREHENSIVE METABOLIC PANEL - Abnormal; Notable for the following components:   Sodium 134 (*)    CO2 19 (*)    Glucose, Bld 112 (*)    All other components within normal limits  URINALYSIS, COMPLETE (UACMP) WITH MICROSCOPIC - Abnormal; Notable for the following components:   Color, Urine YELLOW (*)    APPearance HAZY (*)    Ketones, ur 80 (*)    All other components within normal limits  HCG, QUANTITATIVE, PREGNANCY - Abnormal; Notable for the following components:   hCG, Beta Chain, Quant, S 25,906 (*)    All other components within normal limits  LIPASE, BLOOD   ____________________________________________   DIFFERENTIAL DIAGNOSIS   Gastritis, gastroenteritis, hyperemesis, dehydration, cholelithiasis, GERD, peptic ulcer disease, PID, UTI  FINAL ASSESSMENT AND PLAN  Abdominal pain in early pregnancy, vomiting   Plan: The patient had  presented for abdominal pain and vomiting. Patient's labs mild leukocytosis but no other acute process.  Patient's imaging yesterday was reassuring.  I have advised that she must get an MRI if her pain persist.  At this point she has declined.  She will be discharged with antiemetics.   Ulice Dash, MD    Note: This note was generated in part or whole with voice recognition software. Voice recognition is usually quite accurate but there are transcription errors that can and very often do occur. I apologize for any typographical errors that were not detected and corrected.     Emily Filbert, MD 07/23/18 1400

## 2018-07-23 NOTE — Progress Notes (Signed)
Telemedicine Encounter: Disclosed to patient at start of encounter that we will provide appropriate telemedicine services.  Patient consents to be treated via phone prior to discussion. - Patient is at her home and is accessed via telephone. - Services are provided by Wilhelmina Mcardle from [facility]  Subjective:    Patient ID: Angela Flynn, female    DOB: 06-02-1995, 23 y.o.   MRN: [id number]  Angela Flynn is a 23 y.o. female presenting on 07/23/2018 for Abdominal Pain (severe cramping abdominal pain w/ sweats. Pt was seen at the hospital x 1 day ago and was told she was pregnant. LMS 06/14/18, ultrasound done at hospital)  History is also provided partially by patient's mother [name] who was not allowed to enter ER with patient yesterday.  Pt is in so much pain conversation is limited.  HPI Abdominal Pain Patient notes severe pain that hurts worse with talking and moving. Prior to the last 12 hours, abdominal cramping comes and goes and lasts for period of 1-2 hours before subsiding.  These are not consistent with uterine cramps/labor pains.  Patient notes abdominal pain has become constant for last 12 hours and is severe causing sweats due to pain.  Active Pregnancy < [redacted] weeks GA - Patient is pregnant with expected GA of [redacted] weeks approx.  LMP 06/14/2018 and positive bHCG and [place] has yolk sack without fetal pole (07/22/2018)  Constipation Patient states she has sensation that she needs to have a bowel movement, but cannot defecate.  She states that she only releases very watery stool.  Previously was having very hard BM.  Patient has not taken anything for constipation OTC.   Patient has had contact with BF's mother who has had C.diff.  Patient's mother asks if this could be cause of problems. - Patient has also had very poor appetite over last 3 days with very little oral intake.  She is still drinking some fluids. - Patient reports no n/v actively.  Social  History   Tobacco Use  . Smoking status: Current Every Day Smoker    Types: Cigars  . Smokeless tobacco: Never Used  . Tobacco comment: 2-5 cigars per day on average, which pt uses to smoke marijuana  Substance Use Topics  . Alcohol use: No  . Drug use: Yes    Types: Marijuana    Comment: 2-5 x per day    Review of Systems Per HPI unless specifically indicated above     Objective:    LMP 06/14/2018   Wt Readings from Last 3 Encounters:  07/22/18 268 lb (121.6 kg)  04/17/18 281 lb 6.4 oz (127.6 kg)  02/21/18 272 lb 3.2 oz (123.5 kg)    Physical Exam Patient remotely monitored.  Verbal communication appropriate.  Cognition normal. Patient is only able to speak 2-3 words consecutively.  Moans/groans frequently due to pain.  Results for orders placed or performed during the hospital encounter of 07/22/18  hCG, quantitative, pregnancy  Result Value Ref Range   hCG, Beta Chain, Quant, S 22,399 (H) <5 mIU/mL  CBC with Differential  Result Value Ref Range   WBC 8.2 4.0 - 10.5 K/uL   RBC 4.66 3.87 - 5.11 MIL/uL   Hemoglobin 14.1 12.0 - 15.0 g/dL   HCT 52.8 41.3 - 24.4 %   MCV 89.5 80.0 - 100.0 fL   MCH 30.3 26.0 - 34.0 pg   MCHC 33.8 30.0 - 36.0 g/dL   RDW 01.0 27.2 - 53.6 %  Platelets 308 150 - 400 K/uL   nRBC 0.0 0.0 - 0.2 %   Neutrophils Relative % 64 %   Neutro Abs 5.2 1.7 - 7.7 K/uL   Lymphocytes Relative 30 %   Lymphs Abs 2.4 0.7 - 4.0 K/uL   Monocytes Relative 4 %   Monocytes Absolute 0.4 0.1 - 1.0 K/uL   Eosinophils Relative 1 %   Eosinophils Absolute 0.1 0.0 - 0.5 K/uL   Basophils Relative 1 %   Basophils Absolute 0.0 0.0 - 0.1 K/uL   Immature Granulocytes 0 %   Abs Immature Granulocytes 0.03 0.00 - 0.07 K/uL  Comprehensive metabolic panel  Result Value Ref Range   Sodium 136 135 - 145 mmol/L   Potassium 4.4 3.5 - 5.1 mmol/L   Chloride 102 98 - 111 mmol/L   CO2 23 22 - 32 mmol/L   Glucose, Bld 106 (H) 70 - 99 mg/dL   BUN 10 6 - 20 mg/dL   Creatinine,  Ser 1.610.41 (L) 0.44 - 1.00 mg/dL   Calcium 9.1 8.9 - 09.610.3 mg/dL   Total Protein 7.8 6.5 - 8.1 g/dL   Albumin 4.3 3.5 - 5.0 g/dL   AST 28 15 - 41 U/L   ALT 17 0 - 44 U/L   Alkaline Phosphatase 75 38 - 126 U/L   Total Bilirubin 1.1 0.3 - 1.2 mg/dL   GFR calc non Af Amer >60 >60 mL/min   GFR calc Af Amer >60 >60 mL/min   Anion gap 11 5 - 15  Lipase, blood  Result Value Ref Range   Lipase 23 11 - 51 U/L  Urinalysis, Complete w Microscopic  Result Value Ref Range   Color, Urine STRAW (A) YELLOW   APPearance CLEAR (A) CLEAR   Specific Gravity, Urine 1.006 1.005 - 1.030   pH 8.0 5.0 - 8.0   Glucose, UA NEGATIVE NEGATIVE mg/dL   Hgb urine dipstick NEGATIVE NEGATIVE   Bilirubin Urine NEGATIVE NEGATIVE   Ketones, ur NEGATIVE NEGATIVE mg/dL   Protein, ur NEGATIVE NEGATIVE mg/dL   Nitrite NEGATIVE NEGATIVE   Leukocytes,Ua TRACE (A) NEGATIVE   WBC, UA 0-5 0 - 5 WBC/hpf   Bacteria, UA RARE (A) NONE SEEN   Squamous Epithelial / LPF 0-5 0 - 5      Assessment & Plan:   Problem List Items Addressed This Visit    None    Visit Diagnoses    Generalized abdominal pain    -  Primary   Diarrhea, unspecified type         Acute abdominal pain with rectal tenesmus.  DDx: ileus vs Cdiff colitis.  Patient has had positive contact with person infected with Cdiff.  Patient with new pregnancy and constipation, higher than average risk for ileus.  No established ObGYN to date.  Plan: 1. Encourage patient to establish with ObGYN ASAP for close monitoring. Provided local ObGYN offices information. 2. For constipation and possible ileus: - Start 2 colace, 2 dulcolax, and 1 full dose miralax today.  Instructed patient and mother this may temporarily increase abdominal cramping, but will help pass any ileus.  Also noted that patient may have Cdiff and these medicines could worsen symptoms. 3. If watery diarrhea continues - fastest place for Cdiff testing will be ER.  Can also arrange stat testing via [facility]  outpatient with stool sample. 4. Follow-up in ER if symptoms worsen, pain suddenly resolves in next 12-24 hours.  - Time spent in direct consultation with patient via telemedicine  about above concerns: 7 minutes  Follow up plan: Return to ER in next 12-24 hours if symptoms worsen or fail to improve.  Wilhelmina Mcardle, DNP, AGPCNP-BC Adult Gerontology Primary Care Nurse Practitioner [facility] 07/23/2018, 8:53 AM

## 2018-07-23 NOTE — ED Notes (Signed)
Pt sobbing/hollering  while trying to assess.

## 2018-07-24 ENCOUNTER — Inpatient Hospital Stay (HOSPITAL_COMMUNITY)
Admission: EM | Admit: 2018-07-24 | Discharge: 2018-07-28 | DRG: 833 | Disposition: A | Discharge status: Home / Self Care | Payer: Medicaid Other | Attending: Internal Medicine | Admitting: Internal Medicine

## 2018-07-24 ENCOUNTER — Other Ambulatory Visit: Payer: Self-pay

## 2018-07-24 ENCOUNTER — Emergency Department (HOSPITAL_COMMUNITY): Payer: Medicaid Other

## 2018-07-24 ENCOUNTER — Encounter (HOSPITAL_COMMUNITY): Payer: Self-pay | Admitting: Emergency Medicine

## 2018-07-24 DIAGNOSIS — R1084 Generalized abdominal pain: Secondary | ICD-10-CM

## 2018-07-24 DIAGNOSIS — R109 Unspecified abdominal pain: Secondary | ICD-10-CM

## 2018-07-24 DIAGNOSIS — R112 Nausea with vomiting, unspecified: Secondary | ICD-10-CM | POA: Diagnosis present

## 2018-07-24 DIAGNOSIS — Z3A01 Less than 8 weeks gestation of pregnancy: Secondary | ICD-10-CM

## 2018-07-24 DIAGNOSIS — Z79899 Other long term (current) drug therapy: Secondary | ICD-10-CM

## 2018-07-24 DIAGNOSIS — O99211 Obesity complicating pregnancy, first trimester: Secondary | ICD-10-CM | POA: Diagnosis present

## 2018-07-24 DIAGNOSIS — D649 Anemia, unspecified: Secondary | ICD-10-CM | POA: Diagnosis present

## 2018-07-24 DIAGNOSIS — R1013 Epigastric pain: Secondary | ICD-10-CM | POA: Diagnosis present

## 2018-07-24 DIAGNOSIS — Z9049 Acquired absence of other specified parts of digestive tract: Secondary | ICD-10-CM

## 2018-07-24 DIAGNOSIS — O418X10 Other specified disorders of amniotic fluid and membranes, first trimester, not applicable or unspecified: Secondary | ICD-10-CM | POA: Diagnosis present

## 2018-07-24 DIAGNOSIS — O99331 Smoking (tobacco) complicating pregnancy, first trimester: Secondary | ICD-10-CM | POA: Diagnosis present

## 2018-07-24 DIAGNOSIS — O468X1 Other antepartum hemorrhage, first trimester: Secondary | ICD-10-CM | POA: Diagnosis present

## 2018-07-24 DIAGNOSIS — O208 Other hemorrhage in early pregnancy: Secondary | ICD-10-CM | POA: Diagnosis present

## 2018-07-24 DIAGNOSIS — O99011 Anemia complicating pregnancy, first trimester: Secondary | ICD-10-CM | POA: Diagnosis present

## 2018-07-24 DIAGNOSIS — O26891 Other specified pregnancy related conditions, first trimester: Secondary | ICD-10-CM | POA: Diagnosis present

## 2018-07-24 DIAGNOSIS — O3680X0 Pregnancy with inconclusive fetal viability, not applicable or unspecified: Secondary | ICD-10-CM | POA: Diagnosis present

## 2018-07-24 DIAGNOSIS — O99281 Endocrine, nutritional and metabolic diseases complicating pregnancy, first trimester: Secondary | ICD-10-CM | POA: Diagnosis present

## 2018-07-24 DIAGNOSIS — E876 Hypokalemia: Secondary | ICD-10-CM | POA: Diagnosis present

## 2018-07-24 DIAGNOSIS — F17290 Nicotine dependence, other tobacco product, uncomplicated: Secondary | ICD-10-CM | POA: Diagnosis present

## 2018-07-24 LAB — CBC WITH DIFFERENTIAL/PLATELET
Abs Immature Granulocytes: 0.04 10*3/uL (ref 0.00–0.07)
Basophils Absolute: 0.1 10*3/uL (ref 0.0–0.1)
Basophils Relative: 0 %
Eosinophils Absolute: 0 10*3/uL (ref 0.0–0.5)
Eosinophils Relative: 0 %
HCT: 43.6 % (ref 36.0–46.0)
Hemoglobin: 14.1 g/dL (ref 12.0–15.0)
Immature Granulocytes: 0 %
Lymphocytes Relative: 21 %
Lymphs Abs: 2.5 10*3/uL (ref 0.7–4.0)
MCH: 29.3 pg (ref 26.0–34.0)
MCHC: 32.3 g/dL (ref 30.0–36.0)
MCV: 90.6 fL (ref 80.0–100.0)
Monocytes Absolute: 0.6 10*3/uL (ref 0.1–1.0)
Monocytes Relative: 5 %
Neutro Abs: 9.2 10*3/uL — ABNORMAL HIGH (ref 1.7–7.7)
Neutrophils Relative %: 74 %
Platelets: 348 10*3/uL (ref 150–400)
RBC: 4.81 MIL/uL (ref 3.87–5.11)
RDW: 12.3 % (ref 11.5–15.5)
WBC: 12.4 10*3/uL — ABNORMAL HIGH (ref 4.0–10.5)
nRBC: 0 % (ref 0.0–0.2)

## 2018-07-24 LAB — COMPREHENSIVE METABOLIC PANEL
ALT: 17 U/L (ref 0–44)
AST: 23 U/L (ref 15–41)
Albumin: 4.1 g/dL (ref 3.5–5.0)
Alkaline Phosphatase: 78 U/L (ref 38–126)
Anion gap: 10 (ref 5–15)
BUN: 7 mg/dL (ref 6–20)
CO2: 19 mmol/L — ABNORMAL LOW (ref 22–32)
Calcium: 8.9 mg/dL (ref 8.9–10.3)
Chloride: 106 mmol/L (ref 98–111)
Creatinine, Ser: 0.56 mg/dL (ref 0.44–1.00)
GFR calc Af Amer: >60 mL/min (ref >60)
GFR calc non Af Amer: >60 mL/min (ref >60)
Glucose, Bld: 92 mg/dL (ref 70–99)
Potassium: 3.9 mmol/L (ref 3.5–5.1)
Sodium: 135 mmol/L (ref 135–145)
Total Bilirubin: 1 mg/dL (ref 0.3–1.2)
Total Protein: 7.3 g/dL (ref 6.5–8.1)

## 2018-07-24 LAB — WET PREP, GENITAL
Clue Cells Wet Prep HPF POC: NONE SEEN
Sperm: NONE SEEN
Trich, Wet Prep: NONE SEEN
Yeast Wet Prep HPF POC: NONE SEEN

## 2018-07-24 LAB — URINALYSIS, ROUTINE W REFLEX MICROSCOPIC
Bilirubin Urine: NEGATIVE
Glucose, UA: NEGATIVE mg/dL
Hgb urine dipstick: NEGATIVE
Ketones, ur: 80 mg/dL — AB
Leukocytes,Ua: NEGATIVE
Nitrite: NEGATIVE
Protein, ur: NEGATIVE mg/dL
Specific Gravity, Urine: 1.024 (ref 1.005–1.030)
pH: 5 (ref 5.0–8.0)

## 2018-07-24 LAB — LACTIC ACID, PLASMA
Lactic Acid, Venous: 1.5 mmol/L (ref 0.5–1.9)
Lactic Acid, Venous: 1.4 mmol/L (ref 0.5–1.9)

## 2018-07-24 LAB — HCG, QUANTITATIVE, PREGNANCY: hCG, Beta Chain, Quant, S: 29525 m[IU]/mL — ABNORMAL HIGH (ref <5)

## 2018-07-24 LAB — LIPASE, BLOOD: Lipase: 22 U/L (ref 11–51)

## 2018-07-24 MED ORDER — HYDROMORPHONE HCL 1 MG/ML IJ SOLN: 0.5000 mg | Freq: Once | INTRAMUSCULAR | Status: DC

## 2018-07-24 MED ORDER — METOCLOPRAMIDE HCL 5 MG/ML IJ SOLN
10.0000 mg | Freq: Once | INTRAMUSCULAR | Status: AC
  Administered 2018-07-24: 10 mg via INTRAVENOUS
  Filled 2018-07-24: qty 2

## 2018-07-24 MED ORDER — HYDROMORPHONE HCL 1 MG/ML IJ SOLN
0.5000 mg | Freq: Once | INTRAMUSCULAR | Status: AC
  Administered 2018-07-24: 0.5 mg via INTRAVENOUS
  Filled 2018-07-24: qty 1

## 2018-07-24 MED ORDER — SODIUM CHLORIDE 0.9 % IV BOLUS
1000.0000 mL | Freq: Once | INTRAVENOUS | Status: AC
  Administered 2018-07-24: 1000 mL via INTRAVENOUS

## 2018-07-24 MED ORDER — CEFTRIAXONE SODIUM 250 MG IJ SOLR
250.0000 mg | Freq: Once | INTRAMUSCULAR | Status: AC
  Administered 2018-07-25: 250 mg via INTRAMUSCULAR
  Filled 2018-07-24: qty 250

## 2018-07-24 MED ORDER — DIPHENHYDRAMINE HCL 50 MG/ML IJ SOLN
25.0000 mg | Freq: Once | INTRAMUSCULAR | Status: AC
  Administered 2018-07-24: 25 mg via INTRAVENOUS
  Filled 2018-07-24: qty 1

## 2018-07-24 MED ORDER — PROMETHAZINE HCL 25 MG/ML IJ SOLN
12.5000 mg | Freq: Once | INTRAMUSCULAR | Status: AC
  Administered 2018-07-24: 12.5 mg via INTRAVENOUS
  Filled 2018-07-24: qty 1

## 2018-07-24 MED ORDER — ALUM & MAG HYDROXIDE-SIMETH 200-200-20 MG/5ML PO SUSP: 15.0000 mL | Freq: Once | ORAL | Status: DC

## 2018-07-24 MED ORDER — AZITHROMYCIN 250 MG PO TABS
1000.0000 mg | ORAL_TABLET | Freq: Once | ORAL | Status: AC
  Administered 2018-07-25: 1000 mg via ORAL
  Filled 2018-07-24: qty 4

## 2018-07-24 NOTE — ED Provider Notes (Signed)
[facility] Provider Note   CSN: [id number] Arrival date & time: 07/24/18  1401    History   Chief Complaint Chief Complaint  Patient presents with  . Abdominal Pain  . Emesis  . Diarrhea    HPI Angela Flynn is a 23 y.o. female.     HPI  Patient is a 23 yo female with a history of allergic rhinitis, abscesses growing out MRSA, PID, and abdominal surgical history significant for laparoscopic cholecystectomy and laparoscopic [facility]abdominal abscess drainage [facility] in 2012 presenting for generalized abdominal pain.  Patient describes it as crampy and colicky in nature.  Patient reports that it is so severe that she has not been able to eat over the past 3 days.  Patient reports anytime she tries to eat or drink anything she experiences vomiting.  Patient reports that early in the course of this illness she felt that she had the urge to have a bowel movement but could not.  Reports unintentional to the bathroom she released watery stool.  She reports that she is also passing gas.  She does have a recent exposure to an individual with C. difficile colitis.  Patient denies any fevers or chills over the course of this illness.  She denies any vaginal bleeding or vaginal discharge.  She was evaluated twice at [facility] in the past 48 hours and had confirmation of intrauterine products of conception, questionable viability on 07-22-2018.  No cardiac activity was noted.  Patient also had a right upper quadrant ultrasound yesterday which showed chronic CBD dilatation but no acute findings. Denies significant vaginal discharge. Reports 2 female sexual partners in the last 6 months and denies concern about STI.   Past Medical History:  Diagnosis Date  . Allergy    seasonal, dusts  . Herpes genitalis   . MRSA infection    abscesses - history of    Patient Active Problem List   Diagnosis Date Noted  . Herpes genitalis 02/21/2018   . Gonorrhea 03/29/2015  . Pelvic inflammatory disease 03/28/2015  . SIRS (systemic inflammatory response syndrome) (HCC) 03/28/2015  . Leukocytosis 03/28/2015  . Hypokalemia 03/28/2015    Past Surgical History:  Procedure Laterality Date  . CHOLECYSTECTOMY, LAPAROSCOPIC    . TONSILLECTOMY       OB History    Gravida  3   Para      Term      Preterm      AB  2   Living  0     SAB  2   TAB      Ectopic      Multiple      Live Births               Home Medications    Prior to Admission medications   Medication Sig Start Date End Date Taking? Authorizing Provider  acetaminophen (TYLENOL) 500 MG tablet Take 500 mg by mouth every 6 (six) hours as needed for mild pain or headache.    Yes [provider]  ondansetron (ZOFRAN ODT) 4 MG disintegrating tablet Take 1 tablet (4 mg total) by mouth every 8 (eight) hours as needed for nausea or vomiting. 07/23/18  Yes Emily FilbertWilliams, Jonathan E, MD  polyethylene glycol (MIRALAX / GLYCOLAX) 17 g packet Take 17 g by mouth daily. 07/23/18  Yes Emily FilbertWilliams, Jonathan E, MD  ipratropium (ATROVENT) 0.06 % nasal spray Place 2 sprays into both nostrils 4 (four) times daily. Patient  not taking: Reported on 07/23/2018 04/17/18   Galen Manila, NP  metoCLOPramide (REGLAN) 5 MG tablet Take 1 tablet (5 mg total) by mouth every 8 (eight) hours as needed for nausea. Patient not taking: Reported on 07/23/2018 07/22/18 07/22/19  Jene Every, MD  SUMAtriptan (IMITREX) 50 MG tablet Take 1 tablet (50 mg total) by mouth every 2 (two) hours as needed for headache. May repeat in 2 hours if headache persists or recurs. Patient not taking: Reported on 04/17/2018 02/28/18   Galen Manila, NP    Family History Family History  Problem Relation Age of Onset  . Bipolar disorder Mother   . Heart disease Sister   . Diabetes Maternal Grandmother     Social History Social History   Tobacco Use  . Smoking status: Current Every Day Smoker     Types: Cigars  . Smokeless tobacco: Never Used  . Tobacco comment: 2-5 cigars per day on average, which pt uses to smoke marijuana  Substance Use Topics  . Alcohol use: No  . Drug use: Yes    Types: Marijuana    Comment: 2-5 x per day     Allergies   Patient has no known allergies.   Review of Systems Review of Systems  Constitutional: Negative for chills and fever.  HENT: Negative for congestion and sore throat.   Eyes: Negative for visual disturbance.  Respiratory: Negative for cough, chest tightness and shortness of breath.   Cardiovascular: Negative for chest pain.  Gastrointestinal: Positive for abdominal pain, diarrhea, nausea and vomiting. Negative for constipation.  Genitourinary: Positive for pelvic pain. Negative for dysuria, flank pain, vaginal bleeding and vaginal discharge.  Musculoskeletal: Negative for back pain and myalgias.  Skin: Negative for rash.  Neurological: Negative for dizziness and light-headedness.     Physical Exam Updated Vital Signs Wt 121.6 kg   LMP 06/14/2018   BMI 39.59 kg/m   Physical Exam Vitals signs and nursing note reviewed.  Constitutional:      General: She is not in acute distress.    Appearance: She is well-developed.  HENT:     Head: Normocephalic and atraumatic.  Eyes:     Conjunctiva/sclera: Conjunctivae normal.     Pupils: Pupils are equal, round, and reactive to light.  Neck:     Musculoskeletal: Normal range of motion and neck supple.  Cardiovascular:     Rate and Rhythm: Normal rate and regular rhythm.     Heart sounds: S1 normal and S2 normal. No murmur.  Pulmonary:     Effort: Pulmonary effort is normal.     Breath sounds: Normal breath sounds. No wheezing or rales.  Abdominal:     General: Bowel sounds are normal. There is no distension.     Palpations: Abdomen is soft.     Tenderness: There is abdominal tenderness in the right upper quadrant, epigastric area and suprapubic area. There is no guarding.  Positive signs include Murphy's sign.     Comments: Nonsurgical abdomen.  Patient is significantly tender in multiple areas throughout the abdomen.  She does also have a positive Murphy sign.  Genitourinary:    Comments: Exam performed with RN chaperone present. Thick and creamy vaginal discharge present in vaginal vault. Cervix closed and nonerythematous. No CMT but patient has significant discomfort of palpation of uterine fundus and bilateral adnexa.  Musculoskeletal: Normal range of motion.        General: No deformity.  Lymphadenopathy:     Cervical: No cervical adenopathy.  Skin:    General: Skin is warm and dry.     Findings: No erythema or rash.  Neurological:     Mental Status: She is alert.     Comments: Cranial nerves grossly intact. Patient moves extremities symmetrically and with good coordination.  Psychiatric:        Behavior: Behavior normal.        Thought Content: Thought content normal.        Judgment: Judgment normal.      ED Treatments / Results  Labs (all labs ordered are listed, but only abnormal results are displayed) Labs Reviewed  WET PREP, GENITAL - Abnormal; Notable for the following components:      Result Value   WBC, Wet Prep HPF POC MANY (*)    All other components within normal limits  HCG, QUANTITATIVE, PREGNANCY - Abnormal; Notable for the following components:   hCG, Beta Chain, Quant, S 29,525 (*)    All other components within normal limits  URINALYSIS, ROUTINE W REFLEX MICROSCOPIC - Abnormal; Notable for the following components:   APPearance HAZY (*)    Ketones, ur 80 (*)    All other components within normal limits  CBC WITH DIFFERENTIAL/PLATELET - Abnormal; Notable for the following components:   WBC 12.4 (*)    Neutro Abs 9.2 (*)    All other components within normal limits  COMPREHENSIVE METABOLIC PANEL - Abnormal; Notable for the following components:   CO2 19 (*)    All other components within normal limits  GASTROINTESTINAL  PANEL BY PCR, STOOL (REPLACES STOOL CULTURE)  C DIFFICILE QUICK SCREEN W PCR REFLEX  LIPASE, BLOOD  LACTIC ACID, PLASMA  LACTIC ACID, PLASMA  RPR  HIV ANTIBODY (ROUTINE TESTING W REFLEX)  GC/CHLAMYDIA PROBE AMP ([place]) NOT AT [facility]    EKG Flynn  Radiology US Abdomen Limited Ruq  Result Date: 07/23/2018 CLINICAL DATA:  23 year old female with a 5 day history of abdominal pain. EXAM: ULTRASOUND ABDOMEN LIMITED RIGHT UPPER QUADRANT COMPARISON:  Early obstetrical ultrasound performed yesterday. Prior CT scan of the abdomen and pelvis 08/12/2017 FINDINGS: Gallbladder: Surgically absent. Common bile duct: Diameter: Mildly dilated at 9 mm Liver: No focal lesion identified. Within normal limits in parenchymal echogenicity. Portal vein is patent on color Doppler imaging with normal direction of blood flow towards the liver. IVC:  No abnormality visualized. Pancreas:   Visualized portion unremarkable. Right Kidney: Length: 9.6 cm Echogenicity within normal limits. No mass or hydronephrosis visualized. Abdominal aorta:  No aneurysm visualized. Other findings: Flynn. IMPRESSION: 1. No significant interval change in the degree of mild dilation of the common bile duct measuring up to 9 mm compared to prior CT imaging from 08/12/2017. 2. Surgical changes of prior cholecystectomy. Electronically Signed   By: Malachy Moan M.D.   On: 07/23/2018 12:19    Procedures Procedures (including critical care time)  Medications Ordered in ED Medications  sodium chloride 0.9 % bolus 1,000 mL (has no administration in time range)  promethazine (PHENERGAN) injection 12.5 mg (has no administration in time range)  HYDROmorphone (DILAUDID) injection 0.5 mg (has no administration in time range)     Initial Impression / Assessment and Plan / ED Course  I have reviewed the triage vital signs and the nursing notes.  Pertinent labs & imaging results that were available during my care of the patient were reviewed  by me and considered in my medical decision making (see chart for details).  Clinical Course as of Jul 24 4  Tue Jul 24, 2018  1531 Spoke with pharmacy who recommend phenergan + dilaudid. Appreciate their involvement.    [AM]  1542 Spoke with Dr. Adrian Blackwater, OB/GYN faculty practice who states that if CT scan of abdomen and pelvis with contrast is the appropriate modality necessary to evaluate the patient, there is minimal risk.  The only modality he would not recommend in evaluating acute abdomen is MRI with contrast.  Appreciate his involvement. Case discussed with Dr. Jeraldine Loots who is also in agreement regarding CT scan.    [AM]  1719 Likley 2/2 vomiting/fluid depletion.  CO2(!): 19 [AM]  1920 Spoke with CT technician who states that pt does not want to receive dye. Discussed that we can scan with out contrast.   [AM]  2103 Discussed case with Dr. Adrian Blackwater. States he would not recommend MAU transfer at this time due to pain primary being in upper abdomen and unlikely pregnancy related.    [AM]  2304 Discussed case with Dr. Adrian Blackwater again and he states that treating with Azithromycin and Rocephin right now is reasonable but would not initiate extended PID treatment at this time.    [AM]  2340 Case initially discussed with Dr. Julian Reil of hospital medicine who declines admission. Recommends strictly OB case. Appreciate his involvement.    [AM]  2353 Case discussed with Dr. Adrian Blackwater of OBGYN who was operating in a C-section but felt that this case continues to be non-OB. Appreciate his involvement. Case again discussed with Dr. Julian Reil who also spoke with attending physician Dr. Rodena Medin who still declined admission. Appreciate his involvement.    [AM]    Clinical Course User Index [AM] Elisha Ponder, PA-C       Patient is nontoxic-appearing, but she appears very uncomfortable with colicky abdominal pain.  She has a nonsurgical abdomen.  Differential diagnosis includes biliary stone,  appendicitis, intra-abdominal abscess, colitis.  Per discussion with OB/GYN, will proceed with CT abdomen and pelvis with contrast. Patient counseled on risks and benefits of this and signed waiver. Will reassess hCG quantitative and repeat labs today.  Labwork demonstrating slight increase in leukocytosis of 12.4.  Patient has overall normal electrolytes.  Bicarb is slightly low, likely secondary to poor p.o. intake.  Urinalysis continues to show no evidence of infection.  Ketones are 80 indicating lack of p.o. intake.  Lipase is normal.  Lactic acid is normal.  Quantitative beta-hCG is slightly higher than previous 2 days, but not doubling.  Wet prep performed and patient has many WBCs but no other findings.  GC chlamydia is pending.  CT abdomen and pelvis demonstrated no acute findings.  Patient did an CT refused the contrast, but there does not appear to be any significant surgical abnormalities in the abdomen and pelvis.  I had an extensive conversation with the patient regarding her imaging and reassuring findings.  Engaged in shared decision making on how to proceed further. Patient continued to have inability to tolerate p.o., requiring multiple antiemetics.  At this time, I am suspicious for hyperemesis gravidarum versus early pregnancy loss prior to bleeding.  Pt also being treated with Rocephin and Azithromycin. Concerned that patient may need repeat abdominal exams given the extent of her pain.  Will discuss observation with hospitalist.  Per multiple discussions with hospital medicine and OB, there was difficulty obtaining disposition due to patient's pregnancy status and features of patient's presentation both OB and non-OB. Patient independently evaluated by attending Dr. Kristine Royal and Attending Dr. Kristine Royal involved in consultation phone calls.  Patient case signed out to [name], PA-C to further discuss with hospital medicine and OB after OB finishes surgical case. Appreciate  their involvement.   Final Clinical Impressions(s) / ED Diagnoses   Final diagnoses:  Generalized abdominal pain  Nausea and vomiting, intractability of vomiting not specified, unspecified vomiting type    ED Discharge Orders    Flynn       Delia Chimes 07/25/18 0011    Gerhard Munch, MD 07/26/18 [phone]

## 2018-07-24 NOTE — ED Provider Notes (Signed)
23 year old female received at sign out from [facility] pending admission. Per her HPI:   "Patient is a 23 yo female with a history of allergic rhinitis, abscesses growing out MRSA, PID, and abdominal surgical history significant for laparoscopic cholecystectomy and laparoscopic intraabdominal abscess drainage at [facility] in 2012 presenting for generalized abdominal pain.  Patient describes it as crampy and colicky in nature.  Patient reports that it is so severe that she has not been able to eat over the past 3 days.  Patient reports anytime she tries to eat or drink anything she experiences vomiting.  Patient reports that early in the course of this illness she felt that she had the urge to have a bowel movement but could not.  Reports unintentional to the bathroom she released watery stool.  She reports that she is also passing gas.  She does have a recent exposure to an individual with C. difficile colitis.  Patient denies any fevers or chills over the course of this illness.  She denies any vaginal bleeding or vaginal discharge.  She was evaluated twice at [facility] in the past 48 hours and had confirmation of intrauterine products of conception, questionable viability on 07-22-2018.  No cardiac activity was noted.  Patient also had a right upper quadrant ultrasound yesterday which showed chronic CBD dilatation but no acute findings."  The patient reports that she was smoking marijuana 3-5 times daily, but has not smoked marijuana since finding out that she was pregnant.  She does report a history of frequent NSAID use, including ibuprofen and Goody powder, but reports that she regularly takes this medication with food.  She denies melena or hematochezia.  She reports that she was prescribed laxatives, but was having diarrhea prior to initiating this medication.  No recent antibiotic use.   Physical Exam  BP 135/68 (BP Location: Right Arm)   Pulse 85   Temp 98.6 F (37 C) (Oral)    Resp 18   Wt 121.6 kg   LMP 06/14/2018   SpO2 100%   BMI 39.59 kg/m   Physical Exam Vitals signs and nursing note reviewed.  Constitutional:      General: She is not in acute distress.    Comments: Uncomfortable appearing  HENT:     Head: Normocephalic.  Eyes:     Conjunctiva/sclera: Conjunctivae normal.  Neck:     Musculoskeletal: Normal range of motion and neck supple. No neck rigidity.  Cardiovascular:     Rate and Rhythm: Normal rate and regular rhythm.     Heart sounds: No murmur. No friction rub. No gallop.   Pulmonary:     Effort: Pulmonary effort is normal. No respiratory distress.     Breath sounds: No stridor. No wheezing, rhonchi or rales.  Chest:     Chest wall: No tenderness.  Abdominal:     General: There is no distension.     Palpations: Abdomen is soft. There is no mass.     Tenderness: There is abdominal tenderness. There is guarding. There is no right CVA tenderness, left CVA tenderness or rebound.     Comments: Focal tenderness in the epigastric region with guarding.  No rebound.  Negative Murphy sign.  Lower abdomen is soft, nondistended, and nontender.  No CVA tenderness bilaterally.  Musculoskeletal:     Right lower leg: No edema.     Left lower leg: No edema.  Lymphadenopathy:     Cervical: No cervical adenopathy.  Skin:    General:  Skin is warm.     Findings: No rash.  Neurological:     Mental Status: She is alert.  Psychiatric:        Behavior: Behavior normal.     ED Course/Procedures   Clinical Course as of Jul 25 630  Tue Jul 24, 2018  1531 Spoke with pharmacy who recommend phenergan + dilaudid. Appreciate their involvement.    [AM]  1542 Spoke with Dr. Adrian Blackwater, OB/GYN faculty practice who states that if CT scan of abdomen and pelvis with contrast is the appropriate modality necessary to evaluate the patient, there is minimal risk.  The only modality he would not recommend in evaluating acute abdomen is MRI with contrast.  Appreciate  his involvement. Case discussed with Dr. Jeraldine Loots who is also in agreement regarding CT scan.    [AM]  1719 Likley 2/2 vomiting/fluid depletion.  CO2(!): 19 [AM]  1920 Spoke with CT technician who states that pt does not want to receive dye. Discussed that we can scan with out contrast.   [AM]  2103 Discussed case with Dr. Adrian Blackwater. States he would not recommend MAU transfer at this time due to pain primary being in upper abdomen and unlikely pregnancy related.    [AM]  2304 Discussed case with Dr. Adrian Blackwater again and he states that treating with Azithromycin and Rocephin right now is reasonable but would not initiate extended PID treatment at this time.    [AM]  2340 Case initially discussed with Dr. Julian Reil of hospital medicine who declines admission. Recommends strictly OB case. Appreciate his involvement.    [AM]  2353 Case discussed with Dr. Adrian Blackwater of OBGYN who was operating in a C-section but felt that this case continues to be non-OB. Appreciate his involvement. Case again discussed with Dr. Julian Reil who also spoke with attending physician Dr. Rodena Medin who still declined admission. Appreciate his involvement.    [AM]    Clinical Course User Index [AM] Elisha Ponder, PA-C    Procedures  MDM   23 year old female received at signout from [facility] pending admission. PMH includes with a history of allergic rhinitis, abscesses growing out MRSA, PID, and abdominal surgical history significant for laparoscopic cholecystectomy and laparoscopic intraabdominal abscess drainage at [facility] in 2012.   On exam, patient is focally tender in the epigastric region with guarding, but no rebound.  She appears uncomfortable.  Differential diagnosis includes peptic ulcer disease, but less likely as the patient's pain has been ongoing for 3 days and she is endorsing no melena or hematochezia.  Hemoglobin is stable.  Low suspicion for pancreatitis as lipase is normal.  She has previously had cholecystectomy and  has no right upper quadrant tenderness.  CT ordered by PA Dayton Scrape was unremarkable.  C. difficile is a concern, but less likely as the patient has had no recent antibiotic use.  Could be hyperemesis gravidarum, but again, less likely secondary to epigastric pain.  Differential diagnosis also includes viral gastroenteritis, and retained abortion.    Spoke with Dr. Julian Reil, hospitalist, and requested a medical consult for the patient in the ER.  He expresses concern that if the patient's hCG is not doubling then there is concern for loss of pregnancy.  I also spoke with Dr. Adrian Blackwater of OB/GYN and have requested a consult in the ER.  There is concern for failed pregnancy and he recommends repeating pelvic ultrasound. Dr. Adrian Blackwater recommends pain control with Bentyl and opiates if necessary and avoiding NSAIDs during the patient's first trimester.  He recommends  a D5 LR bolus to limited ketones to help with nausea.  He recommends Phenergan and Reglan for nausea and only using Zofran as a second line medications as the patient is within her first trimester.  He recommends gastric protection with either an H2 blocker or PPI, both of which are safe in pregnancy.  Dr. Julian ReilGardner has agreed to admission with OB/GYN following.  Both medical and OB/GYN consults and admission are appreciated. The patient appears reasonably stabilized for admission considering the current resources, flow, and capabilities available in the ED at this time, and I doubt any other [facility] requiring further screening and/or treatment in the ED prior to admission.      Frederik PearMcDonald, Mia A, PA-C 07/25/18 [phone]    Ward, Layla MawKristen N, DO 07/25/18 [phone]

## 2018-07-24 NOTE — ED Triage Notes (Addendum)
Pt in with c/o low abd pain x 2 days. States emesis and diarrhea as well. Is [redacted]wks pregnant, denies any fevers or abnormal vaginal d/c. Seen for same at [facility] x 2

## 2018-07-25 ENCOUNTER — Observation Stay (HOSPITAL_COMMUNITY): Payer: Medicaid Other

## 2018-07-25 ENCOUNTER — Other Ambulatory Visit: Payer: Self-pay

## 2018-07-25 DIAGNOSIS — O99011 Anemia complicating pregnancy, first trimester: Secondary | ICD-10-CM | POA: Diagnosis present

## 2018-07-25 DIAGNOSIS — R1013 Epigastric pain: Secondary | ICD-10-CM

## 2018-07-25 DIAGNOSIS — E876 Hypokalemia: Secondary | ICD-10-CM | POA: Diagnosis not present

## 2018-07-25 DIAGNOSIS — R109 Unspecified abdominal pain: Secondary | ICD-10-CM | POA: Diagnosis not present

## 2018-07-25 DIAGNOSIS — D649 Anemia, unspecified: Secondary | ICD-10-CM | POA: Diagnosis present

## 2018-07-25 DIAGNOSIS — Z79899 Other long term (current) drug therapy: Secondary | ICD-10-CM | POA: Diagnosis not present

## 2018-07-25 DIAGNOSIS — R112 Nausea with vomiting, unspecified: Secondary | ICD-10-CM

## 2018-07-25 DIAGNOSIS — O99211 Obesity complicating pregnancy, first trimester: Secondary | ICD-10-CM | POA: Diagnosis present

## 2018-07-25 DIAGNOSIS — O3680X0 Pregnancy with inconclusive fetal viability, not applicable or unspecified: Secondary | ICD-10-CM | POA: Diagnosis present

## 2018-07-25 DIAGNOSIS — Z3A01 Less than 8 weeks gestation of pregnancy: Secondary | ICD-10-CM | POA: Diagnosis not present

## 2018-07-25 DIAGNOSIS — O26891 Other specified pregnancy related conditions, first trimester: Principal | ICD-10-CM

## 2018-07-25 DIAGNOSIS — O99331 Smoking (tobacco) complicating pregnancy, first trimester: Secondary | ICD-10-CM | POA: Diagnosis present

## 2018-07-25 DIAGNOSIS — O468X1 Other antepartum hemorrhage, first trimester: Secondary | ICD-10-CM | POA: Diagnosis present

## 2018-07-25 DIAGNOSIS — R1084 Generalized abdominal pain: Secondary | ICD-10-CM | POA: Diagnosis present

## 2018-07-25 DIAGNOSIS — O99281 Endocrine, nutritional and metabolic diseases complicating pregnancy, first trimester: Secondary | ICD-10-CM | POA: Diagnosis present

## 2018-07-25 DIAGNOSIS — N898 Other specified noninflammatory disorders of vagina: Secondary | ICD-10-CM

## 2018-07-25 DIAGNOSIS — Z9049 Acquired absence of other specified parts of digestive tract: Secondary | ICD-10-CM | POA: Diagnosis not present

## 2018-07-25 DIAGNOSIS — F17290 Nicotine dependence, other tobacco product, uncomplicated: Secondary | ICD-10-CM | POA: Diagnosis present

## 2018-07-25 LAB — BASIC METABOLIC PANEL
Anion gap: 8 (ref 5–15)
BUN: 5 mg/dL — ABNORMAL LOW (ref 6–20)
CO2: 25 mmol/L (ref 22–32)
Calcium: 8.9 mg/dL (ref 8.9–10.3)
Chloride: 105 mmol/L (ref 98–111)
Creatinine, Ser: 0.61 mg/dL (ref 0.44–1.00)
GFR calc Af Amer: >60 mL/min (ref >60)
GFR calc non Af Amer: >60 mL/min (ref >60)
Glucose, Bld: 109 mg/dL — ABNORMAL HIGH (ref 70–99)
Potassium: 3.9 mmol/L (ref 3.5–5.1)
Sodium: 138 mmol/L (ref 135–145)

## 2018-07-25 LAB — CBC
HCT: 36.5 % (ref 36.0–46.0)
Hemoglobin: 11.9 g/dL — ABNORMAL LOW (ref 12.0–15.0)
MCH: 29.5 pg (ref 26.0–34.0)
MCHC: 32.6 g/dL (ref 30.0–36.0)
MCV: 90.3 fL (ref 80.0–100.0)
Platelets: 263 10*3/uL (ref 150–400)
RBC: 4.04 MIL/uL (ref 3.87–5.11)
RDW: 12.4 % (ref 11.5–15.5)
WBC: 10.6 10*3/uL — ABNORMAL HIGH (ref 4.0–10.5)
nRBC: 0 % (ref 0.0–0.2)

## 2018-07-25 LAB — GC/CHLAMYDIA PROBE AMP (~~LOC~~) NOT AT ARMC
Chlamydia: NEGATIVE
Neisseria Gonorrhea: NEGATIVE

## 2018-07-25 LAB — HIV ANTIBODY (ROUTINE TESTING W REFLEX): HIV Screen 4th Generation wRfx: NONREACTIVE

## 2018-07-25 MED ORDER — DEXTROSE IN LACTATED RINGERS 5 % IV SOLN
INTRAVENOUS | Status: DC
  Administered 2018-07-25 – 2018-07-26 (×5): via INTRAVENOUS

## 2018-07-25 MED ORDER — PANTOPRAZOLE SODIUM 40 MG IV SOLR
40.0000 mg | Freq: Two times a day (BID) | INTRAVENOUS | Status: DC
  Administered 2018-07-25 – 2018-07-27 (×7): 40 mg via INTRAVENOUS
  Filled 2018-07-25 (×8): qty 40

## 2018-07-25 MED ORDER — ACETAMINOPHEN 325 MG PO TABS: 650.0000 mg | ORAL_TABLET | Freq: Four times a day (QID) | ORAL | Status: DC | PRN

## 2018-07-25 MED ORDER — ENOXAPARIN SODIUM 40 MG/0.4ML ~~LOC~~ SOLN
40.0000 mg | Freq: Every day | SUBCUTANEOUS | Status: DC
  Filled 2018-07-25 (×3): qty 0.4

## 2018-07-25 MED ORDER — HYDROMORPHONE HCL 1 MG/ML IJ SOLN
0.5000 mg | INTRAMUSCULAR | Status: DC | PRN
  Administered 2018-07-25 (×2): 1 mg via INTRAVENOUS
  Administered 2018-07-25: 0.5 mg via INTRAVENOUS
  Administered 2018-07-25 – 2018-07-26 (×4): 1 mg via INTRAVENOUS
  Filled 2018-07-25 (×7): qty 1

## 2018-07-25 MED ORDER — PROMETHAZINE HCL 25 MG/ML IJ SOLN
12.5000 mg | Freq: Four times a day (QID) | INTRAMUSCULAR | Status: DC | PRN
  Administered 2018-07-26 (×2): 25 mg via INTRAVENOUS
  Administered 2018-07-27: 12.5 mg via INTRAVENOUS
  Filled 2018-07-25 (×4): qty 1

## 2018-07-25 MED ORDER — DICYCLOMINE HCL 10 MG/ML IM SOLN
20.0000 mg | Freq: Once | INTRAMUSCULAR | Status: DC
  Filled 2018-07-25: qty 2

## 2018-07-25 MED ORDER — ONDANSETRON HCL 4 MG PO TABS: 4.0000 mg | ORAL_TABLET | Freq: Four times a day (QID) | ORAL | Status: DC | PRN

## 2018-07-25 MED ORDER — HYDROMORPHONE HCL 1 MG/ML IJ SOLN
1.0000 mg | Freq: Once | INTRAMUSCULAR | Status: AC
  Administered 2018-07-25: 1 mg via INTRAVENOUS
  Filled 2018-07-25: qty 1

## 2018-07-25 MED ORDER — ONDANSETRON HCL 4 MG/2ML IJ SOLN
4.0000 mg | Freq: Four times a day (QID) | INTRAMUSCULAR | Status: DC | PRN
  Administered 2018-07-25 – 2018-07-26 (×5): 4 mg via INTRAVENOUS
  Filled 2018-07-25 (×5): qty 2

## 2018-07-25 MED ORDER — DEXTROSE 5 % IN LACTATED RINGERS IV BOLUS
1000.0000 mL | Freq: Once | INTRAVENOUS | Status: AC
  Administered 2018-07-25: 1000 mL via INTRAVENOUS

## 2018-07-25 MED ORDER — ACETAMINOPHEN 650 MG RE SUPP: 650.0000 mg | Freq: Four times a day (QID) | RECTAL | Status: DC | PRN

## 2018-07-25 MED ORDER — DICYCLOMINE HCL 10 MG PO CAPS
10.0000 mg | ORAL_CAPSULE | Freq: Three times a day (TID) | ORAL | Status: DC | PRN
  Administered 2018-07-25: 10 mg via ORAL
  Filled 2018-07-25: qty 1

## 2018-07-25 NOTE — ED Notes (Signed)
Pt refusing [place] at this time.... Will be transported to [facility]

## 2018-07-25 NOTE — ED Notes (Signed)
Dr Julian Reil text paged about pt refusing [place]

## 2018-07-25 NOTE — Progress Notes (Signed)
Patient admitted after midnight, please see H&P.  Not yet improving.  Continue IVF and other symptomatic care.  OBGYN consult appreciated Hope for d/c in the AM  [facility]

## 2018-07-25 NOTE — ED Notes (Signed)
ED TO INPATIENT HANDOFF REPORT  ED Nurse Name and Phone #: Delorise Jacksonori [phone]  S Name/Age/Gender Angela Flynn 23 y.o. female Room/Bed: 012C/012C  Code Status   Code Status: Full Code  Home/SNF/Other Home Patient oriented to: self Is this baseline? Yes   Triage Complete: Triage complete  Chief Complaint ABD PAIN  Triage Note Pt in with c/o low abd pain x 2 days. States emesis and diarrhea as well. Is [redacted]wks pregnant, denies any fevers or abnormal vaginal d/c. [facility]   Allergies No Known Allergies  Level of Care/Admitting Diagnosis ED Disposition    ED Disposition Condition Comment   Admit  Hospital Area: [facility] [[id number]]  Level of Care: Med-Surg [16]  I expect the patient will be discharged within 24 hours: No (not a candidate for 5C-Observation unit)  Diagnosis: Epigastric abdominal pain affecting pregnancy in first trimester [[id number]][[id number]]  Admitting Physician: Hillary BowGARDNER, JARED M [phone][[id number]]  Attending Physician: Hillary BowGARDNER, JARED M [[id number]]  PT Class (Do Not Modify): Observation [104]  PT Acc Code (Do Not Modify): Observation [10022]       B Medical/Surgery History Past Medical History:  Diagnosis Date  . Allergy    seasonal, dusts  . Herpes genitalis   . MRSA infection    abscesses - history of   Past Surgical History:  Procedure Laterality Date  . CHOLECYSTECTOMY, LAPAROSCOPIC    . TONSILLECTOMY       A IV Location/Drains/Wounds Patient Lines/Drains/Airways Status   Active Line/Drains/Airways    Name:   Placement date:   Placement time:   Site:   Days:   Peripheral IV 07/24/18 Left Forearm   07/24/18    1542    Forearm   1          Intake/Output Last 24 hours No intake or output data in the 24 hours ending 07/25/18 0119  Labs/Imaging Results for orders placed or performed during the hospital encounter of 07/24/18 (from the past 48 hour(s))  Urinalysis, Routine w reflex microscopic     Status: Abnormal   Collection  Time: 07/24/18  3:23 PM  Result Value Ref Range   Color, Urine YELLOW YELLOW   APPearance HAZY (A) CLEAR   Specific Gravity, Urine 1.024 1.005 - 1.030   pH 5.0 5.0 - 8.0   Glucose, UA NEGATIVE NEGATIVE mg/dL   Hgb urine dipstick NEGATIVE NEGATIVE   Bilirubin Urine NEGATIVE NEGATIVE   Ketones, ur 80 (A) NEGATIVE mg/dL   Protein, ur NEGATIVE NEGATIVE mg/dL   Nitrite NEGATIVE NEGATIVE   Leukocytes,Ua NEGATIVE NEGATIVE    Comment: Performed at [facility], [address] [place], [place] [id number]  CBC with Differential     Status: Abnormal   Collection Time: 07/24/18  3:23 PM  Result Value Ref Range   WBC 12.4 (H) 4.0 - 10.5 K/uL   RBC 4.81 3.87 - 5.11 MIL/uL   Hemoglobin 14.1 12.0 - 15.0 g/dL   HCT 95.643.6 21.336.0 - 08.646.0 %   MCV 90.6 80.0 - 100.0 fL   MCH 29.3 26.0 - 34.0 pg   MCHC 32.3 30.0 - 36.0 g/dL   RDW 57.812.3 46.911.5 - 62.915.5 %   Platelets 348 150 - 400 K/uL   nRBC 0.0 0.0 - 0.2 %   Neutrophils Relative % 74 %   Neutro Abs 9.2 (H) 1.7 - 7.7 K/uL   Lymphocytes Relative 21 %   Lymphs Abs 2.5 0.7 - 4.0 K/uL   Monocytes  Relative 5 %   Monocytes Absolute 0.6 0.1 - 1.0 K/uL   Eosinophils Relative 0 %   Eosinophils Absolute 0.0 0.0 - 0.5 K/uL   Basophils Relative 0 %   Basophils Absolute 0.1 0.0 - 0.1 K/uL   Immature Granulocytes 0 %   Abs Immature Granulocytes 0.04 0.00 - 0.07 K/uL    Comment: Performed at [facility], [address], [place], [place] [address]  Comprehensive metabolic panel     Status: Abnormal   Collection Time: 07/24/18  3:23 PM  Result Value Ref Range   Sodium 135 135 - 145 mmol/L   Potassium 3.9 3.5 - 5.1 mmol/L   Chloride 106 98 - 111 mmol/L   CO2 19 (L) 22 - 32 mmol/L   Glucose, Bld 92 70 - 99 mg/dL   BUN 7 6 - 20 mg/dL   Creatinine, Ser 6.04 0.44 - 1.00 mg/dL   Calcium 8.9 8.9 - 54.0 mg/dL   Total Protein 7.3 6.5 - 8.1 g/dL   Albumin 4.1 3.5 - 5.0 g/dL   AST 23 15 - 41 U/L   ALT 17 0 - 44 U/L   Alkaline Phosphatase 78 38 - 126 U/L    Total Bilirubin 1.0 0.3 - 1.2 mg/dL   GFR calc non Af Amer >60 >60 mL/min   GFR calc Af Amer >60 >60 mL/min   Anion gap 10 5 - 15    Comment: Performed at [facility], [address], [place], [place] [address]  Lipase, blood     Status: None   Collection Time: 07/24/18  3:23 PM  Result Value Ref Range   Lipase 22 11 - 51 U/L    Comment: Performed at [facility], [address] [place], [place] [address]  Lactic acid, plasma     Status: None   Collection Time: 07/24/18  3:41 PM  Result Value Ref Range   Lactic Acid, Venous 1.5 0.5 - 1.9 mmol/L    Comment: Performed at [facility], [address], [place], [place] [address]  hCG, quantitative, pregnancy     Status: Abnormal   Collection Time: 07/24/18  3:44 PM  Result Value Ref Range   hCG, Beta Chain, Quant, S 29,525 (H) <5 mIU/mL    Comment:          GEST. AGE      CONC.  (mIU/mL)   <=1 WEEK        5 - 50     2 WEEKS       50 - 500     3 WEEKS       100 - 10,000     4 WEEKS     1,000 - 30,000     5 WEEKS     3,500 - 115,000   6-8 WEEKS     12,000 - 270,000    12 WEEKS     15,000 - 220,000        FEMALE AND NON-PREGNANT FEMALE:     LESS THAN 5 mIU/mL Performed at [facility], [address] [place], [place] [address]   Lactic acid, plasma     Status: None   Collection Time: 07/24/18  9:50 PM  Result Value Ref Range   Lactic Acid, Venous 1.4 0.5 - 1.9 mmol/L    Comment: Performed at [facility], [address] [place], [place] [address]  Wet prep, genital     Status:  Abnormal   Collection Time: 07/24/18 10:40 PM  Result Value Ref Range   Yeast Wet Prep HPF POC NONE SEEN NONE SEEN   Trich, Wet Prep NONE SEEN NONE SEEN   Clue Cells Wet Prep HPF POC NONE SEEN NONE SEEN   WBC, Wet Prep HPF POC MANY (A) NONE SEEN   Sperm NONE SEEN     Comment: Performed at [facility], [address] [place], [place] [address]   Ct Abdomen Pelvis Wo Contrast  Result Date: 07/24/2018 CLINICAL  DATA:  Upper mid abdominal pain worsening over the last 5 days. Patient with known intrauterine gestation. Consent for CT scan obtained by referring physician. EXAM: CT ABDOMEN AND PELVIS WITHOUT CONTRAST TECHNIQUE: Multidetector CT imaging of the abdomen and pelvis was performed following the standard protocol without IV contrast. COMPARISON:  Abdominal ultrasound 07/23/2018.  CT scan 08/12/2017. FINDINGS: Lower chest: Unremarkable. Hepatobiliary: 6 mm low-density lesion in the dome of liver is similar to prior. The liver shows diffusely decreased attenuation suggesting steatosis. Gallbladder surgically absent. No intrahepatic or extrahepatic biliary dilation. Pancreas: No focal mass lesion. No dilatation of the main duct. No intraparenchymal cyst. No peripancreatic edema. Spleen: No splenomegaly. No focal mass lesion. Adrenals/Urinary Tract: No adrenal nodule or mass. Kidneys unremarkable on this noncontrast study. No evidence for hydroureter. The urinary bladder appears normal for the degree of distention. Stomach/Bowel: Stomach is unremarkable. No gastric wall thickening. No evidence of outlet obstruction. Duodenum is normally positioned as is the ligament of Treitz. No small bowel wall thickening. No small bowel dilatation. The terminal ileum is normal. The appendix is normal. No gross colonic mass. No colonic wall thickening. Vascular/Lymphatic: No abdominal aortic aneurysm. No abdominal aortic atherosclerotic calcification. There is no gastrohepatic or hepatoduodenal ligament lymphadenopathy. No intraperitoneal or retroperitoneal lymphadenopathy. No pelvic sidewall lymphadenopathy. Reproductive: Focal fluid collection in the endometrium compatible with tiny gestational sac period. There is no adnexal mass. Other: No intraperitoneal free fluid. Musculoskeletal: No worrisome lytic or sclerotic osseous abnormality. IMPRESSION: 1. No acute findings in the abdomen or pelvis. Specifically, no findings to explain  the patient's history of mid abdominal pain with nausea and vomiting. 2. Hepatic steatosis. Note: I was not contacted about or aware of this case prior to the patient being scanned. Electronically Signed   By: Kennith Center M.D.   On: 07/24/2018 20:04   US Abdomen Limited Ruq  Result Date: 07/23/2018 CLINICAL DATA:  23 year old female with a 5 day history of abdominal pain. EXAM: ULTRASOUND ABDOMEN LIMITED RIGHT UPPER QUADRANT COMPARISON:  Early obstetrical ultrasound performed yesterday. Prior CT scan of the abdomen and pelvis 08/12/2017 FINDINGS: Gallbladder: Surgically absent. Common bile duct: Diameter: Mildly dilated at 9 mm Liver: No focal lesion identified. Within normal limits in parenchymal echogenicity. Portal vein is patent on color Doppler imaging with normal direction of blood flow towards the liver. IVC:  No abnormality visualized. Pancreas:   Visualized portion unremarkable. Right Kidney: Length: 9.6 cm Echogenicity within normal limits. No mass or hydronephrosis visualized. Abdominal aorta:  No aneurysm visualized. Other findings: None. IMPRESSION: 1. No significant interval change in the degree of mild dilation of the common bile duct measuring up to 9 mm compared to prior CT imaging from 08/12/2017. 2. Surgical changes of prior cholecystectomy. Electronically Signed   By: Malachy Moan M.D.   On: 07/23/2018 12:19    Pending Labs Unresulted Labs (From admission, onward)    Start     Ordered   07/25/18 0500  CBC  Tomorrow morning,   R     07/25/18 0057   07/25/18 0500  Basic metabolic panel  Tomorrow morning,   R     07/25/18 0057   07/24/18 2113  RPR  (STI Exposure)  Once,   STAT     07/24/18 2112   07/24/18 2113  HIV antibody  (STI Exposure)  Once,   STAT     07/24/18 2112   07/24/18 1526  Gastrointestinal Panel by PCR , Stool  (Gastrointestinal Panel by PCR, Stool)  Once,   R     07/24/18 1525   07/24/18 1526  C Difficile Quick Screen w PCR reflex  (Gastrointestinal Panel  by PCR, Stool)  Once, for 24 hours,   R     07/24/18 1525          Vitals/Pain Today's Vitals   07/24/18 1700 07/24/18 1715 07/24/18 1730 07/24/18 1745  BP: 133/62 124/65 135/64 132/64  Pulse: 83 83 85 70  Resp:      Temp:      TempSrc:      SpO2: 99% 97% 100% 97%  Weight:      PainSc:        Isolation Precautions Contact and Enteric precautions (UV disinfection)  Medications Medications  alum & mag hydroxide-simeth (MAALOX/MYLANTA) 200-200-20 MG/5ML suspension 15 mL (0 mLs Oral Hold 07/25/18 0008)  dextrose 5% lactated ringers bolus 1,000 mL (has no administration in time range)  HYDROmorphone (DILAUDID) injection 1 mg (has no administration in time range)  dextrose 5 % in lactated ringers infusion (has no administration in time range)  HYDROmorphone (DILAUDID) injection 0.5-1 mg (has no administration in time range)  pantoprazole (PROTONIX) injection 40 mg (has no administration in time range)  dicyclomine (BENTYL) injection 20 mg (has no administration in time range)  promethazine (PHENERGAN) injection 12.5-25 mg (has no administration in time range)  acetaminophen (TYLENOL) tablet 650 mg (has no administration in time range)    Or  acetaminophen (TYLENOL) suppository 650 mg (has no administration in time range)  ondansetron (ZOFRAN) tablet 4 mg (has no administration in time range)    Or  ondansetron (ZOFRAN) injection 4 mg (has no administration in time range)  enoxaparin (LOVENOX) injection 40 mg (has no administration in time range)  sodium chloride 0.9 % bolus 1,000 mL (0 mLs Intravenous Stopped 07/24/18 2123)  promethazine (PHENERGAN) injection 12.5 mg (12.5 mg Intravenous Given 07/24/18 1611)  HYDROmorphone (DILAUDID) injection 0.5 mg (0.5 mg Intravenous Given 07/24/18 1611)  HYDROmorphone (DILAUDID) injection 0.5 mg (0.5 mg Intravenous Given 07/24/18 1758)  promethazine (PHENERGAN) injection 12.5 mg (12.5 mg Intravenous Given 07/24/18 1753)  metoCLOPramide (REGLAN)  injection 10 mg (10 mg Intravenous Given 07/24/18 2136)  diphenhydrAMINE (BENADRYL) injection 25 mg (25 mg Intravenous Given 07/24/18 2136)  cefTRIAXone (ROCEPHIN) injection 250 mg (250 mg Intramuscular Given 07/25/18 0000)  azithromycin (ZITHROMAX) tablet 1,000 mg (1,000 mg Oral Given 07/25/18 0000)    Mobility walks Low fall risk   Focused Assessments    R Recommendations: See Admitting Provider Note  Report given to:   Additional Notes: OB Consulting

## 2018-07-25 NOTE — Progress Notes (Signed)
Patient is requesting to take bentyl via PO instead of the ordered route IM. Paged Dr Julian Reil, orders received to hold off IM bentyl . Patient made aware

## 2018-07-25 NOTE — ED Notes (Signed)
OB at bedisde.

## 2018-07-25 NOTE — Consult Note (Signed)
[name] Consult Note   Consult Date: 07/25/2018  Reason for Consult: Abdominal pain in pregnant patient Referring Physician: Frederik Flynn, Angela Flynn, ED provider   Angela Flynn is an 23 y.o. [phone] female at 5 weeks 6 days by LMP consistent with gestational sac diameter.  Patient presents to the hospital with epigastric and right upper quadrant abdominal pain that started 5 days ago with nausea and vomiting that started 2 days ago.  Patient was seen in the ER on 4/12 and had an ultrasound showing intrauterine gestational sac.  No evidence of fluid in the pelvis and no evidence of ectopic pregnancy.  Patient was prescribed Reglan for nausea and advised to follow-up with OB.  The following day, the patient was seen at [facility] for similar complaints, along with watery diarrhea.  She does have contact with her boyfriend's mother, who has C. difficile.  No recent antibiotic use.  At that time, it was thought that she had a possible ileus and constipation was given bowel regimen, however she was seen later on that morning at [facility] for her abdominal pain just above her umbilicus with vomiting that started that morning.  Her pain is continued today and continues to be epigastric.  She continues to have watery stool with gas.  She has minimal pelvic pain.  The pain is crampy and colicky in nature and has been severe over the past 3 days.  She has had very little to eat and has been having vomiting.  Surgical history: Patient has had laparoscopic cholecystectomy and laparoscopic intra-abdominal abscess drainage in 2012.  Assessment/Plan: 1. Epigastric abdominal pain in first menstrual pregnancy 1. At this point, I am uncertain as to the etiology of the patient's abdominal pain.  CT scan has ruled out most insidious causes.  There is no mass on CT that would be concerning for heterotopic pregnancy.  Additionally,  there is no evidence of free fluid or free air in the pelvis, which would be concerning for ruptured ectopic however this would usually present with pelvic pain that would be persistent in a rigid abdomen, which is not seen on exam. 2. I would recommend pain control with Bentyl, opiates if necessary.  Recommend staying away from NSAIDs during the first trimester, despite this being likely a failed pregnancy. 3. Patient does have vomiting and ketonuria.  Recommend D5 LR bolus to eliminate ketones which should help with the nausea.  Additionally, Phenergan and Reglan can be used for nausea.  I would recommend Zofran being used only for vomiting not controlled with the former antiemetics. 4. As the patient has not had much to eat, I would recommend gastric protection with either H2 blocker or PPI, both of which are safe in pregnancy. 5. A gastro enteritis panel and C. difficile panel are both pending. 2. Pregnancy with uncertain fetal viability 1. At a quant of 29,000, I would expect to see more than a gestational sac.  At this hCG quant, I would not expect to see doubling in 48 hours as the hCG has plateaued. 2. Repeat pelvic ultrasound would be reasonable 3. If still no embryo, this would be highly suspicious for failed pregnancy despite not meeting the SRU consensus guidelines, given the extremely elevated level of the hCG.  It would not be unreasonable to offer Cytotec for the failed pregnancy. 3. Vaginal discharge 1. Patient received treatment for possible cervicitis as the  patient has a history of gonorrhea.  She received IM dose of Rocephin and thousand grams of Zithromax.  Appreciate care of Angela Flynn by her primary team Will continue to follow at this time. Please call [phone] [facility] Attending on call) for any obstetric/gynecologic concerns at any time.  Thank you for involving [place] in the care of this patient.     Pertinent OB/GYN History: Patient's last menstrual period  was 06/14/2018.  Patient Active Problem List   Diagnosis Date Noted  . Epigastric abdominal pain affecting pregnancy in first trimester 07/25/2018  . Herpes genitalis 02/21/2018  . Gonorrhea 03/29/2015  . Pelvic inflammatory disease 03/28/2015  . SIRS (systemic inflammatory response syndrome) (HCC) 03/28/2015  . Leukocytosis 03/28/2015  . Hypokalemia 03/28/2015    Past Medical History:  Diagnosis Date  . Allergy    seasonal, dusts  . Herpes genitalis   . MRSA infection    abscesses - history of    Past Surgical History:  Procedure Laterality Date  . CHOLECYSTECTOMY, LAPAROSCOPIC    . TONSILLECTOMY      Family History  Problem Relation Age of Onset  . Bipolar disorder Mother   . Heart disease Sister   . Diabetes Maternal Grandmother     Social History:  reports that she has been smoking cigars. She has never used smokeless tobacco. She reports current drug use. Drug: Marijuana. She reports that she does not drink alcohol.  Allergies: No Known Allergies  Medications:  I have reviewed the patient's current medications. Prior to Admission: (Not in a hospital admission)  Scheduled: . alum & mag hydroxide-simeth  15 mL Oral Once  . dicyclomine  20 mg Intramuscular Once  . enoxaparin (LOVENOX) injection  40 mg Subcutaneous Daily  .  HYDROmorphone (DILAUDID) injection  1 mg Intravenous Once  . pantoprazole (PROTONIX) IV  40 mg Intravenous Q12H   Continuous: . dextrose 5% lactated ringers    . dextrose 5% lactated ringers     AVW:[id number] **OR** acetaminophen, HYDROmorphone (DILAUDID) injection, ondansetron **OR** ondansetron (ZOFRAN) IV, promethazine  Review of Systems: A comprehensive review of systems was negative.  Focused Physical Examination BP 132/64   Pulse 70   Temp 98.7 F (37.1 C) (Oral)   Resp (!) 22   Wt 121.6 kg   LMP 06/14/2018   SpO2 97%   BMI 39.59 kg/m  GENERAL: Well-developed, well-nourished female in no acute distress.  BREASTS:  deferred Lungs: Nonlabored breathing Heart: 2+ peripheral pulses. Regular heartrate. ABDOMEN: Soft, extremely tender in the epigastric and RUQ area. No rebound tenderness, but mild guarding. No lower pelvic pain on exam. Abdomen nondistended. No organomegaly appreciated on exam. PELVIC: Pelvic exam was deferred at this time. Nontender uterus and adenexa on deep lower quadrant palpation.  EXTREMITIES: No cyanosis, clubbing, or edema, 2+ distal pulses.  Results for orders placed or performed during the hospital encounter of 07/24/18 (from the past 72 hour(s))  Urinalysis, Routine w reflex microscopic     Status: Abnormal   Collection Time: 07/24/18  3:23 PM  Result Value Ref Range   Color, Urine YELLOW YELLOW   APPearance HAZY (A) CLEAR   Specific Gravity, Urine 1.024 1.005 - 1.030   pH 5.0 5.0 - 8.0   Glucose, UA NEGATIVE NEGATIVE mg/dL   Hgb urine dipstick NEGATIVE NEGATIVE   Bilirubin Urine NEGATIVE NEGATIVE   Ketones, ur 80 (A) NEGATIVE mg/dL   Protein, ur NEGATIVE NEGATIVE mg/dL   Nitrite NEGATIVE NEGATIVE   Leukocytes,Ua NEGATIVE NEGATIVE  Comment: Performed at [facility], [address] [place], [place] [address]  CBC with Differential     Status: Abnormal   Collection Time: 07/24/18  3:23 PM  Result Value Ref Range   WBC 12.4 (H) 4.0 - 10.5 K/uL   RBC 4.81 3.87 - 5.11 MIL/uL   Hemoglobin 14.1 12.0 - 15.0 g/dL   HCT 60.443.6 54.036.0 - 98.146.0 %   MCV 90.6 80.0 - 100.0 fL   MCH 29.3 26.0 - 34.0 pg   MCHC 32.3 30.0 - 36.0 g/dL   RDW 19.112.3 47.811.5 - 29.515.5 %   Platelets 348 150 - 400 K/uL   nRBC 0.0 0.0 - 0.2 %   Neutrophils Relative % 74 %   Neutro Abs 9.2 (H) 1.7 - 7.7 K/uL   Lymphocytes Relative 21 %   Lymphs Abs 2.5 0.7 - 4.0 K/uL   Monocytes Relative 5 %   Monocytes Absolute 0.6 0.1 - 1.0 K/uL   Eosinophils Relative 0 %   Eosinophils Absolute 0.0 0.0 - 0.5 K/uL   Basophils Relative 0 %   Basophils Absolute 0.1 0.0 - 0.1 K/uL   Immature Granulocytes 0 %   Abs Immature  Granulocytes 0.04 0.00 - 0.07 K/uL    Comment: Performed at [facility], [address] [place], [place] [address]  Comprehensive metabolic panel     Status: Abnormal   Collection Time: 07/24/18  3:23 PM  Result Value Ref Range   Sodium 135 135 - 145 mmol/L   Potassium 3.9 3.5 - 5.1 mmol/L   Chloride 106 98 - 111 mmol/L   CO2 19 (L) 22 - 32 mmol/L   Glucose, Bld 92 70 - 99 mg/dL   BUN 7 6 - 20 mg/dL   Creatinine, Ser 8.650.56 0.44 - 1.00 mg/dL   Calcium 8.9 8.9 - 78.410.3 mg/dL   Total Protein 7.3 6.5 - 8.1 g/dL   Albumin 4.1 3.5 - 5.0 g/dL   AST 23 15 - 41 U/L   ALT 17 0 - 44 U/L   Alkaline Phosphatase 78 38 - 126 U/L   Total Bilirubin 1.0 0.3 - 1.2 mg/dL   GFR calc non Af Amer >60 >60 mL/min   GFR calc Af Amer >60 >60 mL/min   Anion gap 10 5 - 15    Comment: Performed at [facility], [address] [place], [place] [address]  Lipase, blood     Status: None   Collection Time: 07/24/18  3:23 PM  Result Value Ref Range   Lipase 22 11 - 51 U/L    Comment: Performed at [facility], [address] [place], [place] [address]  Lactic acid, plasma     Status: None   Collection Time: 07/24/18  3:41 PM  Result Value Ref Range   Lactic Acid, Venous 1.5 0.5 - 1.9 mmol/L    Comment: Performed at [facility], [address] [place], [place] [address]  hCG, quantitative, pregnancy     Status: Abnormal   Collection Time: 07/24/18  3:44 PM  Result Value Ref Range   hCG, Beta Chain, Quant, S 29,525 (H) <5 mIU/mL    Comment:          GEST. AGE      CONC.  (mIU/mL)   <=1 WEEK        5 - 50     2 WEEKS       50 - 500  3 WEEKS       100 - 10,000     4 WEEKS     1,000 - 30,000     5 WEEKS     3,500 - 115,000   6-8 WEEKS     12,000 - 270,000    12 WEEKS     15,000 - 220,000        FEMALE AND NON-PREGNANT FEMALE:     LESS THAN 5 mIU/mL Performed at [facility], [address], [place], [place] [address]   Lactic acid, plasma     Status: None   Collection  Time: 07/24/18  9:50 PM  Result Value Ref Range   Lactic Acid, Venous 1.4 0.5 - 1.9 mmol/L    Comment: Performed at [facility], [address], [place], [place] [address]  Wet prep, genital     Status: Abnormal   Collection Time: 07/24/18 10:40 PM  Result Value Ref Range   Yeast Wet Prep HPF POC NONE SEEN NONE SEEN   Trich, Wet Prep NONE SEEN NONE SEEN   Clue Cells Wet Prep HPF POC NONE SEEN NONE SEEN   WBC, Wet Prep HPF POC MANY (A) NONE SEEN   Sperm NONE SEEN     Comment: Performed at [facility], [address], [place], [place] [address]     Ct Abdomen Pelvis Wo Contrast  Result Date: 07/24/2018 CLINICAL DATA:  Upper mid abdominal pain worsening over the last 5 days. Patient with known intrauterine gestation. Consent for CT scan obtained by referring physician. EXAM: CT ABDOMEN AND PELVIS WITHOUT CONTRAST TECHNIQUE: Multidetector CT imaging of the abdomen and pelvis was performed following the standard protocol without IV contrast. COMPARISON:  Abdominal ultrasound 07/23/2018.  CT scan 08/12/2017. FINDINGS: Lower chest: Unremarkable. Hepatobiliary: 6 mm low-density lesion in the dome of liver is similar to prior. The liver shows diffusely decreased attenuation suggesting steatosis. Gallbladder surgically absent. No intrahepatic or extrahepatic biliary dilation. Pancreas: No focal mass lesion. No dilatation of the main duct. No intraparenchymal cyst. No peripancreatic edema. Spleen: No splenomegaly. No focal mass lesion. Adrenals/Urinary Tract: No adrenal nodule or mass. Kidneys unremarkable on this noncontrast study. No evidence for hydroureter. The urinary bladder appears normal for the degree of distention. Stomach/Bowel: Stomach is unremarkable. No gastric wall thickening. No evidence of outlet obstruction. Duodenum is normally positioned as is the ligament of Treitz. No small bowel wall thickening. No small bowel dilatation. The terminal ileum is normal. The appendix is  normal. No gross colonic mass. No colonic wall thickening. Vascular/Lymphatic: No abdominal aortic aneurysm. No abdominal aortic atherosclerotic calcification. There is no gastrohepatic or hepatoduodenal ligament lymphadenopathy. No intraperitoneal or retroperitoneal lymphadenopathy. No pelvic sidewall lymphadenopathy. Reproductive: Focal fluid collection in the endometrium compatible with tiny gestational sac period. There is no adnexal mass. Other: No intraperitoneal free fluid. Musculoskeletal: No worrisome lytic or sclerotic osseous abnormality. IMPRESSION: 1. No acute findings in the abdomen or pelvis. Specifically, no findings to explain the patient's history of mid abdominal pain with nausea and vomiting. 2. Hepatic steatosis. Note: I was not contacted about or aware of this case prior to the patient being scanned. Electronically Signed   By: Kennith Center M.D.   On: 07/24/2018 20:04   US Abdomen Limited Ruq  Result Date: 07/23/2018 CLINICAL DATA:  23 year old female with a 5 day history of abdominal pain. EXAM: ULTRASOUND ABDOMEN LIMITED RIGHT UPPER QUADRANT COMPARISON:  Early obstetrical ultrasound performed yesterday. Prior CT scan of  the abdomen and pelvis 08/12/2017 FINDINGS: Gallbladder: Surgically absent. Common bile duct: Diameter: Mildly dilated at 9 mm Liver: No focal lesion identified. Within normal limits in parenchymal echogenicity. Portal vein is patent on color Doppler imaging with normal direction of blood flow towards the liver. IVC:  No abnormality visualized. Pancreas:   Visualized portion unremarkable. Right Kidney: Length: 9.6 cm Echogenicity within normal limits. No mass or hydronephrosis visualized. Abdominal aorta:  No aneurysm visualized. Other findings: None. IMPRESSION: 1. No significant interval change in the degree of mild dilation of the common bile duct measuring up to 9 mm compared to prior CT imaging from 08/12/2017. 2. Surgical changes of prior cholecystectomy.  Electronically Signed   By: Malachy Moan M.D.   On: 07/23/2018 12:19     Levie Heritage, DO 07/25/2018, 1:13 AM Attending Physician Faculty Practice, [facility]

## 2018-07-25 NOTE — H&P (Signed)
History and Physical    Angela Flynn TWK:[id number] DOB: 1996-02-10 DOA: 07/24/2018  PCP: Galen Manila, NP  Patient coming from: Home  I have personally briefly reviewed patient's old medical records in [facility]  Chief Complaint: Abd pain, N/V  HPI: Angela Flynn is a 23 y.o. female with medical history significant of PID, Lap chole.  Patient is 5-[redacted] weeks pregnant.  Presents to ED for ongoing abd pain.  Pain is primarily in upper abdomen.  Crampy and colicky.  Ongoing for at least 3 days now.  Evaluated on 4/12 and 4/13 (twice) in ED at [facility]: confirmation of intrauterine products of conception, questionable viability on 07-22-2018.  No cardiac activity was noted.  Beta HCG on initial evaluation was 22k.  Patient also had a right upper quadrant ultrasound yesterday which showed chronic CBD dilatation but no acute findings. Denies significant vaginal discharge. Reports 2 female sexual partners in the last 6 months and denies concern about STI.  Denies fever or chills.  Does report onset of diarrhea 3 days ago, she she was having "a little bit" before they started her on laxatives in the ED for possible constipation.  She does have recent exposure to individual with C.Diff, but she has not been on any ABx recently until in ED today.  Reports N/V and abd pain is severe, unable to eat anything for past 3 days.   ED Course: 16 keytones in urine.  B HCG is now 29k.  LFTs nl, lipase nl, WBC 12.4k.  HGB 14.1 (was 14.1 and 14.3 the past 2 days).  Pelvic exam revealed fair amount of white discharge, no blood, some cervical motion tenderness.  EDP spoke with OB/GYN:  Pt got 1 dose of rocephin / azithro for possible PID though this felt unlikely.  CT abd/pelvis W contrast was obtained on the recs of OB: no acute findings or abnormalities.  Symptoms have persisted, Medicine asked to admit and OB consulted at this point.   Review of Systems: As per HPI otherwise  10 point review of systems negative.   Past Medical History:  Diagnosis Date  . Allergy    seasonal, dusts  . Herpes genitalis   . MRSA infection    abscesses - history of    Past Surgical History:  Procedure Laterality Date  . CHOLECYSTECTOMY, LAPAROSCOPIC    . TONSILLECTOMY       reports that she has been smoking cigars. She has never used smokeless tobacco. She reports current drug use. Drug: Marijuana. She reports that she does not drink alcohol.  No Known Allergies  Family History  Problem Relation Age of Onset  . Bipolar disorder Mother   . Heart disease Sister   . Diabetes Maternal Grandmother      Prior to Admission medications   Medication Sig Start Date End Date Taking? Authorizing Provider  acetaminophen (TYLENOL) 500 MG tablet Take 500 mg by mouth every 6 (six) hours as needed for mild pain or headache.    Yes [provider]  ondansetron (ZOFRAN ODT) 4 MG disintegrating tablet Take 1 tablet (4 mg total) by mouth every 8 (eight) hours as needed for nausea or vomiting. 07/23/18  Yes Emily Filbert, MD  polyethylene glycol (MIRALAX / GLYCOLAX) 17 g packet Take 17 g by mouth daily. 07/23/18  Yes Emily Filbert, MD    Physical Exam: Vitals:   07/24/18 1700 07/24/18 1715 07/24/18 1730 07/24/18 1745  BP: 133/62 124/65 135/64 132/64  Pulse: 83  83 85 70  Resp:      Temp:      TempSrc:      SpO2: 99% 97% 100% 97%  Weight:        Constitutional: NAD, calm, comfortable Eyes: PERRL, lids and conjunctivae normal ENMT: Mucous membranes are moist. Posterior pharynx clear of any exudate or lesions.Normal dentition.  Neck: normal, supple, no masses, no thyromegaly Respiratory: clear to auscultation bilaterally, no wheezing, no crackles. Normal respiratory effort. No accessory muscle use.  Cardiovascular: Regular rate and rhythm, no murmurs / rubs / gallops. No extremity edema. 2+ pedal pulses. No carotid bruits.  Abdomen: no tenderness, no masses  palpated. No hepatosplenomegaly. Bowel sounds positive.  Musculoskeletal: no clubbing / cyanosis. No joint deformity upper and lower extremities. Good ROM, no contractures. Normal muscle tone.  Skin: no rashes, lesions, ulcers. No induration Neurologic: CN 2-12 grossly intact. Sensation intact, DTR normal. Strength 5/5 in all 4.  Psychiatric: Normal judgment and insight. Alert and oriented x 3. Normal mood.    Labs on Admission: I have personally reviewed following labs and imaging studies  CBC: Recent Labs  Lab 07/22/18 1109 07/23/18 1027 07/24/18 1523  WBC 8.2 11.5* 12.4*  NEUTROABS 5.2 8.6* 9.2*  HGB 14.1 14.3 14.1  HCT 41.7 42.8 43.6  MCV 89.5 88.8 90.6  PLT 308 358 348   Basic Metabolic Panel: Recent Labs  Lab 07/22/18 1109 07/23/18 1027 07/24/18 1523  NA 136 134* 135  K 4.4 3.5 3.9  CL 102 106 106  CO2 23 19* 19*  GLUCOSE 106* 112* 92  BUN CREATININE 0.41* 0.47 0.56  CALCIUM 9.1 8.9 8.9   GFR: Estimated Creatinine Clearance: 150 mL/min (by C-G formula based on SCr of 0.56 mg/dL). Liver Function Tests: Recent Labs  Lab 07/22/18 1109 07/23/18 1027 07/24/18 1523  AST ALT ALKPHOS 75 74 78  BILITOT 1.1 1.1 1.0  PROT 7.8 7.9 7.3  ALBUMIN 4.3 4.2 4.1   Recent Labs  Lab 07/22/18 1109 07/23/18 1027 07/24/18 1523  LIPASE No results for input(s): AMMONIA in the last 168 hours. Coagulation Profile: No results for input(s): INR, PROTIME in the last 168 hours. Cardiac Enzymes: No results for input(s): CKTOTAL, CKMB, CKMBINDEX, TROPONINI in the last 168 hours. BNP (last 3 results) No results for input(s): PROBNP in the last 8760 hours. HbA1C: No results for input(s): HGBA1C in the last 72 hours. CBG: No results for input(s): GLUCAP in the last 168 hours. Lipid Profile: No results for input(s): CHOL, HDL, LDLCALC, TRIG, CHOLHDL, LDLDIRECT in the last 72 hours. Thyroid Function Tests: No results for input(s): TSH,  T4TOTAL, FREET4, T3FREE, THYROIDAB in the last 72 hours. Anemia Panel: No results for input(s): VITAMINB12, FOLATE, FERRITIN, TIBC, IRON, RETICCTPCT in the last 72 hours. Urine analysis:    Component Value Date/Time   COLORURINE YELLOW 07/24/2018 1523   APPEARANCEUR HAZY (A) 07/24/2018 1523   LABSPEC 1.024 07/24/2018 1523   PHURINE 5.0 07/24/2018 1523   GLUCOSEU NEGATIVE 07/24/2018 1523   HGBUR NEGATIVE 07/24/2018 1523   BILIRUBINUR NEGATIVE 07/24/2018 1523   KETONESUR 80 (A) 07/24/2018 1523   PROTEINUR NEGATIVE 07/24/2018 1523   NITRITE NEGATIVE 07/24/2018 1523   LEUKOCYTESUR NEGATIVE 07/24/2018 1523    Radiological Exams on Admission: Ct Abdomen Pelvis Wo Contrast  Result Date: 07/24/2018 CLINICAL DATA:  Upper mid abdominal pain worsening over the last 5 days. Patient with known intrauterine gestation.  Consent for CT scan obtained by referring physician. EXAM: CT ABDOMEN AND PELVIS WITHOUT CONTRAST TECHNIQUE: Multidetector CT imaging of the abdomen and pelvis was performed following the standard protocol without IV contrast. COMPARISON:  Abdominal ultrasound 07/23/2018.  CT scan 08/12/2017. FINDINGS: Lower chest: Unremarkable. Hepatobiliary: 6 mm low-density lesion in the dome of liver is similar to prior. The liver shows diffusely decreased attenuation suggesting steatosis. Gallbladder surgically absent. No intrahepatic or extrahepatic biliary dilation. Pancreas: No focal mass lesion. No dilatation of the main duct. No intraparenchymal cyst. No peripancreatic edema. Spleen: No splenomegaly. No focal mass lesion. Adrenals/Urinary Tract: No adrenal nodule or mass. Kidneys unremarkable on this noncontrast study. No evidence for hydroureter. The urinary bladder appears normal for the degree of distention. Stomach/Bowel: Stomach is unremarkable. No gastric wall thickening. No evidence of outlet obstruction. Duodenum is normally positioned as is the ligament of Treitz. No small bowel wall  thickening. No small bowel dilatation. The terminal ileum is normal. The appendix is normal. No gross colonic mass. No colonic wall thickening. Vascular/Lymphatic: No abdominal aortic aneurysm. No abdominal aortic atherosclerotic calcification. There is no gastrohepatic or hepatoduodenal ligament lymphadenopathy. No intraperitoneal or retroperitoneal lymphadenopathy. No pelvic sidewall lymphadenopathy. Reproductive: Focal fluid collection in the endometrium compatible with tiny gestational sac period. There is no adnexal mass. Other: No intraperitoneal free fluid. Musculoskeletal: No worrisome lytic or sclerotic osseous abnormality. IMPRESSION: 1. No acute findings in the abdomen or pelvis. Specifically, no findings to explain the patient's history of mid abdominal pain with nausea and vomiting. 2. Hepatic steatosis. Note: I was not contacted about or aware of this case prior to the patient being scanned. Electronically Signed   By: Kennith Center M.D.   On: 07/24/2018 20:04   US Abdomen Limited Ruq  Result Date: 07/23/2018 CLINICAL DATA:  23 year old female with a 5 day history of abdominal pain. EXAM: ULTRASOUND ABDOMEN LIMITED RIGHT UPPER QUADRANT COMPARISON:  Early obstetrical ultrasound performed yesterday. Prior CT scan of the abdomen and pelvis 08/12/2017 FINDINGS: Gallbladder: Surgically absent. Common bile duct: Diameter: Mildly dilated at 9 mm Liver: No focal lesion identified. Within normal limits in parenchymal echogenicity. Portal vein is patent on color Doppler imaging with normal direction of blood flow towards the liver. IVC:  No abnormality visualized. Pancreas:   Visualized portion unremarkable. Right Kidney: Length: 9.6 cm Echogenicity within normal limits. No mass or hydronephrosis visualized. Abdominal aorta:  No aneurysm visualized. Other findings: None. IMPRESSION: 1. No significant interval change in the degree of mild dilation of the common bile duct measuring up to 9 mm compared to prior  CT imaging from 08/12/2017. 2. Surgical changes of prior cholecystectomy. Electronically Signed   By: Malachy Moan M.D.   On: 07/23/2018 12:19    EKG: Independently reviewed.  Assessment/Plan Active Problems:   Epigastric abdominal pain affecting pregnancy in first trimester    1. Epigastric abd pain affecting pregnancy, first trimester - DDx and discussion: 1. Ectopic pregnancy 1. Less likely given neg [place], negative CT 2. Will re-obtain pelvic US however to see if any change can be seen 2. Miscarriage - 1. Dr. Adrian Blackwater DOES expect that given the US findings x3 days ago combined with the inappropriate rise in b-HCG that this represents a "failed pregnancy". 2. However, no vaginal bleeding 3. And this doesn't explain her epigastric pain he feels 4. OB seeing in formal consult 3. Negative CT, especially after 3 days of symptoms does rule out a number of possibilities: Would have expected to see SBO, aneurysm /  rupture, wandering spleen syndrome, appendicitis, perforated gastric ulcer, adrenal hemorrhage, spontaneous hemoperitoneum, mesenteric vein thrombosis (with subsequent bowel edema), spontaneous rupture of urinary tract, diverticulitis, or abscesses on CT scan 4. No pancreatitis on CT, lipase normal 5. C.Diff seems unlikely: no WBC, no fever, no colitis on CT scan, and no recent ABx to precipitate this. 6. Gastroenteritis / GERD is a possibility. 7. PID is a possibility, did get rocephin / azithro x1 dose in ED, results of pelvic pending still.  But typically causes pelvic (not upper abd pain), and these are uncommon in pregnancy according to Dr. Adrian BlackwaterStinson. 1. Dr. Adrian BlackwaterStinson notes that Fitz-Hugh-Curtis would be quite rare 8. Cannabis hyperemesis syndrome is a possibility, though she has never had this before despite extensive THC use previously. 9. Mekel's diverticulum and Porphyria, while possible, would be very rare to say the least. 2. Plan: 1. Repeat CBC/BMP in AM 2. Pain ctrl with  dilaudid (avoid NSAIDs) 3. Phenergan PRN nausea 4. IVF: D5 LR at 125 cc/hr 5. Bentyl x1 6. protonix IV BID 7. OB will follow  DVT prophylaxis: Lovenox - okayed by Dr. Adrian BlackwaterStinson Code Status: Full Family Communication: No family in room Disposition Plan: Home after admit Consults called: Dr. Adrian BlackwaterStinson Admission status: Place in obs   Kashmir Lysaght, [place]AR[place] M. DO [facility]  How to contact the [facility] Attending or Consulting provider 7A - 7P or covering provider during after hours 7P -7A, for this patient?  1. Check the care team in [facility] and look for a) attending/consulting TRH provider listed and b) the [facility] team listed 2. Log into [email or web address]  Amion Physician Scheduling and messaging for groups and whole hospitals  On call and physician scheduling software for group practices, residents, hospitalists and other medical providers for call, clinic, rotation and shift schedules. OnCall Enterprise is a hospital-wide system for scheduling doctors and paging doctors on call. EasyPlot is for scientific plotting and data analysis.  [email or web address]  and use [place] universal password to access. If you do not have the password, please contact the hospital operator.  3. Locate the [facility] provider you are looking for under [facility] and page to a number that you can be directly reached. 4. If you still have difficulty reaching the provider, please page the [facility] (Director on Call) for the Hospitalists listed on amion for assistance.  07/25/2018, 12:58 AM

## 2018-07-25 NOTE — TOC Initial Note (Signed)
Transition of Care [facility]) - Initial/Assessment Note    Patient Details  Name: Angela Flynn MRN: [id number] Date of Birth: [birth year]  [facility] CM/SW Contact:    Bess Kinds, RN Phone Number: [phone] 07/25/2018, 4:01 PM  Clinical Narrative:                 Spoke with patient at bedside. States that she had found out she was pregnant a couple days prior to admission. States that she normally lives with her boyfriend, however, she has been staying with her mom while sick. Reports having a job at [employer], but no insurance. Discussed contacting DSS to apply for family Medicaid. Patient verbalized understanding. Referral placed with [facility] - [address] - they will call her to schedule an appointment when she is 10-12 weeks, which is typical time for first appointment. Hospital f/u appointment scheduled at [facility] for 4/30 9:30am. Patient reports having transportation to get to appointments. No other transition of care needs identified at this time.   Expected Discharge Plan: Home/Self Care Barriers to Discharge: Continued Medical Work up   Patient Goals and CMS Choice     Choice offered to / list presented to : NA  Expected Discharge Plan and Services Expected Discharge Plan: Home/Self Care In-house Referral: Financial Counselor Discharge Planning Services: CM Consult, Follow-up appt scheduled Post Acute Care Choice: NA                   DME Arranged: N/A DME Agency: NA HH Arranged: NA HH Agency: NA  Prior Living Arrangements/Services   Lives with:: Self, Significant Other   Do you feel safe going back to the place where you live?: Yes      Need for Family Participation in Patient Care: Yes (Comment) Care giver support system in place?: Yes (comment)      Activities of Daily Living Home Assistive Devices/Equipment: None ADL Screening (condition at time of admission) Patient's cognitive ability adequate  to safely complete daily activities?: Yes Is the patient deaf or have difficulty hearing?: No Does the patient have difficulty seeing, even when wearing glasses/contacts?: No Does the patient have difficulty concentrating, remembering, or making decisions?: No Patient able to express need for assistance with ADLs?: Yes Does the patient have difficulty dressing or bathing?: No Independently performs ADLs?: Yes (appropriate for developmental age) Does the patient have difficulty walking or climbing stairs?: No Weakness of Legs: None Weakness of Arms/Hands: None  Permission Sought/Granted                  Emotional Assessment Appearance:: Appears stated age Attitude/Demeanor/Rapport: Engaged Affect (typically observed): Accepting Orientation: : Oriented to Self, Oriented to  Time, Oriented to Place, Oriented to Situation   Psych Involvement: No (comment)  Admission diagnosis:  Generalized abdominal pain [R10.84] Abdominal pain [R10.9] Nausea and vomiting, intractability of vomiting not specified, unspecified vomiting type [R11.2] Patient Active Problem List   Diagnosis Date Noted  . Epigastric abdominal pain affecting pregnancy in first trimester 07/25/2018  . Pregnancy with uncertain fetal viability 07/25/2018  . Herpes genitalis 02/21/2018  . Gonorrhea 03/29/2015  . Pelvic inflammatory disease 03/28/2015  . SIRS (systemic inflammatory response syndrome) (HCC) 03/28/2015  . Leukocytosis 03/28/2015  . Hypokalemia 03/28/2015   PCP:  Galen Manila, NP Pharmacy:   [facility] [address] [place] - [address] AT [facility] [address] [place] [address] Phone:  [phone] Fax: [phone]     Social Determinants of Health (SDOH) Interventions    Readmission Risk Interventions No flowsheet data found.

## 2018-07-26 DIAGNOSIS — R1084 Generalized abdominal pain: Secondary | ICD-10-CM

## 2018-07-26 DIAGNOSIS — R112 Nausea with vomiting, unspecified: Secondary | ICD-10-CM | POA: Diagnosis present

## 2018-07-26 MED ORDER — CAMPHOR-MENTHOL 0.5-0.5 % EX LOTN
TOPICAL_LOTION | CUTANEOUS | Status: DC | PRN
  Filled 2018-07-26: qty 222

## 2018-07-26 MED ORDER — DICYCLOMINE HCL 10 MG PO CAPS
10.0000 mg | ORAL_CAPSULE | Freq: Three times a day (TID) | ORAL | Status: DC
  Administered 2018-07-26 – 2018-07-28 (×3): 10 mg via ORAL
  Filled 2018-07-26 (×5): qty 1

## 2018-07-26 MED ORDER — DEXTROSE IN LACTATED RINGERS 5 % IV SOLN: INTRAVENOUS | Status: AC

## 2018-07-26 MED ORDER — WHITE PETROLATUM EX OINT
TOPICAL_OINTMENT | CUTANEOUS | Status: AC
  Administered 2018-07-27: 0.2
  Filled 2018-07-26: qty 28.35

## 2018-07-26 MED ORDER — HYDROMORPHONE HCL 1 MG/ML IJ SOLN
0.5000 mg | INTRAMUSCULAR | Status: DC | PRN
  Administered 2018-07-26 – 2018-07-27 (×7): 1 mg via INTRAVENOUS
  Filled 2018-07-26 (×7): qty 1

## 2018-07-26 MED ORDER — METOCLOPRAMIDE HCL 5 MG/ML IJ SOLN
5.0000 mg | Freq: Three times a day (TID) | INTRAMUSCULAR | Status: AC
  Administered 2018-07-26 – 2018-07-27 (×3): 5 mg via INTRAVENOUS
  Filled 2018-07-26 (×3): qty 2

## 2018-07-26 MED ORDER — PRENATAL MULTIVITAMIN CH
1.0000 | ORAL_TABLET | Freq: Every day | ORAL | Status: DC
  Filled 2018-07-26 (×3): qty 1

## 2018-07-26 NOTE — Progress Notes (Signed)
[facility] PROGRESS NOTE  Angela Flynn ZOX:[id number] DOB: [birth year] DOA: 07/24/2018 PCP: Galen ManilaKennedy, Lauren Renee, NP  Assessment/Plan:  1. Epigastric abd pain affecting pregnancy, first trimester etiology unclear. Evaluated by ob/gyn who opine CT scan has ruled out most insidious causes. Lipase within limits of normal. CT neg pancreatitis for free air. ?gastroenteritis. She is afebrile and non-toxic appearing. No leukocytosos. US pending Continues with pain today. Patient reports the nausea/vomiting is due to uncontrolled pain. She reports when her pain is controlled, she is able to tolerate diet. Dr Adrian Blackwaterstinson recommended controlling pain with Bentyl. Ordered prn and has not had any since yesterday 1. Change bentyl to tid AC 2. Change dilaudid to prn every 3 hours 3. Anti-emetic 4. Advance diet  2. Miscarriage -Per chart, Dr. Adrian BlackwaterStinson DOES expect that given the US findings x3 days ago combined with the inappropriate rise in b-HCG that this represents a "failed pregnancy". No vaginal bleeding and this doesn't explain her epigastric pain he feels 1. Follow US 2. See above  Nausea/vomiting. Patient dry heaving during exam. Reports related to pain.  -advance diet as tolerated -improved pain control. -add reglan -monitor  Code Status: full Family Communication: none present Disposition Plan: home   Consultants:  Dr Adrian Blackwaterstinson ob/gyn  Procedures:  US OB transvagina  Antibiotics:    HPI/Subjective: Admitted with abdominal pain nausea vomiting. Workup and OBGYN consult reveal likely "failed pregnancy". But pain epigastric. Continues with pain/nausea/vomiting. No diarrhe  Objective: Vitals:   07/25/18 2115 07/26/18 0443  BP: (!) 139/51 131/62  Pulse: 67 77  Resp: 18 19  Temp: 98.3 F (36.8 C)   SpO2: 100% 100%    Intake/Output Summary (Last 24 hours) at 07/26/2018 1302 Last data filed at 07/26/2018 1100 Gross per 24 hour  Intake 2416.63 ml  Output 0 ml  Net  2416.63 ml   Filed Weights   07/24/18 1414 07/25/18 2115  Weight: 121.6 kg 121.3 kg    Exam:   General:  Obese alert sitting on side of bed dry heaving  Cardiovascular: rrr no mgr no LE edema  Respiratory: normal effort BS clear bilaterally no wheeze  Abdomen: obese soft +BS no guarding or rebounding  Musculoskeletal: joints without swelling/erythema   Data Reviewed: Basic Metabolic Panel: Recent Labs  Lab 07/22/18 1109 07/23/18 1027 07/24/18 1523 07/25/18 0604  NA 136 134* 135 138  K 4.4 3.5 3.9 3.9  CL 102 106 106 105  CO2 23 19* 19* 25  GLUCOSE 106* 112* 92 109*  BUN 10 10 7  5*  CREATININE 0.41* 0.47 0.56 0.61  CALCIUM 9.1 8.9 8.9 8.9   Liver Function Tests: Recent Labs  Lab 07/22/18 1109 07/23/18 1027 07/24/18 1523  AST 28 22 23   ALT 17 19 17   ALKPHOS 75 74 78  BILITOT 1.1 1.1 1.0  PROT 7.8 7.9 7.3  ALBUMIN 4.3 4.2 4.1   Recent Labs  Lab 07/22/18 1109 07/23/18 1027 07/24/18 1523  LIPASE 23 19 22    No results for input(s): AMMONIA in the last 168 hours. CBC: Recent Labs  Lab 07/22/18 1109 07/23/18 1027 07/24/18 1523 07/25/18 0604  WBC 8.2 11.5* 12.4* 10.6*  NEUTROABS 5.2 8.6* 9.2*  --   HGB 14.1 14.3 14.1 11.9*  HCT 41.7 42.8 43.6 36.5  MCV 89.5 88.8 90.6 90.3  PLT 308 358 348 263   Cardiac Enzymes: No results for input(s): CKTOTAL, CKMB, CKMBINDEX, TROPONINI in the last 168 hours. BNP (last 3 results) No results for input(s): BNP in the  last 8760 hours.  ProBNP (last 3 results) No results for input(s): PROBNP in the last 8760 hours.  CBG: No results for input(s): GLUCAP in the last 168 hours.  Recent Results (from the past 240 hour(s))  Wet prep, genital     Status: Abnormal   Collection Time: 07/24/18 10:40 PM  Result Value Ref Range Status   Yeast Wet Prep HPF POC NONE SEEN NONE SEEN Final   Trich, Wet Prep NONE SEEN NONE SEEN Final   Clue Cells Wet Prep HPF POC NONE SEEN NONE SEEN Final   WBC, Wet Prep HPF POC MANY (A)  NONE SEEN Final   Sperm NONE SEEN  Final    Comment: Performed at [facility], [address], [place], [place] [address]     Studies: Ct Abdomen Pelvis Wo Contrast  Result Date: 07/24/2018 CLINICAL DATA:  Upper mid abdominal pain worsening over the last 5 days. Patient with known intrauterine gestation. Consent for CT scan obtained by referring physician. EXAM: CT ABDOMEN AND PELVIS WITHOUT CONTRAST TECHNIQUE: Multidetector CT imaging of the abdomen and pelvis was performed following the standard protocol without IV contrast. COMPARISON:  Abdominal ultrasound 07/23/2018.  CT scan 08/12/2017. FINDINGS: Lower chest: Unremarkable. Hepatobiliary: 6 mm low-density lesion in the dome of liver is similar to prior. The liver shows diffusely decreased attenuation suggesting steatosis. Gallbladder surgically absent. No intrahepatic or extrahepatic biliary dilation. Pancreas: No focal mass lesion. No dilatation of the main duct. No intraparenchymal cyst. No peripancreatic edema. Spleen: No splenomegaly. No focal mass lesion. Adrenals/Urinary Tract: No adrenal nodule or mass. Kidneys unremarkable on this noncontrast study. No evidence for hydroureter. The urinary bladder appears normal for the degree of distention. Stomach/Bowel: Stomach is unremarkable. No gastric wall thickening. No evidence of outlet obstruction. Duodenum is normally positioned as is the ligament of Treitz. No small bowel wall thickening. No small bowel dilatation. The terminal ileum is normal. The appendix is normal. No gross colonic mass. No colonic wall thickening. Vascular/Lymphatic: No abdominal aortic aneurysm. No abdominal aortic atherosclerotic calcification. There is no gastrohepatic or hepatoduodenal ligament lymphadenopathy. No intraperitoneal or retroperitoneal lymphadenopathy. No pelvic sidewall lymphadenopathy. Reproductive: Focal fluid collection in the endometrium compatible with tiny gestational sac period. There is no  adnexal mass. Other: No intraperitoneal free fluid. Musculoskeletal: No worrisome lytic or sclerotic osseous abnormality. IMPRESSION: 1. No acute findings in the abdomen or pelvis. Specifically, no findings to explain the patient's history of mid abdominal pain with nausea and vomiting. 2. Hepatic steatosis. Note: I was not contacted about or aware of this case prior to the patient being scanned. Electronically Signed   By: Kennith Center M.D.   On: 07/24/2018 20:04    Scheduled Meds: . alum & mag hydroxide-simeth  15 mL Oral Once  . enoxaparin (LOVENOX) injection  40 mg Subcutaneous Daily  . pantoprazole (PROTONIX) IV  40 mg Intravenous Q12H   Continuous Infusions: . dextrose 5% lactated ringers 125 mL/hr at 07/26/18 1240    Principal Problem:   Epigastric abdominal pain affecting pregnancy in first trimester Active Problems:   Pregnancy with uncertain fetal viability   Nausea and vomiting    Time spent: 45 minutes    Gwenyth Bender NP  [facility]  If 7PM-7AM, please contact night-coverage at [email or web address], password [facility] 07/26/2018, 1:02 PM  LOS: 1 day

## 2018-07-26 NOTE — Progress Notes (Addendum)
Notified MD Vann,  Pt c/o pain and pain med is not due until 1320, asked if pt could have another PRN medication to help address pain. Awaiting response  Leonia Reeves, RN    Notified NP Black that pt stated she had small amt of vaginal discharge after urinating. Pt also asked if she could take prenatal vitamins while in the hospital. NP called back and stated that she added the prenatal vitamin but that it could cause nausea pt to determine if she wants to take it.   Leonia Reeves, RN

## 2018-07-27 ENCOUNTER — Inpatient Hospital Stay (HOSPITAL_COMMUNITY): Payer: Medicaid Other

## 2018-07-27 DIAGNOSIS — E876 Hypokalemia: Secondary | ICD-10-CM

## 2018-07-27 DIAGNOSIS — O3680X0 Pregnancy with inconclusive fetal viability, not applicable or unspecified: Secondary | ICD-10-CM

## 2018-07-27 LAB — BASIC METABOLIC PANEL
Anion gap: 11 (ref 5–15)
BUN: <5 mg/dL — ABNORMAL LOW (ref 6–20)
CO2: 22 mmol/L (ref 22–32)
Calcium: 8.6 mg/dL — ABNORMAL LOW (ref 8.9–10.3)
Chloride: 103 mmol/L (ref 98–111)
Creatinine, Ser: 0.63 mg/dL (ref 0.44–1.00)
GFR calc Af Amer: >60 mL/min (ref >60)
GFR calc non Af Amer: >60 mL/min (ref >60)
Glucose, Bld: 89 mg/dL (ref 70–99)
Potassium: 3.3 mmol/L — ABNORMAL LOW (ref 3.5–5.1)
Sodium: 136 mmol/L (ref 135–145)

## 2018-07-27 LAB — MAGNESIUM: Magnesium: 1.6 mg/dL — ABNORMAL LOW (ref 1.7–2.4)

## 2018-07-27 LAB — CBC
HCT: 32.9 % — ABNORMAL LOW (ref 36.0–46.0)
Hemoglobin: 11.1 g/dL — ABNORMAL LOW (ref 12.0–15.0)
MCH: 30.4 pg (ref 26.0–34.0)
MCHC: 33.7 g/dL (ref 30.0–36.0)
MCV: 90.1 fL (ref 80.0–100.0)
Platelets: 242 10*3/uL (ref 150–400)
RBC: 3.65 MIL/uL — ABNORMAL LOW (ref 3.87–5.11)
RDW: 12.3 % (ref 11.5–15.5)
WBC: 7.7 10*3/uL (ref 4.0–10.5)
nRBC: 0 % (ref 0.0–0.2)

## 2018-07-27 LAB — RPR: RPR Ser Ql: NONREACTIVE

## 2018-07-27 MED ORDER — MAGNESIUM SULFATE 2 GM/50ML IV SOLN
2.0000 g | Freq: Once | INTRAVENOUS | Status: AC
  Administered 2018-07-27: 2 g via INTRAVENOUS
  Filled 2018-07-27: qty 50

## 2018-07-27 MED ORDER — POTASSIUM CHLORIDE IN NACL 20-0.9 MEQ/L-% IV SOLN
INTRAVENOUS | Status: AC
  Administered 2018-07-27 – 2018-07-28 (×2): via INTRAVENOUS
  Filled 2018-07-27 (×3): qty 1000

## 2018-07-27 MED ORDER — HYDROMORPHONE HCL 1 MG/ML IJ SOLN
0.5000 mg | INTRAMUSCULAR | Status: DC | PRN
  Administered 2018-07-27 – 2018-07-28 (×7): 0.5 mg via INTRAVENOUS
  Filled 2018-07-27 (×7): qty 1

## 2018-07-27 MED ORDER — BISACODYL 10 MG RE SUPP
10.0000 mg | Freq: Once | RECTAL | Status: DC
  Filled 2018-07-27: qty 1

## 2018-07-27 MED ORDER — CAPSAICIN 0.025 % EX CREA
TOPICAL_CREAM | Freq: Two times a day (BID) | CUTANEOUS | Status: DC
  Administered 2018-07-27 (×2): via TOPICAL
  Filled 2018-07-27: qty 60

## 2018-07-27 MED ORDER — POTASSIUM CHLORIDE CRYS ER 20 MEQ PO TBCR
40.0000 meq | EXTENDED_RELEASE_TABLET | ORAL | Status: DC
  Administered 2018-07-27: 40 meq via ORAL
  Filled 2018-07-27: qty 2

## 2018-07-27 MED ORDER — ONDANSETRON HCL 4 MG/2ML IJ SOLN
4.0000 mg | INTRAMUSCULAR | Status: AC
  Administered 2018-07-27 (×2): 4 mg via INTRAVENOUS
  Filled 2018-07-27 (×2): qty 2

## 2018-07-27 MED ORDER — OXYCODONE-ACETAMINOPHEN 5-325 MG PO TABS
1.0000 | ORAL_TABLET | ORAL | Status: DC | PRN
  Administered 2018-07-27 – 2018-07-28 (×3): 2 via ORAL
  Filled 2018-07-27 (×2): qty 2
  Filled 2018-07-27 (×2): qty 1

## 2018-07-27 MED ORDER — VITAMIN B-6 50 MG PO TABS
50.0000 mg | ORAL_TABLET | Freq: Every day | ORAL | Status: DC
  Administered 2018-07-28: 50 mg via ORAL
  Filled 2018-07-27 (×2): qty 1

## 2018-07-27 MED ORDER — PROMETHAZINE HCL 25 MG/ML IJ SOLN
6.2500 mg | INTRAMUSCULAR | Status: DC | PRN
  Administered 2018-07-27 (×3): 6.25 mg via INTRAVENOUS
  Filled 2018-07-27 (×3): qty 1

## 2018-07-27 NOTE — Progress Notes (Addendum)
[facility] PROGRESS NOTE  Angela Flynn [id number] DOB: 1995-12-03 DOA: 07/24/2018 PCP: Galen ManilaKennedy, Lauren Renee, NP  Assessment/Plan: 1. Epigastric abd pain affecting pregnancy, first trimester etiology unclear. Only somewhat improved this am. Evaluated by ob/gyn who opine CT scan has ruled out most insidious causes. Lipase within limits of normal. CT neg pancreatitis for free air. ?gastroenteritis. She is afebrile and non-toxic appearing. No leukocytosos. Repeat pelvic US with pregnancy 6 weeks and small probable subchorionic hemorrhage. 1. continue bentyl to tid AC 2. decrease dilaudid doe to  prn every 3 hours 3. Oral analgesia every 4 hours 4. Phenergan and reglan 5. Advance diet  2. Miscarriage ?Marland Kitchen. Initial impression failed pregnancy. Repeat US reveals 6 week and 1 day pregnancy. No vaginal bleeding and this still doesn't explain her epigastric 1.  will request OB/GYN consult/follow up 2. See above  Nausea/vomiting. Patient continues with intermittent vomiting but somewhat improved.  -continue scheduled bentyl  -advance diet as tolerated -improved pain control. -add reglan -monitor  Hypokalemia. Likely related to #1.  -will repleat and recheck -obtain mag level   Code Status: full Family Communication: none present Disposition Plan: home hopefully tomorrow   Consultants:  OB/GYN  Procedures:  Transvaginal US  Antibiotics:    HPI/Subjective: Lying in bed awake. Reports only minimal improvement pain and requesting pain med.   Objective: Vitals:   07/26/18 2100 07/27/18 0426  BP: (!) 118/58 118/67  Pulse: 73 87  Resp: 20 (!) 21  Temp: 98.4 F (36.9 C) 98 F (36.7 C)  SpO2: 100% 100%    Intake/Output Summary (Last 24 hours) at 07/27/2018 0953 Last data filed at 07/27/2018 0720 Gross per 24 hour  Intake 1040 ml  Output 100 ml  Net 940 ml   Filed Weights   07/25/18 2115 07/26/18 1844 07/26/18 2100  Weight: 121.3 kg 128.2 kg 127.8 kg     Exam:   General:  Awake alert resting comfortably  Cardiovascular: rrr no mgr no LE edema  Respiratory: normal effort BS clear bilaterally no wheeze  Abdomen: obese +BS mild diffuse tenderness particularly upper quadrants  Musculoskeletal: joints without swelling/erythema MAE   Data Reviewed: Basic Metabolic Panel: Recent Labs  Lab 07/22/18 1109 07/23/18 1027 07/24/18 1523 07/25/18 0604 07/27/18 0714  NA 136 134* 135 138 136  K 4.4 3.5 3.9 3.9 3.3*  CL 102 106 106 105 103  CO2 23 19* 19* 25 22  GLUCOSE 106* 112* 92 109* 89  BUN 10 10 7  5* <5*  CREATININE 0.41* 0.47 0.56 0.61 0.63  CALCIUM 9.1 8.9 8.9 8.9 8.6*   Liver Function Tests: Recent Labs  Lab 07/22/18 1109 07/23/18 1027 07/24/18 1523  AST 28 22 23   ALT 17 19 17   ALKPHOS 75 74 78  BILITOT 1.1 1.1 1.0  PROT 7.8 7.9 7.3  ALBUMIN 4.3 4.2 4.1   Recent Labs  Lab 07/22/18 1109 07/23/18 1027 07/24/18 1523  LIPASE 23 19 22    No results for input(s): AMMONIA in the last 168 hours. CBC: Recent Labs  Lab 07/22/18 1109 07/23/18 1027 07/24/18 1523 07/25/18 0604 07/27/18 0714  WBC 8.2 11.5* 12.4* 10.6* 7.7  NEUTROABS 5.2 8.6* 9.2*  --   --   HGB 14.1 14.3 14.1 11.9* 11.1*  HCT 41.7 42.8 43.6 36.5 32.9*  MCV 89.5 88.8 90.6 90.3 90.1  PLT 308 358 348 263 242   Cardiac Enzymes: No results for input(s): CKTOTAL, CKMB, CKMBINDEX, TROPONINI in the last 168 hours. BNP (last 3 results) No results  for input(s): BNP in the last 8760 hours.  ProBNP (last 3 results) No results for input(s): PROBNP in the last 8760 hours.  CBG: No results for input(s): GLUCAP in the last 168 hours.  Recent Results (from the past 240 hour(s))  Wet prep, genital     Status: Abnormal   Collection Time: 07/24/18 10:40 PM  Result Value Ref Range Status   Yeast Wet Prep HPF POC NONE SEEN NONE SEEN Final   Trich, Wet Prep NONE SEEN NONE SEEN Final   Clue Cells Wet Prep HPF POC NONE SEEN NONE SEEN Final   WBC, Wet Prep  HPF POC MANY (A) NONE SEEN Final   Sperm NONE SEEN  Final    Comment: Performed at [facility], [address] [place], [place] [address]     Studies: US Ob Transvaginal  Result Date: 07/27/2018 CLINICAL DATA:  Abdominal pain. First trimester pregnancy with inconclusive fetal viability. EXAM: TRANSVAGINAL OB ULTRASOUND TECHNIQUE: Transvaginal ultrasound was performed for complete evaluation of the gestation as well as the maternal uterus, adnexal regions, and pelvic cul-de-sac. COMPARISON:  07/22/2018 FINDINGS: Intrauterine gestational sac: Single Yolk sac:  Visualized. Embryo:  Visualized. Cardiac Activity: Visualized. Heart Rate: 105 bpm CRL:   4 mm   6 w 1 d                  [place] EDC: 03/21/2019 Subchorionic hemorrhage: Probable small subchorionic hemorrhage noted. Maternal uterus/adnexae: Normal appearance of both ovaries. Small left ovarian corpus luteum cyst noted. No mass or abnormal free fluid identified. IMPRESSION: Single living IUP measuring 6 weeks 1 day, with [place] EDC of 03/21/2019. Small probable small subchorionic hemorrhage. Electronically Signed   By: Myles Rosenthal M.D.   On: 07/27/2018 08:40    Scheduled Meds: . alum & mag hydroxide-simeth  15 mL Oral Once  . dicyclomine  10 mg Oral TID AC  . enoxaparin (LOVENOX) injection  40 mg Subcutaneous Daily  . pantoprazole (PROTONIX) IV  40 mg Intravenous Q12H  . potassium chloride  40 mEq Oral Q4H  . prenatal multivitamin  1 tablet Oral Q1200   Continuous Infusions:  Principal Problem:   Epigastric abdominal pain affecting pregnancy in first trimester Active Problems:   Pregnancy with uncertain fetal viability   Nausea and vomiting   Generalized abdominal pain   Hypokalemia    Time spent: 45 minutes    [facility] M NP  [facility]  If 7PM-7AM, please contact night-coverage at [email or web address], password [facility] 07/27/2018, 9:53 AM  LOS: 2 days

## 2018-07-27 NOTE — Progress Notes (Addendum)
1020- Notified NP Black that after pt received PO K she began to vomit. Gave IV Phenergren earlier and it is not time for next dose, pt does not have another PRN medication for nausea.      1300 Notified NP Black via amion that Mg=1.6.  Angela Reeves, RN

## 2018-07-27 NOTE — Progress Notes (Signed)
Patient ID: Angela Flynn, female   DOB: 04/17/1995, 23 y.o.   MRN: [id number] OB Consult  Pt sitting up on side of bed with epigastric pain and N/V. Pt continues with pain and has N/V with the pain. She denies any vaginal bleeding or discharge  PE AF VSS Lungs clear Heart RRR Abd soft + BS epigastric tenderness no rebound  GU deferred Ext non tender  U/S: IUP 6 weeks, + cardiac activity, small [facility]  A/P Early IUP 6 weeks        Epigastric pain        N/V related to # 2  Pt reports pain start in Jan prior to pregnancy and has progressed since then. Pain is colicky in nature. N/V happens with the pain. Unable to associate pt's current Sx with this early gestation. She denies cannabis  use for over 1 month, thus do not think this has any relationship either.  Agree with PPI, antiemetics and IV fluids. Would recommend GI consult for evaluation. Will continue to follow with you while pt in hospital.  Pt informed of OB U/S results and will assist pt in coordinating prenatal care at time of discharge. She was very happy with pregnancy news.   Call [phone] for any questions. Please do not share this phone # with pt's or family members.

## 2018-07-27 NOTE — Progress Notes (Signed)
At 10: 20 received notification from rn that patient unable to tolerate po potassium supplement. She vomited and her prn phenergan was not due and no other prn anti-emetic available. I immediately changed the dose and frequency of phenergan making it available at that time for patient. zofran being avoided if possible per OB/gyn recommendations.    At 1300 notified for mag level 1.6. order for mag supplement placed    Clydie Braun m. Shera Laubach, np

## 2018-07-28 DIAGNOSIS — Z3491 Encounter for supervision of normal pregnancy, unspecified, first trimester: Secondary | ICD-10-CM

## 2018-07-28 DIAGNOSIS — O468X1 Other antepartum hemorrhage, first trimester: Secondary | ICD-10-CM

## 2018-07-28 DIAGNOSIS — O418X10 Other specified disorders of amniotic fluid and membranes, first trimester, not applicable or unspecified: Secondary | ICD-10-CM

## 2018-07-28 LAB — BASIC METABOLIC PANEL
Anion gap: 10 (ref 5–15)
BUN: <5 mg/dL — ABNORMAL LOW (ref 6–20)
CO2: 20 mmol/L — ABNORMAL LOW (ref 22–32)
Calcium: 8.6 mg/dL — ABNORMAL LOW (ref 8.9–10.3)
Chloride: 106 mmol/L (ref 98–111)
Creatinine, Ser: 0.5 mg/dL (ref 0.44–1.00)
GFR calc Af Amer: >60 mL/min (ref >60)
GFR calc non Af Amer: >60 mL/min (ref >60)
Glucose, Bld: 93 mg/dL (ref 70–99)
Potassium: 3.7 mmol/L (ref 3.5–5.1)
Sodium: 136 mmol/L (ref 135–145)

## 2018-07-28 LAB — MAGNESIUM: Magnesium: 1.9 mg/dL (ref 1.7–2.4)

## 2018-07-28 MED ORDER — PRENATAL MULTIVITAMIN CH: 1.0000 | ORAL_TABLET | Freq: Every day | ORAL | 0 refills | Status: DC

## 2018-07-28 MED ORDER — PROMETHAZINE HCL 12.5 MG PO TABS: 12.5000 mg | ORAL_TABLET | Freq: Four times a day (QID) | ORAL | 0 refills | Status: DC | PRN

## 2018-07-28 MED ORDER — PANTOPRAZOLE SODIUM 40 MG PO TBEC
40.0000 mg | DELAYED_RELEASE_TABLET | Freq: Two times a day (BID) | ORAL | Status: DC
  Administered 2018-07-28: 40 mg via ORAL
  Filled 2018-07-28: qty 1

## 2018-07-28 MED ORDER — OXYCODONE-ACETAMINOPHEN 5-325 MG PO TABS: 1.0000 | ORAL_TABLET | ORAL | 0 refills | Status: DC | PRN

## 2018-07-28 MED ORDER — DOCUSATE SODIUM 100 MG PO CAPS: 100.0000 mg | ORAL_CAPSULE | Freq: Every day | ORAL | 0 refills | Status: DC | PRN

## 2018-07-28 MED ORDER — PANTOPRAZOLE SODIUM 40 MG PO TBEC: 40.0000 mg | DELAYED_RELEASE_TABLET | Freq: Every day | ORAL | 0 refills | Status: DC

## 2018-07-28 NOTE — Discharge Summary (Addendum)
Angela Flynn, is a 23 y.o. female  DOB 17-May-1995  MRN [id number].  Admission date:  07/24/2018  Admitting Physician  Hillary Bow, DO  Discharge Date:  07/28/2018   Primary MD  Galen Manila, NP  Recommendations for primary care physician for things to follow:   Follow-up with OB/GYN  Discharge Diagnosis    Principal Problem:   Epigastric abdominal pain affecting pregnancy in first trimester Active Problems:   Hypokalemia   Pregnancy with uncertain fetal viability   Nausea and vomiting   Generalized abdominal pain   Morbid obesity (HCC)   Subchorionic hemorrhage in first trimester      Past Medical History:  Diagnosis Date   Allergy    seasonal, dusts   Herpes genitalis    MRSA infection    abscesses - history of    Past Surgical History:  Procedure Laterality Date   CHOLECYSTECTOMY, LAPAROSCOPIC     TONSILLECTOMY         HPI  from the history and physical done on the day of admission:   Angela Flynn is a 23 y.o. female with medical history significant of PID, Lap chole.  Patient is 5-[redacted] weeks pregnant.  Presents to ED for ongoing abd pain.  Pain is primarily in upper abdomen.  Crampy and colicky.  Ongoing for at least 3 days now.  Evaluated on 4/12 and 4/13 (twice) in ED at [facility]: confirmation of intrauterine products of conception, questionable viability on 07-22-2018. No cardiac activity was noted.  Beta HCG on initial evaluation was 22k.  Patient also had a right upper quadrant ultrasound yesterday which showed chronic CBD dilatation but no acute findings. Denies significant vaginal discharge. Reports 2 female sexual partners in the last 6 months and denies concern about STI.  Denies fever or chills.  Does report onset of diarrhea 3 days ago, she she was having "a  little bit" before they started her on laxatives in the ED for possible constipation.  She does have recent exposure to individual with C.Diff, but she has not been on any ABx recently until in ED today.  Reports N/V and abd pain is severe, unable to eat anything for past 3 days.   ED Course: 76 keytones in urine.  B HCG is now 29k.  LFTs nl, lipase nl, WBC 12.4k.  HGB 14.1 (was 14.1 and 14.3 the past 2 days).  Pelvic exam revealed fair amount of white discharge, no blood, some cervical motion tenderness.  EDP spoke with OB/GYN:  Pt got 1 dose of rocephin / azithro for possible PID though this felt unlikely.  CT abd/pelvis W contrast was obtained on the recs of OB: no acute findings or abnormalities.  Symptoms have persisted, Medicine asked to admit and OB consulted at this point.   Hospital Course:      1. Epigastric abd pain affecting pregnancy, subchorionic hemorrhage in first trimester pregnancy: Patient reports improvewith pain medications.  Evaluated by ob/gyn who suggested obtaining CT scan to ruled out most insidious  causes. Lipase within limits of normal. CT was negative pancreatitis, obstruction, or free air.  Question possibly gastroenteritis. She remained afebrile, without leukocytosis, and non-toxic appearing. Repeat pelvic US with pregnancy 6 weeks and 1 day with small probable subchorionic hemorrhage.  Subchorionic hemorrhages noted to have possible correlation with failed pregnancy.  Pregnancy not thought to be cause of symptoms.  Patient was continued on prenatal vitamins and Protonix at discharge.  OB/GYN was consulted and recommended outpatient follow-up at [facility] outpatient clinic. 2. Ruled out miscarriage: OB/GYN's initial impression was a failed pregnancy.  However, repeat transvaginal US viable pregnancy.  She did not report anyvaginal bleeding. 3. Nausea/vomiting: Improved.  Managed symptomatically during hospitalization with antiemetics and IV fluid.  At  discharge patient was noted to tolerate adequate p.o. intake. 4. Hypokalemia/hypomagnesemia: Replaced.  Likely related to #1.  5. Normocytic normochromic anemia: Hemoglobin stable around 11 g/dL.  No reports of bleeding. 6. Morbid obesity: BMI 43.3 kg/m   Follow UP  Follow-up Information    [facility] Follow up.   Why:  call to have appointment made let them know you were in the hospital found to be [redacted] wks pregnant and need new ob/gyn.   Contact information: [address] [place] [address] [phone]           Consults obtained:OB/GYN  Discharge Condition: Stable  Diet and Activity recommendation: See Discharge Instructions below   Discharge Instructions    Discharge instructions   Complete by:  As directed    Please call women's health at the number provided to set up a follow-up OB/GYN appointment.  Ultrasound revealed that you are approximately [redacted] weeks pregnant.   ( we routinely change or add medications that can affect your baseline labs and fluid status, therefore we recommend that you get the mentioned basic workup next visit with your PCP, your PCP may decide not to get them or add new tests based on their clinical decision)  Activity: As tolerated  Disposition: Home   Diet: Resume previous diet   Special Instructions: If you have smoked or chewed Tobacco  in the last 2 yrs please stop smoking, stop any regular Alcohol  and or any Recreational drug use.  On your next visit with your primary care physician please Get Medicines reviewed and adjusted.  Please request your Galen Manila, NP to go over all Hospital Tests and Procedure/Radiological results at the follow up, please get all Hospital records sent to your Prim MD by signing hospital release before you go home.  If you experience worsening of your admission symptoms, develop shortness of breath, life threatening emergency, suicidal or  homicidal thoughts you must seek medical attention immediately by calling 911 or calling your MD immediately  if symptoms less severe.  You Must read complete instructions/literature along with all the possible adverse reactions/side effects for all the Medicines you take and that have been prescribed to you. Take any new Medicines after you have completely understood and accpet all the possible adverse reactions/side effects.   Do not drive, operate heavy machinery, perform activities at heights, swimming or participation in water activities or provide baby sitting services if your were admitted for syncope or siezures until you have seen by Primary MD or a Neurologist and advised to do so again.  Do not drive when taking Pain medications.  Do not take more than prescribed Pain, Sleep and Anxiety Medications  Wear Seat belts while driving.   Please note  You were cared for by a hospitalist during your hospital stay. If you have any questions about your discharge medications or the care you received while you were in the hospital after you are discharged, you can call the unit and asked to speak with the hospitalist on call if the hospitalist that took care of you is not available. Once you are discharged, your primary care physician will handle any further medical issues. Please note that NO REFILLS for any discharge medications will be authorized once you are discharged, as it is imperative that you return to your primary care physician (or establish a relationship with a primary care physician if you do not have one) for your aftercare needs so that they can reassess your need for medications and monitor your lab values.   Increase activity slowly   Complete by:  As directed         Discharge Medications     Allergies as of 07/28/2018   No Known Allergies     Medication List    STOP taking these medications   ipratropium 0.06 % nasal spray Commonly known as:  Atrovent     metoCLOPramide 5 MG tablet Commonly known as:  Reglan   ondansetron 4 MG disintegrating tablet Commonly known as:  Zofran ODT   SUMAtriptan 50 MG tablet Commonly known as:  Imitrex     TAKE these medications   acetaminophen 500 MG tablet Commonly known as:  TYLENOL Take 500 mg by mouth every 6 (six) hours as needed for mild pain or headache.   docusate sodium 100 MG capsule Commonly known as:  Colace Take 1 capsule (100 mg total) by mouth daily as needed.   oxyCODONE-acetaminophen 5-325 MG tablet Commonly known as:  PERCOCET/ROXICET Take 1-2 tablets by mouth every 4 (four) hours as needed for moderate pain.   pantoprazole 40 MG tablet Commonly known as:  PROTONIX Take 1 tablet (40 mg total) by mouth daily.   polyethylene glycol 17 g packet Commonly known as:  MIRALAX / GLYCOLAX Take 17 g by mouth daily.   prenatal multivitamin Tabs tablet Take 1 tablet by mouth daily at 12 noon.   promethazine 12.5 MG tablet Commonly known as:  PHENERGAN Take 1 tablet (12.5 mg total) by mouth every 6 (six) hours as needed for nausea or vomiting.       Major procedures and Radiology Reports - PLEASE review detailed and final reports for all details, in brief -     Ct Abdomen Pelvis Wo Contrast  Result Date: 07/24/2018 CLINICAL DATA:  Upper mid abdominal pain worsening over the last 5 days. Patient with known intrauterine gestation. Consent for CT scan obtained by referring physician. EXAM: CT ABDOMEN AND PELVIS WITHOUT CONTRAST TECHNIQUE: Multidetector CT imaging of the abdomen and pelvis was performed following the standard protocol without IV contrast. COMPARISON:  Abdominal ultrasound 07/23/2018.  CT scan 08/12/2017. FINDINGS: Lower chest: Unremarkable. Hepatobiliary: 6 mm low-density lesion in the dome of liver is similar to prior. The liver shows diffusely decreased attenuation suggesting steatosis. Gallbladder surgically absent. No intrahepatic or extrahepatic biliary dilation.  Pancreas: No focal mass lesion. No dilatation of the main duct. No intraparenchymal cyst. No peripancreatic edema. Spleen: No splenomegaly. No focal mass lesion. Adrenals/Urinary Tract: No adrenal nodule or mass. Kidneys unremarkable on this noncontrast study. No evidence for hydroureter. The urinary bladder appears normal for the degree of distention. Stomach/Bowel: Stomach is unremarkable. No gastric wall thickening. No evidence of outlet obstruction. Duodenum is normally positioned as  is the ligament of Treitz. No small bowel wall thickening. No small bowel dilatation. The terminal ileum is normal. The appendix is normal. No gross colonic mass. No colonic wall thickening. Vascular/Lymphatic: No abdominal aortic aneurysm. No abdominal aortic atherosclerotic calcification. There is no gastrohepatic or hepatoduodenal ligament lymphadenopathy. No intraperitoneal or retroperitoneal lymphadenopathy. No pelvic sidewall lymphadenopathy. Reproductive: Focal fluid collection in the endometrium compatible with tiny gestational sac period. There is no adnexal mass. Other: No intraperitoneal free fluid. Musculoskeletal: No worrisome lytic or sclerotic osseous abnormality. IMPRESSION: 1. No acute findings in the abdomen or pelvis. Specifically, no findings to explain the patient's history of mid abdominal pain with nausea and vomiting. 2. Hepatic steatosis. Note: I was not contacted about or aware of this case prior to the patient being scanned. Electronically Signed   By: Kennith Center M.D.   On: 07/24/2018 20:04   US Ob Comp Less 14 Wks  Result Date: 07/22/2018 CLINICAL DATA:  Abdominal pain for 1 day. First trimester pregnancy. LMP 06/14/2018. Beta HCG level 22,399. EXAM: OBSTETRIC <14 WK [place] AND TRANSVAGINAL OB US DOPPLER ULTRASOUND OF OVARIES TECHNIQUE: Both transabdominal and transvaginal ultrasound examinations were performed for complete evaluation of the gestation as well as the maternal uterus, adnexal regions,  and pelvic cul-de-sac. Transvaginal technique was performed to assess early pregnancy. Color and duplex Doppler ultrasound was utilized to evaluate blood flow to the ovaries. COMPARISON:  Pelvic CT 08/12/2017. FINDINGS: Intrauterine gestational sac: Single. Yolk sac:  Visualized. Embryo:  Not visualized. Cardiac Activity: Not visualized. MSD: 10 mm   5 w   5 d Subchorionic hemorrhage:  None visualized. Maternal uterus/adnexae: Both ovaries are visualized and appear normal. No adnexal mass or free pelvic fluid. Pulsed Doppler evaluation of both ovaries demonstrates normal appearing low-resistance arterial and venous waveforms. IMPRESSION: 1. Probable early intrauterine gestational sac and yolk sac, but no fetal pole or cardiac activity yet visualized. Recommend follow-up quantitative B-HCG levels and follow-up US in 14 days to assess viability. This recommendation follows SRU consensus guidelines: Diagnostic Criteria for Nonviable Pregnancy Early in the First Trimester. Malva Limes Med 2013; 161:0960-45. 2. No adnexal mass or free pelvic fluid. 3. No evidence of ovarian torsion. Electronically Signed   By: Carey Bullocks M.D.   On: 07/22/2018 14:02   US Ob Transvaginal  Result Date: 07/27/2018 CLINICAL DATA:  Abdominal pain. First trimester pregnancy with inconclusive fetal viability. EXAM: TRANSVAGINAL OB ULTRASOUND TECHNIQUE: Transvaginal ultrasound was performed for complete evaluation of the gestation as well as the maternal uterus, adnexal regions, and pelvic cul-de-sac. COMPARISON:  07/22/2018 FINDINGS: Intrauterine gestational sac: Single Yolk sac:  Visualized. Embryo:  Visualized. Cardiac Activity: Visualized. Heart Rate: 105 bpm CRL:   4 mm   6 w 1 d                  [place] EDC: 03/21/2019 Subchorionic hemorrhage: Probable small subchorionic hemorrhage noted. Maternal uterus/adnexae: Normal appearance of both ovaries. Small left ovarian corpus luteum cyst noted. No mass or abnormal free fluid identified.  IMPRESSION: Single living IUP measuring 6 weeks 1 day, with [place] EDC of 03/21/2019. Small probable small subchorionic hemorrhage. Electronically Signed   By: Myles Rosenthal M.D.   On: 07/27/2018 08:40   US Ob Transvaginal  Result Date: 07/22/2018 CLINICAL DATA:  Abdominal pain for 1 day. First trimester pregnancy. LMP 06/14/2018. Beta HCG level 22,399. EXAM: OBSTETRIC <14 WK [place] AND TRANSVAGINAL OB US DOPPLER ULTRASOUND OF OVARIES TECHNIQUE: Both transabdominal and transvaginal ultrasound examinations were  performed for complete evaluation of the gestation as well as the maternal uterus, adnexal regions, and pelvic cul-de-sac. Transvaginal technique was performed to assess early pregnancy. Color and duplex Doppler ultrasound was utilized to evaluate blood flow to the ovaries. COMPARISON:  Pelvic CT 08/12/2017. FINDINGS: Intrauterine gestational sac: Single. Yolk sac:  Visualized. Embryo:  Not visualized. Cardiac Activity: Not visualized. MSD: 10 mm   5 w   5 d Subchorionic hemorrhage:  None visualized. Maternal uterus/adnexae: Both ovaries are visualized and appear normal. No adnexal mass or free pelvic fluid. Pulsed Doppler evaluation of both ovaries demonstrates normal appearing low-resistance arterial and venous waveforms. IMPRESSION: 1. Probable early intrauterine gestational sac and yolk sac, but no fetal pole or cardiac activity yet visualized. Recommend follow-up quantitative B-HCG levels and follow-up US in 14 days to assess viability. This recommendation follows SRU consensus guidelines: Diagnostic Criteria for Nonviable Pregnancy Early in the First Trimester. Malva Limes Engl J Med 2013; 161:0960-45; 369:1443-51. 2. No adnexal mass or free pelvic fluid. 3. No evidence of ovarian torsion. Electronically Signed   By: Carey BullocksWilliam  Veazey M.D.   On: 07/22/2018 14:02   [place] Pelvic Doppler (torsion R/o Or Mass Arterial Flow)  Result Date: 07/22/2018 CLINICAL DATA:  Abdominal pain for 1 day. First trimester pregnancy. LMP 06/14/2018. Beta  HCG level 22,399. EXAM: OBSTETRIC <14 WK US AND TRANSVAGINAL OB US DOPPLER ULTRASOUND OF OVARIES TECHNIQUE: Both transabdominal and transvaginal ultrasound examinations were performed for complete evaluation of the gestation as well as the maternal uterus, adnexal regions, and pelvic cul-de-sac. Transvaginal technique was performed to assess early pregnancy. Color and duplex Doppler ultrasound was utilized to evaluate blood flow to the ovaries. COMPARISON:  Pelvic CT 08/12/2017. FINDINGS: Intrauterine gestational sac: Single. Yolk sac:  Visualized. Embryo:  Not visualized. Cardiac Activity: Not visualized. MSD: 10 mm   5 w   5 d Subchorionic hemorrhage:  None visualized. Maternal uterus/adnexae: Both ovaries are visualized and appear normal. No adnexal mass or free pelvic fluid. Pulsed Doppler evaluation of both ovaries demonstrates normal appearing low-resistance arterial and venous waveforms. IMPRESSION: 1. Probable early intrauterine gestational sac and yolk sac, but no fetal pole or cardiac activity yet visualized. Recommend follow-up quantitative B-HCG levels and follow-up US in 14 days to assess viability. This recommendation follows SRU consensus guidelines: Diagnostic Criteria for Nonviable Pregnancy Early in the First Trimester. Malva Limes Engl J Med 2013; 409:8119-14; 369:1443-51. 2. No adnexal mass or free pelvic fluid. 3. No evidence of ovarian torsion. Electronically Signed   By: Carey BullocksWilliam  Veazey M.D.   On: 07/22/2018 14:02   [place] Abdomen Limited Ruq  Result Date: 07/23/2018 CLINICAL DATA:  23 year old female with a 5 day history of abdominal pain. EXAM: ULTRASOUND ABDOMEN LIMITED RIGHT UPPER QUADRANT COMPARISON:  Early obstetrical ultrasound performed yesterday. Prior CT scan of the abdomen and pelvis 08/12/2017 FINDINGS: Gallbladder: Surgically absent. Common bile duct: Diameter: Mildly dilated at 9 mm Liver: No focal lesion identified. Within normal limits in parenchymal echogenicity. Portal vein is patent on color  Doppler imaging with normal direction of blood flow towards the liver. IVC:  No abnormality visualized. Pancreas:   Visualized portion unremarkable. Right Kidney: Length: 9.6 cm Echogenicity within normal limits. No mass or hydronephrosis visualized. Abdominal aorta:  No aneurysm visualized. Other findings: None. IMPRESSION: 1. No significant interval change in the degree of mild dilation of the common bile duct measuring up to 9 mm compared to prior CT imaging from 08/12/2017. 2. Surgical changes of prior cholecystectomy. Electronically Signed   By: Vilma PraderHeath  Archer Asa M.D.   On: 07/23/2018 12:19    Micro Results    Recent Results (from the past 240 hour(s))  Wet prep, genital     Status: Abnormal   Collection Time: 07/24/18 10:40 PM  Result Value Ref Range Status   Yeast Wet Prep HPF POC NONE SEEN NONE SEEN Final   Trich, Wet Prep NONE SEEN NONE SEEN Final   Clue Cells Wet Prep HPF POC NONE SEEN NONE SEEN Final   WBC, Wet Prep HPF POC MANY (A) NONE SEEN Final   Sperm NONE SEEN  Final    Comment: Performed at [facility], [address] [place], [place] [address]       Today   Subjective    Angela Flynn today patient reports that she is ready to go home.  She was able to tolerate food, but still reports some epigastric discomfort.   Objective   Blood pressure 115/63, pulse 83, temperature 97.9 F (36.6 C), temperature source Oral, resp. rate 20, weight 133 kg, last menstrual period 06/14/2018, SpO2 100 %.  No intake or output data in the 24 hours ending 07/30/18 0603  Exam  Constitutional: Young obese female in NAD, calm, comfortable Eyes: PERRL, lids and conjunctivae normal ENMT: Mucous membranes are moist. Posterior pharynx clear of any exudate or lesions. Normal dentition.  Neck: normal, supple, no masses, no thyromegaly Respiratory: clear to auscultation bilaterally, no wheezing, no crackles. Normal respiratory effort. No accessory muscle use.  Cardiovascular:  Regular rate and rhythm, no murmurs / rubs / gallops. No extremity edema. 2+ pedal pulses. No carotid bruits.  Abdomen: no tenderness, no masses palpated. No hepatosplenomegaly. Bowel sounds positive.  Musculoskeletal: no clubbing / cyanosis. No joint deformity upper and lower extremities. Good ROM, no contractures. Normal muscle tone.  Skin: no rashes, lesions, ulcers. No induration Neurologic: CN 2-12 grossly intact. Sensation intact, DTR normal. Strength 5/5 in all 4.  Psychiatric: Normal judgment and insight. Alert and oriented x 3. Normal mood.    Data Review   CBC w Diff:  Lab Results  Component Value Date   WBC 7.7 07/27/2018   HGB 11.1 (L) 07/27/2018   HGB 13.0 09/05/2012   HCT 32.9 (L) 07/27/2018   HCT 39.5 09/05/2012   PLT 242 07/27/2018   PLT 269 09/05/2012   LYMPHOPCT 21 07/24/2018   LYMPHOPCT 25.1 09/05/2012   MONOPCT 5 07/24/2018   MONOPCT 4.2 09/05/2012   EOSPCT 0 07/24/2018   EOSPCT 0.4 09/05/2012   BASOPCT 0 07/24/2018   BASOPCT 0.9 09/05/2012    CMP:  Lab Results  Component Value Date   NA 136 07/28/2018   NA 140 09/05/2012   K 3.7 07/28/2018   K 3.9 09/05/2012   CL 106 07/28/2018   CL 103 09/05/2012   CO2 20 (L) 07/28/2018   CO2 27 09/05/2012   BUN <5 (L) 07/28/2018   BUN 16 09/05/2012   CREATININE 0.50 07/28/2018   CREATININE 0.64 09/05/2012   PROT 7.3 07/24/2018   PROT 7.4 09/18/2012   ALBUMIN 4.1 07/24/2018   ALBUMIN 3.8 09/18/2012   BILITOT 1.0 07/24/2018   BILITOT 0.3 09/18/2012   ALKPHOS 78 07/24/2018   ALKPHOS 94 09/18/2012   AST 23 07/24/2018   AST 15 09/18/2012   ALT 17 07/24/2018   ALT 19 09/18/2012  .   Total Time in preparing paper work, data evaluation and todays exam - 35 minutes  Clydie Braun M.D on 07/30/2018 at 6:03 AM  [facility]   Office  [phone]

## 2018-07-28 NOTE — Progress Notes (Signed)
During shift patient had an unwitnessed fall. Patient decided to tell RN when RN came to give morning medications. Patient states she " tripped over the foot of the beside table next to her bed, fell on hands and knees." States that she was fine and no serious injuries from the fall. Vitals are stable. Patient noted she fell around 0200. On call provider [place] paged. Awaiting response.

## 2018-07-28 NOTE — Progress Notes (Signed)
DISCHARGE NOTE  Jennea Tramonte to be discharged Home per MD order. Patient verbalized understanding.  Skin clean, dry and intact without evidence of skin break down, no evidence of skin tears noted. IV catheter discontinued intact. Site without signs and symptoms of complications. Dressing and pressure applied. Pt denies pain at the site currently. No complaints noted.  Patient free of lines, drains, and wounds.   Discharge packet assembled. An After Visit Summary (AVS) was printed and given to patient. Patient escorted via wheelchair and discharged to home via private auto.   Milus Banister, RN

## 2018-07-28 NOTE — Care Management (Signed)
Patient provided with MATCH letter at time of discharge.

## 2018-07-30 DIAGNOSIS — O418X10 Other specified disorders of amniotic fluid and membranes, first trimester, not applicable or unspecified: Secondary | ICD-10-CM | POA: Diagnosis present

## 2018-07-30 DIAGNOSIS — O208 Other hemorrhage in early pregnancy: Secondary | ICD-10-CM | POA: Diagnosis present

## 2018-07-30 DIAGNOSIS — O468X1 Other antepartum hemorrhage, first trimester: Secondary | ICD-10-CM

## 2018-08-06 ENCOUNTER — Encounter: Payer: Self-pay | Admitting: Family Medicine

## 2018-08-06 ENCOUNTER — Telehealth: Payer: Self-pay | Admitting: Nurse Practitioner

## 2018-08-06 NOTE — Telephone Encounter (Signed)
Covering inbox for Angela Flynn, AGPCNP-BC while she is out of office on maternity leave  Patient's mother, Angela Flynn, who is on [place], signed, called [place] from [phone] today regarding her daughter's symptoms  Patient is in process of getting medicaid for her current pregnancy, she does not have OBGYN yet.  She was seen at hospital ED 3 x in past 1 month for nausea vomiting and similar related abdominal issues.  Today now stating that her nausea medicines are not working, tried Zofran ODT, Phenergan, and Reglan, without any success.  She was recently discharged 07/28/18.  She has not been able to keep food down by report for 3 days, only tolerating liquids and water.  I advised her that there are no other anti-emetic medications I have to offer, she should keep trying existing medication that she has on hand such as Zofran ODT, and if not improving needs to seek medical attention back at hospital ED for likely IVF and it seem IV anti emetic medicine worked best.  She will promptly need attention of OBGYN, I wrote her a letter confirming her EDD 03/2019 so they can submit to medicaid to proceed.  Saralyn Pilar, DO [facility] 08/06/2018, 4:38 PM

## 2018-08-08 ENCOUNTER — Telehealth: Payer: Self-pay | Admitting: Family Medicine

## 2018-08-08 NOTE — Telephone Encounter (Signed)
Attempted to contact patient to get her scheduled for her new on intake. No answer, left detailed message with appointment date and time (5/22 @ 8:15am) as well as app instructions so she could download it

## 2018-08-09 ENCOUNTER — Ambulatory Visit: Payer: Self-pay | Attending: Family Medicine | Admitting: Family Medicine

## 2018-08-09 ENCOUNTER — Encounter (HOSPITAL_COMMUNITY): Payer: Self-pay | Admitting: *Deleted

## 2018-08-09 ENCOUNTER — Inpatient Hospital Stay (HOSPITAL_COMMUNITY)
Admission: AD | Admit: 2018-08-09 | Discharge: 2018-08-10 | Disposition: A | Discharge status: Home / Self Care | Payer: Medicaid Other | Attending: Obstetrics & Gynecology | Admitting: Obstetrics & Gynecology

## 2018-08-09 ENCOUNTER — Other Ambulatory Visit: Payer: Self-pay

## 2018-08-09 DIAGNOSIS — Z79899 Other long term (current) drug therapy: Secondary | ICD-10-CM | POA: Diagnosis not present

## 2018-08-09 DIAGNOSIS — O99331 Smoking (tobacco) complicating pregnancy, first trimester: Secondary | ICD-10-CM | POA: Insufficient documentation

## 2018-08-09 DIAGNOSIS — O26891 Other specified pregnancy related conditions, first trimester: Secondary | ICD-10-CM | POA: Diagnosis not present

## 2018-08-09 DIAGNOSIS — Z3A08 8 weeks gestation of pregnancy: Secondary | ICD-10-CM | POA: Diagnosis not present

## 2018-08-09 DIAGNOSIS — O219 Vomiting of pregnancy, unspecified: Secondary | ICD-10-CM | POA: Insufficient documentation

## 2018-08-09 DIAGNOSIS — F17290 Nicotine dependence, other tobacco product, uncomplicated: Secondary | ICD-10-CM | POA: Insufficient documentation

## 2018-08-09 DIAGNOSIS — R109 Unspecified abdominal pain: Secondary | ICD-10-CM | POA: Insufficient documentation

## 2018-08-09 DIAGNOSIS — R112 Nausea with vomiting, unspecified: Secondary | ICD-10-CM

## 2018-08-09 LAB — CBC
HCT: 39.7 % (ref 36.0–46.0)
Hemoglobin: 13.8 g/dL (ref 12.0–15.0)
MCH: 30.6 pg (ref 26.0–34.0)
MCHC: 34.8 g/dL (ref 30.0–36.0)
MCV: 88 fL (ref 80.0–100.0)
Platelets: 276 10*3/uL (ref 150–400)
RBC: 4.51 MIL/uL (ref 3.87–5.11)
RDW: 12 % (ref 11.5–15.5)
WBC: 10.9 10*3/uL — ABNORMAL HIGH (ref 4.0–10.5)
nRBC: 0 % (ref 0.0–0.2)

## 2018-08-09 MED ORDER — PROMETHAZINE HCL 25 MG/ML IJ SOLN
25.0000 mg | Freq: Once | INTRAMUSCULAR | Status: AC
  Administered 2018-08-09: 23:00:00 25 mg via INTRAVENOUS
  Filled 2018-08-09: qty 1

## 2018-08-09 MED ORDER — PYRIDOXINE HCL 100 MG/ML IJ SOLN
50.0000 mg | Freq: Once | INTRAMUSCULAR | Status: AC
  Administered 2018-08-09: 50 mg via INTRAVENOUS
  Filled 2018-08-09: qty 0.5

## 2018-08-09 MED ORDER — LACTATED RINGERS IV BOLUS
1000.0000 mL | Freq: Once | INTRAVENOUS | Status: AC
  Administered 2018-08-09: 23:00:00 1000 mL via INTRAVENOUS

## 2018-08-09 MED ORDER — DEXAMETHASONE SODIUM PHOSPHATE 10 MG/ML IJ SOLN
10.0000 mg | Freq: Once | INTRAMUSCULAR | Status: AC
  Administered 2018-08-09: 10 mg via INTRAVENOUS
  Filled 2018-08-09: qty 1

## 2018-08-09 MED ORDER — FAMOTIDINE IN NACL 20-0.9 MG/50ML-% IV SOLN
20.0000 mg | Freq: Once | INTRAVENOUS | Status: AC
  Administered 2018-08-09: 20 mg via INTRAVENOUS
  Filled 2018-08-09: qty 50

## 2018-08-09 MED ORDER — SCOPOLAMINE 1 MG/3DAYS TD PT72
1.0000 | MEDICATED_PATCH | Freq: Once | TRANSDERMAL | Status: DC
  Administered 2018-08-09: 1.5 mg via TRANSDERMAL
  Filled 2018-08-09: qty 1

## 2018-08-09 NOTE — MAU Note (Addendum)
Pt presents to MAU from [facility] reporting NVD and upper and lower abdominal pain. Pt reports she has not ate in 4days. Unable to obtain much other information from pt due to her vomiting. Pt reports dizziness and HA. Unsure of the medications that have been taken by the pt she is unsure. Pt reports back pain that shoots in her back to her legs.

## 2018-08-09 NOTE — MAU Provider Note (Signed)
Chief Complaint: Abdominal Pain; Nausea; and Emesis   First Provider Initiated Contact with Patient 08/09/18 2245        SUBJECTIVE HPI: Angela Flynn is a 23 y.o. G3P0020 at 110w0d by LMP who presents to maternity admissions reporting nausea and vomiting.  States "I take my medicines every day and they don't help".  Asked what she took today and states "I took one thing my mom gave me this morning" and denies trying anything else all day or night since then.  Discussed Zofran is good to try because it dissolves.  States "I can't".   Discussed suppositories and she adamantly refuses to try them.  Has been in hospital recently (discharged 4/18) with a full GI workup including CT and [place].  Has a viable 8 week pregnancy per [place].  Marland Kitchen She denies vaginal bleeding, vaginal itching/burning, urinary symptoms, h/a, dizziness, or fever/chills.      Past Medical History:  Diagnosis Date  . Allergy    seasonal, dusts  . Herpes genitalis   . MRSA infection    abscesses - history of   Past Surgical History:  Procedure Laterality Date  . CHOLECYSTECTOMY, LAPAROSCOPIC    . TONSILLECTOMY     Social History   Socioeconomic History  . Marital status: Single    Spouse name: Not on file  . Number of children: Not on file  . Years of education: Not on file  . Highest education level: Not on file  Occupational History  . Not on file  Social Needs  . Financial resource strain: Not on file  . Food insecurity:    Worry: Not on file    Inability: Not on file  . Transportation needs:    Medical: Not on file    Non-medical: Not on file  Tobacco Use  . Smoking status: Current Every Day Smoker    Types: Cigars  . Smokeless tobacco: Never Used  . Tobacco comment: 2-5 cigars per day on average, which pt uses to smoke marijuana  Substance and Sexual Activity  . Alcohol use: No  . Drug use: Yes    Types: Marijuana    Comment: 2-5 x per day  . Sexual activity: Yes    Birth control/protection: None   Lifestyle  . Physical activity:    Days per week: Not on file    Minutes per session: Not on file  . Stress: Not on file  Relationships  . Social connections:    Talks on phone: Not on file    Gets together: Not on file    Attends religious service: Not on file    Active member of club or organization: Not on file    Attends meetings of clubs or organizations: Not on file    Relationship status: Not on file  . Intimate partner violence:    Fear of current or ex partner: Not on file    Emotionally abused: Not on file    Physically abused: Not on file    Forced sexual activity: Not on file  Other Topics Concern  . Not on file  Social History Narrative  . Not on file   No current facility-administered medications on file prior to encounter.    Current Outpatient Medications on File Prior to Encounter  Medication Sig Dispense Refill  . acetaminophen (TYLENOL) 500 MG tablet Take 500 mg by mouth every 6 (six) hours as needed for mild pain or headache.     . docusate sodium (COLACE) 100 MG  capsule Take 1 capsule (100 mg total) by mouth daily as needed. 30 capsule 0  . oxyCODONE-acetaminophen (PERCOCET/ROXICET) 5-325 MG tablet Take 1-2 tablets by mouth every 4 (four) hours as needed for moderate pain. 15 tablet 0  . pantoprazole (PROTONIX) 40 MG tablet Take 1 tablet (40 mg total) by mouth daily. 30 tablet 0  . polyethylene glycol (MIRALAX / GLYCOLAX) 17 g packet Take 17 g by mouth daily. 14 each 0  . Prenatal Vit-Fe Fumarate-FA (PRENATAL MULTIVITAMIN) TABS tablet Take 1 tablet by mouth daily at 12 noon. 30 tablet 0  . promethazine (PHENERGAN) 12.5 MG tablet Take 1 tablet (12.5 mg total) by mouth every 6 (six) hours as needed for nausea or vomiting. 20 tablet 0   No Known Allergies  I have reviewed patient's Past Medical Hx, Surgical Hx, Family Hx, Social Hx, medications and allergies.   ROS:  Review of Systems  Constitutional: Negative for chills and fever.  Gastrointestinal:  Positive for nausea and vomiting. Negative for constipation and diarrhea.  Genitourinary: Negative for vaginal bleeding.   Review of Systems  Other systems negative   Physical Exam  Physical Exam No data found. Constitutional: Well-developed female in no acute distress, but retching.  Cardiovascular: normal rate Respiratory: normal effort GI: Abd soft, non-tender. Pos BS x 4 MS: Extremities nontender, no edema, normal ROM Neurologic: Alert and oriented x 4.  GU: Neg CVAT.  PELVIC EXAM: deferred  LAB RESULTS Results for orders placed or performed during the hospital encounter of 08/09/18 (from the past 24 hour(s))  Comprehensive metabolic panel     Status: Abnormal   Collection Time: 08/09/18 11:12 PM  Result Value Ref Range   Sodium 136 135 - 145 mmol/L   Potassium 3.5 3.5 - 5.1 mmol/L   Chloride 104 98 - 111 mmol/L   CO2 21 (L) 22 - 32 mmol/L   Glucose, Bld 108 (H) 70 - 99 mg/dL   BUN 7 6 - 20 mg/dL   Creatinine, Ser 1.610.59 0.44 - 1.00 mg/dL   Calcium 9.2 8.9 - 09.610.3 mg/dL   Total Protein 7.1 6.5 - 8.1 g/dL   Albumin 3.8 3.5 - 5.0 g/dL   AST 22 15 - 41 U/L   ALT 23 0 - 44 U/L   Alkaline Phosphatase 54 38 - 126 U/L   Total Bilirubin 0.8 0.3 - 1.2 mg/dL   GFR calc non Af Amer >60 >60 mL/min   GFR calc Af Amer >60 >60 mL/min   Anion gap 11 5 - 15  CBC     Status: Abnormal   Collection Time: 08/09/18 11:12 PM  Result Value Ref Range   WBC 10.9 (H) 4.0 - 10.5 K/uL   RBC 4.51 3.87 - 5.11 MIL/uL   Hemoglobin 13.8 12.0 - 15.0 g/dL   HCT 04.539.7 40.936.0 - 81.146.0 %   MCV 88.0 80.0 - 100.0 fL   MCH 30.6 26.0 - 34.0 pg   MCHC 34.8 30.0 - 36.0 g/dL   RDW 91.412.0 78.211.5 - 95.615.5 %   Platelets 276 150 - 400 K/uL   nRBC 0.0 0.0 - 0.2 %      IMAGING   MAU Management/MDM: Ordered usual treatment for hyperemesis Gave her two liters of fluid Phenergan helped stop vomiting Also added Vitamin B6 and Decadron Scope patch applied Pepcid given Felt much better after timepassed with  treatment   ASSESSMENT Single intrauterine pregnancy at 7491w1d Nausea and vomiting, ?hyperemesis  PLAN Discharge home Rx Phenergan for nausea Rx VItamin  B6 and Scopolomine patch for additional treatment Has pepcid and zofran at home, encouraged to take them  Pt stable at time of discharge. Encouraged to return here or to other Urgent Care/ED if she develops worsening of symptoms, increase in pain, fever, or other concerning symptoms.    Wynelle Bourgeois CNM, MSN Certified Nurse-Midwife 08/09/2018  10:45 PM

## 2018-08-09 NOTE — ED Notes (Signed)
Report given to MAU, pt to be transported over. [redacted] weeks pregnant, N/V, abd pain

## 2018-08-10 DIAGNOSIS — Z3A08 8 weeks gestation of pregnancy: Secondary | ICD-10-CM

## 2018-08-10 DIAGNOSIS — O219 Vomiting of pregnancy, unspecified: Secondary | ICD-10-CM

## 2018-08-10 LAB — COMPREHENSIVE METABOLIC PANEL
ALT: 23 U/L (ref 0–44)
AST: 22 U/L (ref 15–41)
Albumin: 3.8 g/dL (ref 3.5–5.0)
Alkaline Phosphatase: 54 U/L (ref 38–126)
Anion gap: 11 (ref 5–15)
BUN: 7 mg/dL (ref 6–20)
CO2: 21 mmol/L — ABNORMAL LOW (ref 22–32)
Calcium: 9.2 mg/dL (ref 8.9–10.3)
Chloride: 104 mmol/L (ref 98–111)
Creatinine, Ser: 0.59 mg/dL (ref 0.44–1.00)
GFR calc Af Amer: >60 mL/min (ref >60)
GFR calc non Af Amer: >60 mL/min (ref >60)
Glucose, Bld: 108 mg/dL — ABNORMAL HIGH (ref 70–99)
Potassium: 3.5 mmol/L (ref 3.5–5.1)
Sodium: 136 mmol/L (ref 135–145)
Total Bilirubin: 0.8 mg/dL (ref 0.3–1.2)
Total Protein: 7.1 g/dL (ref 6.5–8.1)

## 2018-08-10 MED ORDER — LACTATED RINGERS IV BOLUS
1000.0000 mL | Freq: Once | INTRAVENOUS | Status: AC
  Administered 2018-08-10: 1000 mL via INTRAVENOUS

## 2018-08-10 MED ORDER — SCOPOLAMINE 1 MG/3DAYS TD PT72: 1.0000 | MEDICATED_PATCH | TRANSDERMAL | 1 refills | Status: DC

## 2018-08-10 MED ORDER — VITAMIN B-6 50 MG PO TABS: 50.0000 mg | ORAL_TABLET | Freq: Two times a day (BID) | ORAL | 0 refills | Status: DC

## 2018-08-10 NOTE — Discharge Instructions (Signed)
Hyperemesis Gravidarum  Hyperemesis gravidarum is a severe form of nausea and vomiting that happens during pregnancy. Hyperemesis is worse than morning sickness. It may cause you to have nausea or vomiting all day for many days. It may keep you from eating and drinking enough food and liquids, which can lead to dehydration, malnutrition, and weight loss. Hyperemesis usually occurs during the first half (the first 20 weeks) of pregnancy. It often goes away once a woman is in her second half of pregnancy. However, sometimes hyperemesis continues through an entire pregnancy.  What are the causes?  The cause of this condition is not known. It may be related to changes in chemicals (hormones) in the body during pregnancy, such as the high level of pregnancy hormone (human chorionic gonadotropin) or the increase in the female sex hormone (estrogen).  What are the signs or symptoms?  Symptoms of this condition include:  Nausea that does not go away.  Vomiting that does not allow you to keep any food down.  Weight loss.  Body fluid loss (dehydration).  Having no desire to eat, or not liking food that you have previously enjoyed.  How is this diagnosed?  This condition may be diagnosed based on:  A physical exam.  Your medical history.  Your symptoms.  Blood tests.  Urine tests.  How is this treated?  This condition is managed by controlling symptoms. This may include:  Following an eating plan. This can help lessen nausea and vomiting.  Taking prescription medicines.  An eating plan and medicines are often used together to help control symptoms. If medicines do not help relieve nausea and vomiting, you may need to receive fluids through an IV at the hospital.  Follow these instructions at home:  Eating and drinking    Avoid the following:  Drinking fluids with meals. Try not to drink anything during the 30 minutes before and after your meals.  Drinking more than 1 cup of fluid at a time.  Eating foods that trigger your  symptoms. These may include spicy foods, coffee, high-fat foods, very sweet foods, and acidic foods.  Skipping meals. Nausea can be more intense on an empty stomach. If you cannot tolerate food, do not force it. Try sucking on ice chips or other frozen items and make up for missed calories later.  Lying down within 2 hours after eating.  Being exposed to environmental triggers. These may include food smells, smoky rooms, closed spaces, rooms with strong smells, warm or humid places, overly loud and noisy rooms, and rooms with motion or flickering lights. Try eating meals in a well-ventilated area that is free of strong smells.  Quick and sudden changes in your movement.  Taking iron pills and multivitamins that contain iron. If you take prescription iron pills, do not stop taking them unless your health care provider approves.  Preparing food. The smell of food can spoil your appetite or trigger nausea.  To help relieve your symptoms:  Listen to your body. Everyone is different and has different preferences. Find what works best for you.  Eat and drink slowly.  Eat 5-6 small meals daily instead of 3 large meals. Eating small meals and snacks can help you avoid an empty stomach.  In the morning, before getting out of bed, eat a couple of crackers to avoid moving around on an empty stomach.  Try eating starchy foods as these are usually tolerated well. Examples include cereal, toast, bread, potatoes, pasta, rice, and pretzels.  Include at   least 1 serving of protein with your meals and snacks. Protein options include lean meats, poultry, seafood, beans, nuts, nut butters, eggs, cheese, and yogurt.  Try eating a protein-rich snack before bed. Examples of a protein-rick snack include cheese and crackers or a peanut butter sandwich made with 1 slice of whole-wheat bread and 1 tsp (5 g) of peanut butter.  Eat or suck on things that have ginger in them. It may help relieve nausea. Add  tsp ground ginger to hot tea or  choose ginger tea.  Try drinking 100% fruit juice or an electrolyte drink. An electrolyte drink contains sodium, potassium, and chloride.  Drink fluids that are cold, clear, and carbonated or sour. Examples include lemonade, ginger ale, lemon-lime soda, ice water, and sparkling water.  Brush your teeth or use a mouth rinse after meals.  Talk with your health care provider about starting a supplement of vitamin B6.  General instructions  Take over-the-counter and prescription medicines only as told by your health care provider.  Follow instructions from your health care provider about eating or drinking restrictions.  Continue to take your prenatal vitamins as told by your health care provider. If you are having trouble taking your prenatal vitamins, talk with your health care provider about different options.  Keep all follow-up and pre-birth (prenatal) visits as told by your health care provider. This is important.  Contact a health care provider if:  You have pain in your abdomen.  You have a severe headache.  You have vision problems.  You are losing weight.  You feel weak or dizzy.  Get help right away if:  You cannot drink fluids without vomiting.  You vomit blood.  You have constant nausea and vomiting.  You are very weak.  You faint.  You have a fever and your symptoms suddenly get worse.  Summary  Hyperemesis gravidarum is a severe form of nausea and vomiting that happens during pregnancy.  Making some changes to your eating habits may help relieve nausea and vomiting.  This condition may be managed with medicine.  If medicines do not help relieve nausea and vomiting, you may need to receive fluids through an IV at the hospital.  This information is not intended to replace advice given to you by your health care provider. Make sure you discuss any questions you have with your health care provider.  Document Released: 03/28/2005 Document Revised: 04/17/2017 Document Reviewed: 11/25/2015  [employer] Interactive  Patient Education  2019 [employer]

## 2018-08-21 ENCOUNTER — Other Ambulatory Visit: Payer: Self-pay

## 2018-08-21 ENCOUNTER — Inpatient Hospital Stay (HOSPITAL_COMMUNITY)
Admission: EM | Admit: 2018-08-21 | Discharge: 2018-08-23 | DRG: 832 | Disposition: A | Discharge status: Home / Self Care | Payer: Medicaid Other | Attending: Obstetrics and Gynecology | Admitting: Obstetrics and Gynecology

## 2018-08-21 ENCOUNTER — Encounter (HOSPITAL_COMMUNITY): Payer: Self-pay

## 2018-08-21 DIAGNOSIS — O99211 Obesity complicating pregnancy, first trimester: Secondary | ICD-10-CM | POA: Diagnosis present

## 2018-08-21 DIAGNOSIS — O468X1 Other antepartum hemorrhage, first trimester: Secondary | ICD-10-CM

## 2018-08-21 DIAGNOSIS — A6000 Herpesviral infection of urogenital system, unspecified: Secondary | ICD-10-CM | POA: Diagnosis present

## 2018-08-21 DIAGNOSIS — O210 Mild hyperemesis gravidarum: Principal | ICD-10-CM

## 2018-08-21 DIAGNOSIS — IMO0001 Reserved for inherently not codable concepts without codable children: Secondary | ICD-10-CM

## 2018-08-21 DIAGNOSIS — O418X10 Other specified disorders of amniotic fluid and membranes, first trimester, not applicable or unspecified: Secondary | ICD-10-CM

## 2018-08-21 DIAGNOSIS — Z3A09 9 weeks gestation of pregnancy: Secondary | ICD-10-CM

## 2018-08-21 DIAGNOSIS — Z1159 Encounter for screening for other viral diseases: Secondary | ICD-10-CM

## 2018-08-21 DIAGNOSIS — O208 Other hemorrhage in early pregnancy: Secondary | ICD-10-CM | POA: Diagnosis present

## 2018-08-21 DIAGNOSIS — O98311 Other infections with a predominantly sexual mode of transmission complicating pregnancy, first trimester: Secondary | ICD-10-CM | POA: Diagnosis present

## 2018-08-21 DIAGNOSIS — Z87891 Personal history of nicotine dependence: Secondary | ICD-10-CM

## 2018-08-21 LAB — CBC WITH DIFFERENTIAL/PLATELET
Abs Immature Granulocytes: 0.04 10*3/uL (ref 0.00–0.07)
Basophils Absolute: 0 10*3/uL (ref 0.0–0.1)
Basophils Relative: 0 %
Eosinophils Absolute: 0 10*3/uL (ref 0.0–0.5)
Eosinophils Relative: 0 %
HCT: 43.3 % (ref 36.0–46.0)
Hemoglobin: 15.1 g/dL — ABNORMAL HIGH (ref 12.0–15.0)
Immature Granulocytes: 0 %
Lymphocytes Relative: 13 %
Lymphs Abs: 1.4 10*3/uL (ref 0.7–4.0)
MCH: 30.4 pg (ref 26.0–34.0)
MCHC: 34.9 g/dL (ref 30.0–36.0)
MCV: 87.1 fL (ref 80.0–100.0)
Monocytes Absolute: 0.5 10*3/uL (ref 0.1–1.0)
Monocytes Relative: 5 %
Neutro Abs: 8.9 10*3/uL — ABNORMAL HIGH (ref 1.7–7.7)
Neutrophils Relative %: 82 %
Platelets: 287 10*3/uL (ref 150–400)
RBC: 4.97 MIL/uL (ref 3.87–5.11)
RDW: 12 % (ref 11.5–15.5)
WBC: 10.8 10*3/uL — ABNORMAL HIGH (ref 4.0–10.5)
nRBC: 0 % (ref 0.0–0.2)

## 2018-08-21 LAB — URINALYSIS, ROUTINE W REFLEX MICROSCOPIC
Bacteria, UA: NONE SEEN
Bilirubin Urine: NEGATIVE
Glucose, UA: NEGATIVE mg/dL
Hgb urine dipstick: NEGATIVE
Ketones, ur: 80 mg/dL — AB
Leukocytes,Ua: NEGATIVE
Nitrite: NEGATIVE
Protein, ur: 100 mg/dL — AB
Specific Gravity, Urine: 1.03 (ref 1.005–1.030)
pH: 6 (ref 5.0–8.0)

## 2018-08-21 LAB — RAPID URINE DRUG SCREEN, HOSP PERFORMED
Amphetamines: NOT DETECTED
Barbiturates: NOT DETECTED
Benzodiazepines: NOT DETECTED
Cocaine: NOT DETECTED
Opiates: NOT DETECTED
Tetrahydrocannabinol: POSITIVE — AB

## 2018-08-21 LAB — COMPREHENSIVE METABOLIC PANEL
ALT: 69 U/L — ABNORMAL HIGH (ref 0–44)
AST: 40 U/L (ref 15–41)
Albumin: 4.2 g/dL (ref 3.5–5.0)
Alkaline Phosphatase: 69 U/L (ref 38–126)
Anion gap: 11 (ref 5–15)
BUN: 7 mg/dL (ref 6–20)
CO2: 20 mmol/L — ABNORMAL LOW (ref 22–32)
Calcium: 9.9 mg/dL (ref 8.9–10.3)
Chloride: 104 mmol/L (ref 98–111)
Creatinine, Ser: 0.56 mg/dL (ref 0.44–1.00)
GFR calc Af Amer: >60 mL/min (ref >60)
GFR calc non Af Amer: >60 mL/min (ref >60)
Glucose, Bld: 101 mg/dL — ABNORMAL HIGH (ref 70–99)
Potassium: 3.9 mmol/L (ref 3.5–5.1)
Sodium: 135 mmol/L (ref 135–145)
Total Bilirubin: 1 mg/dL (ref 0.3–1.2)
Total Protein: 8.1 g/dL (ref 6.5–8.1)

## 2018-08-21 LAB — WET PREP, GENITAL
Clue Cells Wet Prep HPF POC: NONE SEEN
Sperm: NONE SEEN
Trich, Wet Prep: NONE SEEN
WBC, Wet Prep HPF POC: NONE SEEN
Yeast Wet Prep HPF POC: NONE SEEN

## 2018-08-21 LAB — TSH: TSH: 0.065 u[IU]/mL — ABNORMAL LOW (ref 0.350–4.500)

## 2018-08-21 LAB — T4, FREE: Free T4: 1 ng/dL (ref 0.82–1.77)

## 2018-08-21 LAB — HEMOGLOBIN A1C
Hgb A1c MFr Bld: 5.3 % (ref 4.8–5.6)
Mean Plasma Glucose: 105.41 mg/dL

## 2018-08-21 LAB — SARS CORONAVIRUS 2 BY RT PCR (HOSPITAL ORDER, PERFORMED IN ~~LOC~~ HOSPITAL LAB): SARS Coronavirus 2: NEGATIVE

## 2018-08-21 LAB — LIPASE, BLOOD: Lipase: 23 U/L (ref 11–51)

## 2018-08-21 MED ORDER — ENOXAPARIN SODIUM 60 MG/0.6ML ~~LOC~~ SOLN
60.0000 mg | SUBCUTANEOUS | Status: DC
  Filled 2018-08-21 (×2): qty 0.6

## 2018-08-21 MED ORDER — LACTATED RINGERS IV BOLUS
2000.0000 mL | Freq: Once | INTRAVENOUS | Status: AC
  Administered 2018-08-21: 2000 mL via INTRAVENOUS

## 2018-08-21 MED ORDER — METOCLOPRAMIDE HCL 5 MG/ML IJ SOLN
10.0000 mg | Freq: Four times a day (QID) | INTRAMUSCULAR | Status: DC | PRN
  Administered 2018-08-21 – 2018-08-23 (×5): 10 mg via INTRAVENOUS
  Filled 2018-08-21 (×5): qty 2

## 2018-08-21 MED ORDER — ZOLPIDEM TARTRATE 5 MG PO TABS
5.0000 mg | ORAL_TABLET | Freq: Every evening | ORAL | Status: DC | PRN
  Administered 2018-08-22: 5 mg via ORAL
  Filled 2018-08-21: qty 1

## 2018-08-21 MED ORDER — DOCUSATE SODIUM 100 MG PO CAPS
100.0000 mg | ORAL_CAPSULE | Freq: Every day | ORAL | Status: DC
  Administered 2018-08-22: 100 mg via ORAL
  Filled 2018-08-21 (×2): qty 1

## 2018-08-21 MED ORDER — SCOPOLAMINE 1 MG/3DAYS TD PT72
1.0000 | MEDICATED_PATCH | TRANSDERMAL | Status: DC
  Administered 2018-08-22: 1.5 mg via TRANSDERMAL
  Filled 2018-08-21: qty 1

## 2018-08-21 MED ORDER — PROMETHAZINE HCL 25 MG/ML IJ SOLN
25.0000 mg | INTRAVENOUS | Status: DC
  Administered 2018-08-21 – 2018-08-22 (×2): 25 mg via INTRAVENOUS
  Filled 2018-08-21 (×2): qty 1

## 2018-08-21 MED ORDER — ACETAMINOPHEN 325 MG PO TABS: 650.0000 mg | ORAL_TABLET | ORAL | Status: DC | PRN

## 2018-08-21 MED ORDER — PRENATAL MULTIVITAMIN CH
1.0000 | ORAL_TABLET | Freq: Every day | ORAL | Status: DC
  Administered 2018-08-22: 1 via ORAL
  Filled 2018-08-21: qty 1

## 2018-08-21 MED ORDER — FAMOTIDINE IN NACL 20-0.9 MG/50ML-% IV SOLN
20.0000 mg | Freq: Once | INTRAVENOUS | Status: AC
  Administered 2018-08-21: 20 mg via INTRAVENOUS
  Filled 2018-08-21: qty 50

## 2018-08-21 MED ORDER — THIAMINE HCL 100 MG/ML IJ SOLN
100.0000 mg | Freq: Every day | INTRAMUSCULAR | Status: AC
  Administered 2018-08-21 – 2018-08-23 (×3): 100 mg via INTRAVENOUS
  Filled 2018-08-21 (×4): qty 1

## 2018-08-21 MED ORDER — CALCIUM CARBONATE ANTACID 500 MG PO CHEW: 2.0000 | CHEWABLE_TABLET | ORAL | Status: DC | PRN

## 2018-08-21 MED ORDER — PROMETHAZINE HCL 25 MG/ML IJ SOLN
25.0000 mg | Freq: Once | INTRAMUSCULAR | Status: AC
  Administered 2018-08-21: 25 mg via INTRAVENOUS
  Filled 2018-08-21: qty 1

## 2018-08-21 MED ORDER — FAMOTIDINE IN NACL 20-0.9 MG/50ML-% IV SOLN
20.0000 mg | Freq: Two times a day (BID) | INTRAVENOUS | Status: DC
  Administered 2018-08-22: 20 mg via INTRAVENOUS
  Filled 2018-08-21 (×2): qty 50

## 2018-08-21 NOTE — ED Triage Notes (Signed)
Pt in c/o n/v with white discharge with urination, pt [redacted] wks pregnant, due date12/10/20

## 2018-08-21 NOTE — H&P (Signed)
ANTEPARTUM ADMISSION HISTORY AND PHYSICAL  23yo G1P0 at [redacted]w[redacted]d by L/6 presenting for persistent nausea, vomiting, and new-onset vaginal discharge and dysuria. Has PMH of lap chole, morbid obesity, PID, HSV, MRSA abscesses. Has had nausea and vomiting for several weeks, last seen 2 weeks ago. States has been trying all the medications she has been prescribed and nothing seems to be working (has Rx for zofran, protonix, phenergan, scopolamine, b6). States she thinks she throws up 12-15 times per day. Often feels like she needs to have a BM but unable to, last had one yesterday that was pretty normal. Denies diarrhea. Has had this feeling since first seen in ER for related symptoms 4/14. States has burning in throat that is there even when not throwing up. Denies cough, shortness of breath. Does state she feels weak and tired. Denies any sick contacts. Has only been having some saltines and occasional different things to drink but frequently throws them back up. Also describes a thick, white chunky material that sometimes gets stuck in the back of her throat. Has also noticed similar appearing vaginal discharge. Had one episode of dysuria yesterday but has resolved. Denies any new sexual contacts, has not had sex in last 2 weeks because of feeling poorly. Denies fevers, chills.   Admits to MJ use in the last month but hasn't had any recently since found out she was pregnant.   Patient seen in ER for abdominal pain 4/12, 4/13, and 4/14, admitted for abdominal pain 4/15-4/18 and also seen in MAU 4/30 for nausea/vomiting. Has history of lap chole. Has had workup including TVUS, RUQ U/S with chronic CBD dilation. Also had CT scan 4/14 w/o contrast which showed no acute findings, hepatic steatosis present. During admission, was treated with Dilaudid, phenergan, IVF, protonix. Was also given one-time doses of rocephin and azithro for possible cervicitis. Improved in MAU a couple of weeks ago with steroids, pepcid,  anti-emetic and fluids.   MAU Course -- extremely uncomfortable appearing, spitting excessively into bag and dry heaving - will insert PIV, give 2L LR bolus, check CMP/CBC/TSH and also give protonix and phenergan -- plan to check wet prep and G/C as well as U/A + culture given discharge and occasional burning with urination  -- has lost 37 lbs since discharge 4/18  -- no improvement and unable to tolerate po -- TSH low, will add-on free T4 as hyperthyroidism could certainly be contributing to symptoms  -- still need UDS and U/A + culture but has been unable to void thus far -- discussed with Dr. Jolayne Panther and agreed with admission for IVF, anti-emetics, bowel rest and po challenge in 24-48 hours   Past Medical History: Past Medical History:  Diagnosis Date  . Allergy    seasonal, dusts  . Herpes genitalis   . MRSA infection    abscesses - history of    Past Surgical History: Past Surgical History:  Procedure Laterality Date  . CHOLECYSTECTOMY, LAPAROSCOPIC    . TONSILLECTOMY      Obstetrical History: OB History    Gravida  1   Para      Term      Preterm      AB  0   Living  0     SAB  0   TAB      Ectopic      Multiple      Live Births              Social History: Social History  Socioeconomic History  . Marital status: Single    Spouse name: Not on file  . Number of children: Not on file  . Years of education: Not on file  . Highest education level: Not on file  Occupational History  . Not on file  Social Needs  . Financial resource strain: Not on file  . Food insecurity:    Worry: Not on file    Inability: Not on file  . Transportation needs:    Medical: Not on file    Non-medical: Not on file  Tobacco Use  . Smoking status: Former Smoker    Types: Cigars  . Smokeless tobacco: Never Used  . Tobacco comment: 2-5 cigars per day on average, which pt uses to smoke marijuana  Substance and Sexual Activity  . Alcohol use: No  . Drug  use: Not Currently    Types: Marijuana    Comment: 2-5 x per day  . Sexual activity: Yes    Birth control/protection: None  Lifestyle  . Physical activity:    Days per week: Not on file    Minutes per session: Not on file  . Stress: Not on file  Relationships  . Social connections:    Talks on phone: Not on file    Gets together: Not on file    Attends religious service: Not on file    Active member of club or organization: Not on file    Attends meetings of clubs or organizations: Not on file    Relationship status: Not on file  Other Topics Concern  . Not on file  Social History Narrative  . Not on file    Family History: Family History  Problem Relation Age of Onset  . Bipolar disorder Mother   . Heart disease Sister   . Diabetes Maternal Grandmother     Allergies: No Known Allergies  Medications Prior to Admission  Medication Sig Dispense Refill Last Dose  . ondansetron (ZOFRAN-ODT) 4 MG disintegrating tablet DISSOLVE 1 TABLET IN MOUTH EVERY 8 HOURS AS NEEDED FOR NAUSEA OR VOMITING   08/20/2018 at Unknown time  . Prenatal Vit-Fe Fumarate-FA (PRENATAL MULTIVITAMIN) TABS tablet Take 1 tablet by mouth daily at 12 noon. 30 tablet 0 Past Month at Unknown time  . promethazine (PHENERGAN) 12.5 MG tablet Take 1 tablet (12.5 mg total) by mouth every 6 (six) hours as needed for nausea or vomiting. 20 tablet 0 Past Week at Unknown time  . scopolamine (TRANSDERM-SCOP, 1.5 MG,) 1 MG/3DAYS Place 1 patch (1.5 mg total) onto the skin every 3 (three) days. 5 patch 1 08/21/2018 at Unknown time  . acetaminophen (TYLENOL) 500 MG tablet Take 500 mg by mouth every 6 (six) hours as needed for mild pain or headache.    Unknown at Unknown time  . docusate sodium (COLACE) 100 MG capsule Take 1 capsule (100 mg total) by mouth daily as needed. 30 capsule 0 Past Week at Unknown time  . oxyCODONE-acetaminophen (PERCOCET/ROXICET) 5-325 MG tablet Take 1-2 tablets by mouth every 4 (four) hours as needed  for moderate pain. 15 tablet 0 Past Week at Unknown time  . pantoprazole (PROTONIX) 40 MG tablet Take 1 tablet (40 mg total) by mouth daily. 30 tablet 0 Unknown at Unknown time  . polyethylene glycol (MIRALAX / GLYCOLAX) 17 g packet Take 17 g by mouth daily. 14 each 0 Unknown at Unknown time  . pyridOXINE (VITAMIN B-6) 50 MG tablet Take 1 tablet (50 mg total) by mouth every 12 (twelve)  hours. 30 tablet 0      Review of Systems  All systems reviewed and negative except as stated in HPI  Blood pressure 120/72, pulse 89, temperature 99.3 F (37.4 C), temperature source Axillary, resp. rate 20, height 5\' 9"  (1.753 m), weight 116.4 kg, last menstrual period 06/14/2018, SpO2 99 %. Physical Exam  Nursing note and vitals reviewed. Constitutional: She is oriented to person, place, and time. No distress.  Uncomfortable appearing, sitting up on edge of bed spitting into bag, slightly tremulous  HENT:  Head: Normocephalic and atraumatic.  Eyes: Conjunctivae and EOM are normal. No scleral icterus.  Neck: Neck supple. No thyromegaly present.  Cardiovascular: Normal rate, regular rhythm, normal heart sounds and intact distal pulses.  No murmur heard. Respiratory: Effort normal and breath sounds normal. She has no wheezes.  GI: Soft. Bowel sounds are normal. She exhibits no distension. There is abdominal tenderness (diffuse, mostly epigastric). There is no guarding.  Genitourinary:    Genitourinary Comments: Deferred, self-collect swabs   Musculoskeletal:        General: No edema.  Neurological: She is alert and oriented to person, place, and time. No cranial nerve deficit.  Skin: Skin is warm and dry. No rash noted.  Psychiatric: She has a normal mood and affect. Her behavior is normal.   Results for orders placed or performed during the hospital encounter of 08/21/18 (from the past 24 hour(s))  Comprehensive metabolic panel   Collection Time: 08/21/18  4:03 PM  Result Value Ref Range   Sodium  135 135 - 145 mmol/L   Potassium 3.9 3.5 - 5.1 mmol/L   Chloride 104 98 - 111 mmol/L   CO2 20 (L) 22 - 32 mmol/L   Glucose, Bld 101 (H) 70 - 99 mg/dL   BUN 7 6 - 20 mg/dL   Creatinine, Ser 0.94 0.44 - 1.00 mg/dL   Calcium 9.9 8.9 - 70.9 mg/dL   Total Protein 8.1 6.5 - 8.1 g/dL   Albumin 4.2 3.5 - 5.0 g/dL   AST 40 15 - 41 U/L   ALT 69 (H) 0 - 44 U/L   Alkaline Phosphatase 69 38 - 126 U/L   Total Bilirubin 1.0 0.3 - 1.2 mg/dL   GFR calc non Af Amer >60 >60 mL/min   GFR calc Af Amer >60 >60 mL/min   Anion gap 11 5 - 15  CBC with Differential/Platelet   Collection Time: 08/21/18  4:03 PM  Result Value Ref Range   WBC 10.8 (H) 4.0 - 10.5 K/uL   RBC 4.97 3.87 - 5.11 MIL/uL   Hemoglobin 15.1 (H) 12.0 - 15.0 g/dL   HCT 62.8 36.6 - 29.4 %   MCV 87.1 80.0 - 100.0 fL   MCH 30.4 26.0 - 34.0 pg   MCHC 34.9 30.0 - 36.0 g/dL   RDW 76.5 46.5 - 03.5 %   Platelets 287 150 - 400 K/uL   nRBC 0.0 0.0 - 0.2 %   Neutrophils Relative % 82 %   Neutro Abs 8.9 (H) 1.7 - 7.7 K/uL   Lymphocytes Relative 13 %   Lymphs Abs 1.4 0.7 - 4.0 K/uL   Monocytes Relative 5 %   Monocytes Absolute 0.5 0.1 - 1.0 K/uL   Eosinophils Relative 0 %   Eosinophils Absolute 0.0 0.0 - 0.5 K/uL   Basophils Relative 0 %   Basophils Absolute 0.0 0.0 - 0.1 K/uL   Immature Granulocytes 0 %   Abs Immature Granulocytes 0.04 0.00 - 0.07 K/uL  Lipase, blood   Collection Time: 08/21/18  4:03 PM  Result Value Ref Range   Lipase 23 11 - 51 U/L  TSH   Collection Time: 08/21/18  4:30 PM  Result Value Ref Range   TSH 0.065 (L) 0.350 - 4.500 uIU/mL  Hemoglobin A1c   Collection Time: 08/21/18  4:30 PM  Result Value Ref Range   Hgb A1c MFr Bld 5.3 4.8 - 5.6 %   Mean Plasma Glucose 105.41 mg/dL  Wet prep, genital   Collection Time: 08/21/18  5:20 PM  Result Value Ref Range   Yeast Wet Prep HPF POC NONE SEEN NONE SEEN   Trich, Wet Prep NONE SEEN NONE SEEN   Clue Cells Wet Prep HPF POC NONE SEEN NONE SEEN   WBC, Wet Prep HPF  POC NONE SEEN NONE SEEN   Sperm NONE SEEN     Patient Active Problem List   Diagnosis Date Noted  . Hyperemesis gravidarum 08/21/2018  . Morbid obesity (HCC) 07/30/2018  . Subchorionic hemorrhage in first trimester 07/30/2018  . Nausea and vomiting 07/26/2018  . Generalized abdominal pain   . Epigastric abdominal pain affecting pregnancy in first trimester 07/25/2018  . Pregnancy with uncertain fetal viability 07/25/2018  . Herpes genitalis 02/21/2018  . Gonorrhea 03/29/2015  . Pelvic inflammatory disease 03/28/2015  . SIRS (systemic inflammatory response syndrome) (HCC) 03/28/2015  . Leukocytosis 03/28/2015  . Hypokalemia 03/28/2015    Assessment/Plan:  Angela Flynn is a 23 y.o. G1P0000 at [redacted]w[redacted]d here for nausea and vomiting in pregnancy for nearly one month. Given weight loss of over 37 lbs and uncontrollable nausea/vomiting, will admit for hyperemesis gravidarum. No electrolyte abnormalities. Has had ultrasound confirming IUP, no evidence of molar pregnancy. TSH low, free T4 pending so hyperthyroidism could be contributing to current symptoms. Has had extensive workup for abdominal pain including CT scan, RUQ U/S and empiric treatment for cervicitis.   Hyperemesis Gravidarum  -- NPO except sips with meds, ice chips -- mIVF LR + phenergan infusion ( /1L) -- Pepcid IV BID, Reglan IV prn -- thiamine IV x 3 doses -- f/u free T4 -- history of THC use, follow-up UDS -- f/u U/A + culture   Vaginal Discharge  -- wet prep negative  -- f/u G/C   Jamol Ginyard S. Earlene Plater, DO OB/GYN Fellow

## 2018-08-21 NOTE — MAU Note (Addendum)
Pt has not eaten for 2 weeks other than saltines. Is also having a white vaginal discharge. States she's taking all the medications she was prescribed.  Throat is burning and having abdominal pain.

## 2018-08-21 NOTE — ED Notes (Signed)
MAU notified  °

## 2018-08-22 DIAGNOSIS — Z1159 Encounter for screening for other viral diseases: Secondary | ICD-10-CM | POA: Diagnosis not present

## 2018-08-22 DIAGNOSIS — Z3A09 9 weeks gestation of pregnancy: Secondary | ICD-10-CM | POA: Diagnosis not present

## 2018-08-22 DIAGNOSIS — O210 Mild hyperemesis gravidarum: Secondary | ICD-10-CM | POA: Diagnosis present

## 2018-08-22 DIAGNOSIS — O99211 Obesity complicating pregnancy, first trimester: Secondary | ICD-10-CM | POA: Diagnosis present

## 2018-08-22 DIAGNOSIS — A6000 Herpesviral infection of urogenital system, unspecified: Secondary | ICD-10-CM | POA: Diagnosis present

## 2018-08-22 DIAGNOSIS — O208 Other hemorrhage in early pregnancy: Secondary | ICD-10-CM | POA: Diagnosis present

## 2018-08-22 DIAGNOSIS — Z87891 Personal history of nicotine dependence: Secondary | ICD-10-CM | POA: Diagnosis not present

## 2018-08-22 DIAGNOSIS — O98311 Other infections with a predominantly sexual mode of transmission complicating pregnancy, first trimester: Secondary | ICD-10-CM | POA: Diagnosis present

## 2018-08-22 LAB — TYPE AND SCREEN
ABO/RH(D): O POS
Antibody Screen: NEGATIVE

## 2018-08-22 LAB — CULTURE, OB URINE

## 2018-08-22 LAB — GC/CHLAMYDIA PROBE AMP (~~LOC~~) NOT AT ARMC
Chlamydia: NEGATIVE
Neisseria Gonorrhea: NEGATIVE

## 2018-08-22 LAB — ABO/RH: ABO/RH(D): O POS

## 2018-08-22 MED ORDER — PROMETHAZINE HCL 25 MG/ML IJ SOLN
25.0000 mg | Freq: Four times a day (QID) | INTRAMUSCULAR | Status: DC | PRN
  Administered 2018-08-23: 25 mg via INTRAVENOUS
  Filled 2018-08-22: qty 1

## 2018-08-22 MED ORDER — LACTATED RINGERS IV SOLN
INTRAVENOUS | Status: DC
  Administered 2018-08-22 – 2018-08-23 (×3): via INTRAVENOUS

## 2018-08-22 MED ORDER — FAMOTIDINE IN NACL 20-0.9 MG/50ML-% IV SOLN
20.0000 mg | Freq: Two times a day (BID) | INTRAVENOUS | Status: DC
  Filled 2018-08-22: qty 50

## 2018-08-22 MED ORDER — FAMOTIDINE IN NACL 20-0.9 MG/50ML-% IV SOLN
20.0000 mg | Freq: Two times a day (BID) | INTRAVENOUS | Status: DC
  Administered 2018-08-22 – 2018-08-23 (×2): 20 mg via INTRAVENOUS
  Filled 2018-08-22 (×3): qty 50

## 2018-08-22 NOTE — Progress Notes (Addendum)
FACULTY PRACTICE ANTEPARTUM PROGRESS NOTE  Angela Flynn is a 23 y.o. G1P0000 at [redacted]w[redacted]d who is admitted for hyperemesis.  Estimated Date of Delivery: 03/21/19  Length of Stay:  0 Days. Admitted 08/21/2018  Subjective:  Patient reports she is not feeling any better or worse. States she is hungry and asking to eat, states her hunger is making her nausea worse. Some cramping, denies leaking. Otherwise no issues.   Vitals:  Blood pressure (!) 114/41, pulse 82, temperature 97.9 F (36.6 C), temperature source Oral, resp. rate 17, height 5\' 9"  (1.753 m), weight 116.4 kg, last menstrual period 06/14/2018, SpO2 100 %. Physical Examination: CONSTITUTIONAL: Well-developed, well-nourished female in no acute distress.  HENT:  Normocephalic, atraumatic, External right and left ear normal. Oropharynx is clear and moist EYES: Conjunctivae and EOM are normal. Pupils are equal, round, and reactive to light. No scleral icterus.  NECK: Normal range of motion, supple, no masses. SKIN: Skin is warm and dry. No rash noted. Not diaphoretic. No erythema. No pallor. NEUROLGIC: Alert and oriented to person, place, and time. Normal reflexes, muscle tone coordination. No cranial nerve deficit noted. PSYCHIATRIC: Normal mood and affect. Normal behavior. Normal judgment and thought content. CARDIOVASCULAR: Normal heart rate noted RESPIRATORY: Effort normal, no problems with respiration noted MUSCULOSKELETAL: Normal range of motion. No edema and no tenderness. ABDOMEN: Soft, nontender, nondistended CERVIX: deferred  I have reviewed the patient's current medications.  ASSESSMENT: Active Problems:   Herpes genitalis   Subchorionic hemorrhage in first trimester   Hyperemesis gravidarum   PLAN: - Will start clears today at patient request - cont IV meds, will switch to PO as patient is tolerating Patient and mother concerned something is wrong with pregnancy, reviewed ultrasound was normal for singleton IUP.  Reviewed nausea very common in early pregnancy, that she is hungry this am is a good sign   Continue routine antenatal care.   Baldemar Lenis, M.D. Attending Center for [employer] (Faculty Practice)  08/22/2018 10:13 AM

## 2018-08-23 MED ORDER — METOCLOPRAMIDE HCL 10 MG PO TABS: 10.0000 mg | ORAL_TABLET | Freq: Four times a day (QID) | ORAL | 2 refills | Status: DC | PRN

## 2018-08-23 MED ORDER — PROMETHAZINE HCL 25 MG PO TABS: 25.0000 mg | ORAL_TABLET | Freq: Four times a day (QID) | ORAL | 2 refills | Status: DC | PRN

## 2018-08-23 NOTE — Discharge Summary (Signed)
Antenatal Physician Discharge Summary  Patient ID: Angela Flynn MRN: [id number] DOB/AGE: Aug 28, 1995 23 y.o.  Admit date: 08/21/2018 Discharge date: 08/23/2018  Admission Diagnoses: hyperemesis  Discharge Diagnoses:  Principal Problem:   Hyperemesis gravidarum Active Problems:   Herpes genitalis   Subchorionic hemorrhage in first trimester   Prenatal Procedures: none  Hospital Course:  This is a 23 y.o. G1P0000 with IUP at [redacted]w[redacted]d admitted for hyperemesis gravidarum. She improved on IVFs and by HD#3 was tolerating solid food. She was discharged home HD#3 in fair condition with PO anti-emetics. TSH elevated however T4 normal. Urine culture in process. Reviewed precautions, she feels comfortable for discharge.   She has follow up scheduled next week with [facility].  Discharge Exam: Temp:  [97.6 F (36.4 C)-98.2 F (36.8 C)] 98.1 F (36.7 C) (05/14 1532) Pulse Rate:  [78-98] 98 (05/14 1532) Resp:  [18-20] 20 (05/14 1532) BP: (86-126)/(31-63) 126/63 (05/14 1532) SpO2:  [100 %] 100 % (05/14 1532) Physical Examination: CONSTITUTIONAL: Well-developed, well-nourished female in no acute distress.  HENT:  Normocephalic, atraumatic, External right and left ear normal. Oropharynx is clear and moist EYES: Conjunctivae and EOM are normal. Pupils are equal, round, and reactive to light. No scleral icterus.  NECK: Normal range of motion, supple, no masses SKIN: Skin is warm and dry. No rash noted. Not diaphoretic. No erythema. No pallor. NEUROLGIC: Alert and oriented to person, place, and time. Normal reflexes, muscle tone coordination. No cranial nerve deficit noted. PSYCHIATRIC: Normal mood and affect. Normal behavior. Normal judgment and thought content. CARDIOVASCULAR: Normal heart rate noted, regular rhythm RESPIRATORY: Effort and breath sounds normal, no problems with respiration noted MUSCULOSKELETAL: Normal range of motion. No edema and no tenderness. 2+ distal pulses. ABDOMEN:  Soft, nontender, nondistended, gravid. CERVIX:    Fetal monitoring: FHR: 163 bpm  Significant Diagnostic Studies:  Results for orders placed or performed during the hospital encounter of 08/21/18 (from the past 168 hour(s))  GC/Chlamydia probe amp ([place])not at [facility]   Collection Time: 08/21/18 12:00 AM  Result Value Ref Range   Chlamydia Negative    Neisseria gonorrhea Negative   Comprehensive metabolic panel   Collection Time: 08/21/18  4:03 PM  Result Value Ref Range   Sodium 135 135 - 145 mmol/L   Potassium 3.9 3.5 - 5.1 mmol/L   Chloride 104 98 - 111 mmol/L   CO2 20 (L) 22 - 32 mmol/L   Glucose, Bld 101 (H) 70 - 99 mg/dL   BUN 7 6 - 20 mg/dL   Creatinine, Ser 4.09 0.44 - 1.00 mg/dL   Calcium 9.9 8.9 - 81.1 mg/dL   Total Protein 8.1 6.5 - 8.1 g/dL   Albumin 4.2 3.5 - 5.0 g/dL   AST 40 15 - 41 U/L   ALT 69 (H) 0 - 44 U/L   Alkaline Phosphatase 69 38 - 126 U/L   Total Bilirubin 1.0 0.3 - 1.2 mg/dL   GFR calc non Af Amer >60 >60 mL/min   GFR calc Af Amer >60 >60 mL/min   Anion gap 11 5 - 15  CBC with Differential/Platelet   Collection Time: 08/21/18  4:03 PM  Result Value Ref Range   WBC 10.8 (H) 4.0 - 10.5 K/uL   RBC 4.97 3.87 - 5.11 MIL/uL   Hemoglobin 15.1 (H) 12.0 - 15.0 g/dL   HCT 91.4 78.2 - 95.6 %   MCV 87.1 80.0 - 100.0 fL   MCH 30.4 26.0 - 34.0 pg   MCHC 34.9 30.0 -  36.0 g/dL   RDW 26.3 33.5 - 45.6 %   Platelets 287 150 - 400 K/uL   nRBC 0.0 0.0 - 0.2 %   Neutrophils Relative % 82 %   Neutro Abs 8.9 (H) 1.7 - 7.7 K/uL   Lymphocytes Relative 13 %   Lymphs Abs 1.4 0.7 - 4.0 K/uL   Monocytes Relative 5 %   Monocytes Absolute 0.5 0.1 - 1.0 K/uL   Eosinophils Relative 0 %   Eosinophils Absolute 0.0 0.0 - 0.5 K/uL   Basophils Relative 0 %   Basophils Absolute 0.0 0.0 - 0.1 K/uL   Immature Granulocytes 0 %   Abs Immature Granulocytes 0.04 0.00 - 0.07 K/uL  Lipase, blood   Collection Time: 08/21/18  4:03 PM  Result Value Ref Range   Lipase 23 11 - 51  U/L  TSH   Collection Time: 08/21/18  4:30 PM  Result Value Ref Range   TSH 0.065 (L) 0.350 - 4.500 uIU/mL  Hemoglobin A1c   Collection Time: 08/21/18  4:30 PM  Result Value Ref Range   Hgb A1c MFr Bld 5.3 4.8 - 5.6 %   Mean Plasma Glucose 105.41 mg/dL  Wet prep, genital   Collection Time: 08/21/18  5:20 PM  Result Value Ref Range   Yeast Wet Prep HPF POC NONE SEEN NONE SEEN   Trich, Wet Prep NONE SEEN NONE SEEN   Clue Cells Wet Prep HPF POC NONE SEEN NONE SEEN   WBC, Wet Prep HPF POC NONE SEEN NONE SEEN   Sperm NONE SEEN   T4, free   Collection Time: 08/21/18  6:30 PM  Result Value Ref Range   Free T4 1.00 0.82 - 1.77 ng/dL  SARS Coronavirus 2 (CEPHEID - Performed in [facility] hospital lab), Hosp Order   Collection Time: 08/21/18  6:57 PM  Result Value Ref Range   SARS Coronavirus 2 NEGATIVE NEGATIVE  Urine rapid drug screen (hosp performed)   Collection Time: 08/21/18  8:00 PM  Result Value Ref Range   Opiates NONE DETECTED NONE DETECTED   Cocaine NONE DETECTED NONE DETECTED   Benzodiazepines NONE DETECTED NONE DETECTED   Amphetamines NONE DETECTED NONE DETECTED   Tetrahydrocannabinol POSITIVE (A) NONE DETECTED   Barbiturates NONE DETECTED NONE DETECTED  OB Urine Culture   Collection Time: 08/21/18  8:08 PM  Result Value Ref Range   Specimen Description URINE, CLEAN CATCH    Special Requests NONE    Culture (A)     MULTIPLE SPECIES PRESENT, SUGGEST RECOLLECTION NO GROUP B STREP (S.AGALACTIAE) ISOLATED Performed at [facility], [address], [place], [place] [address]    Report Status 08/22/2018 FINAL   Urinalysis, Routine w reflex microscopic   Collection Time: 08/21/18  8:15 PM  Result Value Ref Range   Color, Urine AMBER (A) YELLOW   APPearance HAZY (A) CLEAR   Specific Gravity, Urine 1.030 1.005 - 1.030   pH 6.0 5.0 - 8.0   Glucose, UA NEGATIVE NEGATIVE mg/dL   Hgb urine dipstick NEGATIVE NEGATIVE   Bilirubin Urine NEGATIVE NEGATIVE   Ketones,  ur 80 (A) NEGATIVE mg/dL   Protein, ur 937 (A) NEGATIVE mg/dL   Nitrite NEGATIVE NEGATIVE   Leukocytes,Ua NEGATIVE NEGATIVE   RBC / HPF 0-5 0 - 5 RBC/hpf   WBC, UA 0-5 0 - 5 WBC/hpf   Bacteria, UA NONE SEEN NONE SEEN   Squamous Epithelial / LPF 6-10 0 - 5   Mucus PRESENT   Type and  screen [facility]   Collection Time: 08/22/18  5:02 AM  Result Value Ref Range   ABO/RH(D) O POS    Antibody Screen NEG    Sample Expiration      08/25/2018,2359 Performed at [facility], [address] [place], [place] [address]   ABO/Rh   Collection Time: 08/22/18  5:02 AM  Result Value Ref Range   ABO/RH(D) O POS    No rh immune globuloin      NOT A RH IMMUNE GLOBULIN CANDIDATE, PT RH POSITIVE Performed at [facility], [address] [place], [place] [address]     Discharge Condition: Stable  Disposition: Discharge disposition: 01-Home or Self Care         Allergies as of 08/23/2018   No Known Allergies     Medication List    STOP taking these medications   scopolamine 1 MG/3DAYS Commonly known as:  Transderm-Scop (1.5 MG)     TAKE these medications   acetaminophen 500 MG tablet Commonly known as:  TYLENOL Take 500 mg by mouth every 6 (six) hours as needed for mild pain or headache.   docusate sodium 100 MG capsule Commonly known as:  Colace Take 1 capsule (100 mg total) by mouth daily as needed.   metoCLOPramide 10 MG tablet Commonly known as:  REGLAN Take 1 tablet (10 mg total) by mouth 4 (four) times daily as needed for nausea or vomiting.   promethazine 25 MG tablet Commonly known as:  PHENERGAN Take 1 tablet (25 mg total) by mouth every 6 (six) hours as needed for nausea or vomiting. What changed:    medication strength  how much to take      Follow-up Information    [facility]. Go on 08/31/2018.   Specialty:  Obstetrics and Gynecology Contact information: [address],  [address] [id number] [place] [phone]          Signed: Baldemar LenisK. Meryl , M.D. Attending Center [facility] (Faculty Practice)  08/23/2018, 4:30 PM

## 2018-08-23 NOTE — Progress Notes (Signed)
Discharge instructions and prescriptions given to pt.  Discussed signs and symptoms to report to the MD, upcoming appointments, and meds. Pt has no questions or concerns at this time. Pt discharged from hospital in stable condition.

## 2018-08-23 NOTE — Discharge Instructions (Signed)
Hyperemesis Gravidarum  Hyperemesis gravidarum is a severe form of nausea and vomiting that happens during pregnancy. Hyperemesis is worse than morning sickness. It may cause you to have nausea or vomiting all day for many days. It may keep you from eating and drinking enough food and liquids, which can lead to dehydration, malnutrition, and weight loss. Hyperemesis usually occurs during the first half (the first 20 weeks) of pregnancy. It often goes away once a woman is in her second half of pregnancy. However, sometimes hyperemesis continues through an entire pregnancy.  What are the causes?  The cause of this condition is not known. It may be related to changes in chemicals (hormones) in the body during pregnancy, such as the high level of pregnancy hormone (human chorionic gonadotropin) or the increase in the female sex hormone (estrogen).  What are the signs or symptoms?  Symptoms of this condition include:  Nausea that does not go away.  Vomiting that does not allow you to keep any food down.  Weight loss.  Body fluid loss (dehydration).  Having no desire to eat, or not liking food that you have previously enjoyed.  How is this diagnosed?  This condition may be diagnosed based on:  A physical exam.  Your medical history.  Your symptoms.  Blood tests.  Urine tests.  How is this treated?  This condition is managed by controlling symptoms. This may include:  Following an eating plan. This can help lessen nausea and vomiting.  Taking prescription medicines.  An eating plan and medicines are often used together to help control symptoms. If medicines do not help relieve nausea and vomiting, you may need to receive fluids through an IV at the hospital.  Follow these instructions at home:  Eating and drinking    Avoid the following:  Drinking fluids with meals. Try not to drink anything during the 30 minutes before and after your meals.  Drinking more than 1 cup of fluid at a time.  Eating foods that trigger your  symptoms. These may include spicy foods, coffee, high-fat foods, very sweet foods, and acidic foods.  Skipping meals. Nausea can be more intense on an empty stomach. If you cannot tolerate food, do not force it. Try sucking on ice chips or other frozen items and make up for missed calories later.  Lying down within 2 hours after eating.  Being exposed to environmental triggers. These may include food smells, smoky rooms, closed spaces, rooms with strong smells, warm or humid places, overly loud and noisy rooms, and rooms with motion or flickering lights. Try eating meals in a well-ventilated area that is free of strong smells.  Quick and sudden changes in your movement.  Taking iron pills and multivitamins that contain iron. If you take prescription iron pills, do not stop taking them unless your health care provider approves.  Preparing food. The smell of food can spoil your appetite or trigger nausea.  To help relieve your symptoms:  Listen to your body. Everyone is different and has different preferences. Find what works best for you.  Eat and drink slowly.  Eat 5-6 small meals daily instead of 3 large meals. Eating small meals and snacks can help you avoid an empty stomach.  In the morning, before getting out of bed, eat a couple of crackers to avoid moving around on an empty stomach.  Try eating starchy foods as these are usually tolerated well. Examples include cereal, toast, bread, potatoes, pasta, rice, and pretzels.  Include at   least 1 serving of protein with your meals and snacks. Protein options include lean meats, poultry, seafood, beans, nuts, nut butters, eggs, cheese, and yogurt.  Try eating a protein-rich snack before bed. Examples of a protein-rick snack include cheese and crackers or a peanut butter sandwich made with 1 slice of whole-wheat bread and 1 tsp (5 g) of peanut butter.  Eat or suck on things that have ginger in them. It may help relieve nausea. Add  tsp ground ginger to hot tea or  choose ginger tea.  Try drinking 100% fruit juice or an electrolyte drink. An electrolyte drink contains sodium, potassium, and chloride.  Drink fluids that are cold, clear, and carbonated or sour. Examples include lemonade, ginger ale, lemon-lime soda, ice water, and sparkling water.  Brush your teeth or use a mouth rinse after meals.  Talk with your health care provider about starting a supplement of vitamin B6.  General instructions  Take over-the-counter and prescription medicines only as told by your health care provider.  Follow instructions from your health care provider about eating or drinking restrictions.  Continue to take your prenatal vitamins as told by your health care provider. If you are having trouble taking your prenatal vitamins, talk with your health care provider about different options.  Keep all follow-up and pre-birth (prenatal) visits as told by your health care provider. This is important.  Contact a health care provider if:  You have pain in your abdomen.  You have a severe headache.  You have vision problems.  You are losing weight.  You feel weak or dizzy.  Get help right away if:  You cannot drink fluids without vomiting.  You vomit blood.  You have constant nausea and vomiting.  You are very weak.  You faint.  You have a fever and your symptoms suddenly get worse.  Summary  Hyperemesis gravidarum is a severe form of nausea and vomiting that happens during pregnancy.  Making some changes to your eating habits may help relieve nausea and vomiting.  This condition may be managed with medicine.  If medicines do not help relieve nausea and vomiting, you may need to receive fluids through an IV at the hospital.  This information is not intended to replace advice given to you by your health care provider. Make sure you discuss any questions you have with your health care provider.  Document Released: 03/28/2005 Document Revised: 04/17/2017 Document Reviewed: 11/25/2015  [employer] Interactive  Patient Education  2019 [employer]

## 2018-08-23 NOTE — Plan of Care (Signed)

## 2018-08-23 NOTE — Progress Notes (Signed)
FACULTY PRACTICE ANTEPARTUM PROGRESS NOTE  Angela Flynn is a 23 y.o. G1P0000 at [redacted]w[redacted]d who is admitted for hyperemesis.  Estimated Date of Delivery: 03/21/19  Length of Stay:  1 Days. Admitted 08/21/2018  Subjective:  Patient feeling better today, reports nausea under good control with phenergan. Tolerating clear liquids, would like to try solids. Still with some abdominal pain, denies other complaints.  Vitals:  Blood pressure (!) 107/44, pulse 84, temperature 97.9 F (36.6 C), temperature source Oral, resp. rate 18, height  (1.753 m), weight 116.4 kg, last menstrual period 06/14/2018, SpO2 100 %. Physical Examination: CONSTITUTIONAL: Well-developed, well-nourished female in no acute distress.  HENT:  Normocephalic, atraumatic, External right and left ear normal. Oropharynx is clear and moist EYES: Conjunctivae and EOM are normal. Pupils are equal, round, and reactive to light. No scleral icterus.  NECK: Normal range of motion, supple, no masses. SKIN: Skin is warm and dry. No rash noted. Not diaphoretic. No erythema. No pallor. NEUROLGIC: Alert and oriented to person, place, and time. Normal reflexes, muscle tone coordination. No cranial nerve deficit noted. PSYCHIATRIC: Normal mood and affect. Normal behavior. Normal judgment and thought content. CARDIOVASCULAR: Normal heart rate noted RESPIRATORY: Effort normal, no problems with respiration noted MUSCULOSKELETAL: Normal range of motion. No edema and no tenderness. ABDOMEN: Soft, nontender, nondistended CERVIX: deferred  Results for orders placed or performed during the hospital encounter of 08/21/18 (from the past 48 hour(s))  Comprehensive metabolic panel     Status: Abnormal   Collection Time: 08/21/18  4:03 PM  Result Value Ref Range   Sodium 135 135 - 145 mmol/L   Potassium 3.9 3.5 - 5.1 mmol/L   Chloride 104 98 - 111 mmol/L   CO2 20 (L) 22 - 32 mmol/L   Glucose, Bld 101 (H) 70 - 99 mg/dL   BUN 7 6 - 20 mg/dL    Creatinine, Ser 8.29 0.44 - 1.00 mg/dL   Calcium 9.9 8.9 - 56.2 mg/dL   Total Protein 8.1 6.5 - 8.1 g/dL   Albumin 4.2 3.5 - 5.0 g/dL   AST 40 15 - 41 U/L   ALT 69 (H) 0 - 44 U/L   Alkaline Phosphatase 69 38 - 126 U/L   Total Bilirubin 1.0 0.3 - 1.2 mg/dL   GFR calc non Af Amer >60 >60 mL/min   GFR calc Af Amer >60 >60 mL/min   Anion gap 11 5 - 15    Comment: Performed at [facility], [address] [place], [place] [address]  CBC with Differential/Platelet     Status: Abnormal   Collection Time: 08/21/18  4:03 PM  Result Value Ref Range   WBC 10.8 (H) 4.0 - 10.5 K/uL   RBC 4.97 3.87 - 5.11 MIL/uL   Hemoglobin 15.1 (H) 12.0 - 15.0 g/dL   HCT 57.8 46.9 - 62.9 %   MCV 87.1 80.0 - 100.0 fL   MCH 30.4 26.0 - 34.0 pg   MCHC 34.9 30.0 - 36.0 g/dL   RDW 52.8 41.3 - 24.4 %   Platelets 287 150 - 400 K/uL   nRBC 0.0 0.0 - 0.2 %   Neutrophils Relative % 82 %   Neutro Abs 8.9 (H) 1.7 - 7.7 K/uL   Lymphocytes Relative 13 %   Lymphs Abs 1.4 0.7 - 4.0 K/uL   Monocytes Relative 5 %   Monocytes Absolute 0.5 0.1 - 1.0 K/uL   Eosinophils Relative 0 %   Eosinophils Absolute 0.0 0.0 - 0.5  K/uL   Basophils Relative 0 %   Basophils Absolute 0.0 0.0 - 0.1 K/uL   Immature Granulocytes 0 %   Abs Immature Granulocytes 0.04 0.00 - 0.07 K/uL    Comment: Performed at [facility], [address] [place], [place] [address]  Lipase, blood     Status: None   Collection Time: 08/21/18  4:03 PM  Result Value Ref Range   Lipase 23 11 - 51 U/L    Comment: Performed at [facility], [address] [place], [place] [address]  TSH     Status: Abnormal   Collection Time: 08/21/18  4:30 PM  Result Value Ref Range   TSH 0.065 (L) 0.350 - 4.500 uIU/mL    Comment: Performed by a 3rd Generation assay with a functional sensitivity of <=0.01 uIU/mL. Performed at [facility], [address] [place], [place] [address]   Hemoglobin A1c     Status: None   Collection Time: 08/21/18  4:30  PM  Result Value Ref Range   Hgb A1c MFr Bld 5.3 4.8 - 5.6 %    Comment: (NOTE) Pre diabetes:          5.7%-6.4% Diabetes:              >6.4% Glycemic control for   <7.0% adults with diabetes    Mean Plasma Glucose 105.41 mg/dL    Comment: Performed at [facility], [address] [place], [place] [address]  Wet prep, genital     Status: None   Collection Time: 08/21/18  5:20 PM  Result Value Ref Range   Yeast Wet Prep HPF POC NONE SEEN NONE SEEN    Comment: Specimen diluted due to transport tube containing more than 1 ml of saline, interpret results with caution.   Trich, Wet Prep NONE SEEN NONE SEEN    Comment: Specimen diluted due to transport tube containing more than 1 ml of saline, interpret results with caution.   Clue Cells Wet Prep HPF POC NONE SEEN NONE SEEN    Comment: Specimen diluted due to transport tube containing more than 1 ml of saline, interpret results with caution.   WBC, Wet Prep HPF POC NONE SEEN NONE SEEN    Comment: Specimen diluted due to transport tube containing more than 1 ml of saline, interpret results with caution.   Sperm NONE SEEN     Comment: Specimen diluted due to transport tube containing more than 1 ml of saline, interpret results with caution. Performed at [facility], [address] [place], [place] [address]   T4, free     Status: None   Collection Time: 08/21/18  6:30 PM  Result Value Ref Range   Free T4 1.00 0.82 - 1.77 ng/dL    Comment: (NOTE) Biotin ingestion may interfere with free T4 tests. If the results are inconsistent with the TSH level, previous test results, or the clinical presentation, then consider biotin interference. If needed, order repeat testing after stopping biotin. Performed at [facility], [address] [place], [place] [address]   SARS Coronavirus 2 (CEPHEID - Performed in [facility] hospital lab), Hosp Order     Status: None   Collection Time: 08/21/18  6:57 PM  Result Value Ref Range    SARS Coronavirus 2 NEGATIVE NEGATIVE    Comment: (NOTE) If result is NEGATIVE SARS-CoV-2 target nucleic acids are NOT DETECTED. The SARS-CoV-2 RNA is generally detectable in upper and  lower  respiratory specimens during the acute phase of infection. The lowest  concentration of SARS-CoV-2 viral copies this assay can detect is 250  copies / mL. A negative result does not preclude SARS-CoV-2 infection  and should not be used as the sole basis for treatment or other  patient management decisions.  A negative result may occur with  improper specimen collection / handling, submission of specimen other  than nasopharyngeal swab, presence of viral mutation(s) within the  areas targeted by this assay, and inadequate number of viral copies  (<250 copies / mL). A negative result must be combined with clinical  observations, patient history, and epidemiological information. If result is POSITIVE SARS-CoV-2 target nucleic acids are DETECTED. The SARS-CoV-2 RNA is generally detectable in upper and lower  respiratory specimens dur ing the acute phase of infection.  Positive  results are indicative of active infection with SARS-CoV-2.  Clinical  correlation with patient history and other diagnostic information is  necessary to determine patient infection status.  Positive results do  not rule out bacterial infection or co-infection with other viruses. If result is PRESUMPTIVE POSTIVE SARS-CoV-2 nucleic acids MAY BE PRESENT.   A presumptive positive result was obtained on the submitted specimen  and confirmed on repeat testing.  While 2019 novel coronavirus  (SARS-CoV-2) nucleic acids may be present in the submitted sample  additional confirmatory testing may be necessary for epidemiological  and / or clinical management purposes  to differentiate between  SARS-CoV-2 and other Sarbecovirus currently known to infect humans.  If clinically indicated additional testing with an alternate test   methodology [id number]) is advised. The SARS-CoV-2 RNA is generally  detectable in upper and lower respiratory sp ecimens during the acute  phase of infection. The expected result is Negative. Fact Sheet for Patients:  BoilerBrush.com.cy Fact Sheet for Healthcare Providers: https://pope.com/ This test is not yet approved or cleared by the [place] FDA and has been authorized for detection and/or diagnosis of SARS-CoV-2 by FDA under an Emergency Use Authorization (EUA).  This EUA will remain in effect (meaning this test can be used) for the duration of the COVID-19 declaration under Section 564(b)(1) of the Act, 21 U.S.C. section 360bbb-3(b)(1), unless the authorization is terminated or revoked sooner. Performed at [facility], [address], [place], [place] [address]   Urine rapid drug screen (hosp performed)     Status: Abnormal   Collection Time: 08/21/18  8:00 PM  Result Value Ref Range   Opiates NONE DETECTED NONE DETECTED   Cocaine NONE DETECTED NONE DETECTED   Benzodiazepines NONE DETECTED NONE DETECTED   Amphetamines NONE DETECTED NONE DETECTED   Tetrahydrocannabinol POSITIVE (A) NONE DETECTED   Barbiturates NONE DETECTED NONE DETECTED    Comment: (NOTE) DRUG SCREEN FOR MEDICAL PURPOSES ONLY.  IF CONFIRMATION IS NEEDED FOR ANY PURPOSE, NOTIFY LAB WITHIN 5 DAYS. LOWEST DETECTABLE LIMITS FOR URINE DRUG SCREEN Drug Class                     Cutoff (ng/mL) Amphetamine and metabolites    1000 Barbiturate and metabolites    200 Benzodiazepine                 200 Tricyclics and metabolites     300 Opiates and metabolites        300 Cocaine and metabolites        300 THC  50 Performed at [facility], [address] [place], [place] [address]   OB Urine Culture     Status: Abnormal   Collection Time: 08/21/18  8:08 PM  Result Value Ref Range   Specimen Description URINE, CLEAN  CATCH    Special Requests NONE    Culture (A)     MULTIPLE SPECIES PRESENT, SUGGEST RECOLLECTION NO GROUP B STREP (S.AGALACTIAE) ISOLATED Performed at [facility], [address], [place], [place] [address]    Report Status 08/22/2018 FINAL   Urinalysis, Routine w reflex microscopic     Status: Abnormal   Collection Time: 08/21/18  8:15 PM  Result Value Ref Range   Color, Urine AMBER (A) YELLOW    Comment: BIOCHEMICALS MAY BE AFFECTED BY COLOR   APPearance HAZY (A) CLEAR   Specific Gravity, Urine 1.030 1.005 - 1.030   pH 6.0 5.0 - 8.0   Glucose, UA NEGATIVE NEGATIVE mg/dL   Hgb urine dipstick NEGATIVE NEGATIVE   Bilirubin Urine NEGATIVE NEGATIVE   Ketones, ur 80 (A) NEGATIVE mg/dL   Protein, ur 962 (A) NEGATIVE mg/dL   Nitrite NEGATIVE NEGATIVE   Leukocytes,Ua NEGATIVE NEGATIVE   RBC / HPF 0-5 0 - 5 RBC/hpf   WBC, UA 0-5 0 - 5 WBC/hpf   Bacteria, UA NONE SEEN NONE SEEN   Squamous Epithelial / LPF 6-10 0 - 5   Mucus PRESENT     Comment: Performed at [facility], [address], [place], [place] [address]  Type and screen [facility]     Status: None   Collection Time: 08/22/18  5:02 AM  Result Value Ref Range   ABO/RH(D) O POS    Antibody Screen NEG    Sample Expiration      08/25/2018,2359 Performed at [facility], [address], [place], [place] [address]   ABO/Rh     Status: None   Collection Time: 08/22/18  5:02 AM  Result Value Ref Range   ABO/RH(D) O POS    No rh immune globuloin      NOT A RH IMMUNE GLOBULIN CANDIDATE, PT RH POSITIVE Performed at [facility], [address], [place], [place] [address]     I have reviewed the patient's current medications.  ASSESSMENT: Active Problems:   Herpes genitalis   Subchorionic hemorrhage in first trimester   Hyperemesis gravidarum   PLAN: - patient appears much improved today, states she is feeling much better - advance to solids this afternoon - possible discharge  later today if tolerating solids - reviewed expectations for course, that she will not be completely nausea free but if she is tolerating solids and liquids, then she will be discharged home with appropriate meds, she verbalizes understanding and is in agreement with plan - Low TSH -> T4 wnl - recollect urine culture   Continue routine antenatal care.   Baldemar Lenis, M.D. Attending [facility] [employer] (Faculty Practice)  08/23/2018 2:05 PM

## 2018-08-24 LAB — CULTURE, OB URINE

## 2018-08-29 ENCOUNTER — Telehealth: Payer: Self-pay | Admitting: Family Medicine

## 2018-08-29 NOTE — Telephone Encounter (Signed)
Called patient to inform her of needing the MyChart app before the visit.

## 2018-08-31 ENCOUNTER — Telehealth (INDEPENDENT_AMBULATORY_CARE_PROVIDER_SITE_OTHER): Payer: Medicaid Other | Admitting: *Deleted

## 2018-08-31 ENCOUNTER — Other Ambulatory Visit: Payer: Self-pay

## 2018-08-31 DIAGNOSIS — Z3490 Encounter for supervision of normal pregnancy, unspecified, unspecified trimester: Secondary | ICD-10-CM | POA: Insufficient documentation

## 2018-08-31 DIAGNOSIS — A6920 Lyme disease, unspecified: Secondary | ICD-10-CM

## 2018-08-31 MED ORDER — PRENATAL PLUS 27-1 MG PO TABS: 1.0000 | ORAL_TABLET | Freq: Every day | ORAL | 12 refills | Status: DC

## 2018-08-31 MED ORDER — AMBULATORY NON FORMULARY MEDICATION: 1.0000 | 0 refills | Status: DC

## 2018-08-31 MED ORDER — CEFUROXIME AXETIL 500 MG PO TABS: 500.0000 mg | ORAL_TABLET | Freq: Two times a day (BID) | ORAL | 0 refills | Status: DC

## 2018-08-31 NOTE — Progress Notes (Signed)
I have reviewed the chart and agree with nursing staff's documentation of this patient's encounter.  Jaynie Collins, MD 08/31/2018 1:26 PM

## 2018-08-31 NOTE — Progress Notes (Signed)
I connected with  [name] on 08/31/18 at  8:15 AM EDT by telephone and MyChart and verified that I am speaking with the correct person using two identifiers.   I discussed the limitations, risks, security and privacy concerns of performing an evaluation and management service by telephone and the availability of in person appointments. I also discussed with the patient that there may be a patient responsible charge related to this service. The patient expressed understanding and agreed to proceed.  Osvaldo Human, RN 08/31/2018  8:13 AM   Pt states she was bitten by a tick 4 days ago on her abdomen and she has since developed a red spot that has gotten bigger and developed a red center spot with an outer ring.  Reviewed with Dr. Macon Large and pt prescribed antibiotics to take BID for 14 days.  Pt verbalized understanding of this.  Pt prescribed a prenatal per her request and per protocol.  Babyscripts order placed and pt educated in how to [employer], museum/gallery.  BP cuff order placed and message sent to front office staff to fax the prescription to the pharmacy and advised pt that it can take a week to be delivered to her house.  Verified pt's address.  Instructed pt to bring her BP cuff to her first OB visit so that she could be educated in it's use.  Pt verbalized understanding.    Pt states she is experiencing some swelling in her feet.  Advised pt that some swelling is normal in pregnancy but that if it is dramatic, sudden swelling, that she should contact the office.  Pt verbalized understanding.

## 2018-09-18 ENCOUNTER — Encounter: Payer: Medicaid Other | Admitting: Internal Medicine

## 2018-09-27 ENCOUNTER — Telehealth: Payer: Self-pay | Admitting: *Deleted

## 2018-09-27 NOTE — Telephone Encounter (Signed)
Called pt regarding babyscripts.  Per review, pt has never logged a blood pressure into the app.  Chart review shows that a BP cuff was ordered on 08/31/18.  Pt did not pick up.  Left voicemail advising pt that she was being contacted regarding babyscripts and requesting she contact the clinic.  Mychart message sent.

## 2018-10-04 ENCOUNTER — Encounter: Payer: Self-pay | Admitting: Family Medicine

## 2018-10-04 ENCOUNTER — Encounter: Payer: Medicaid Other | Admitting: Family Medicine

## 2018-10-04 NOTE — Progress Notes (Signed)
Patient did not keep appointment today. She will be called to reschedule.  

## 2018-10-10 ENCOUNTER — Telehealth: Payer: Self-pay | Admitting: Family Medicine

## 2018-10-10 NOTE — Telephone Encounter (Signed)
Called and spoke to the patient, she is getting her Care at another office.

## 2018-10-28 ENCOUNTER — Other Ambulatory Visit: Payer: Self-pay

## 2018-10-28 ENCOUNTER — Emergency Department
Admission: EM | Admit: 2018-10-28 | Discharge: 2018-10-28 | Disposition: A | Discharge status: Home / Self Care | Payer: Medicaid Other | Attending: Emergency Medicine | Admitting: Emergency Medicine

## 2018-10-28 ENCOUNTER — Emergency Department: Payer: Medicaid Other

## 2018-10-28 DIAGNOSIS — Z87891 Personal history of nicotine dependence: Secondary | ICD-10-CM | POA: Insufficient documentation

## 2018-10-28 DIAGNOSIS — Z79899 Other long term (current) drug therapy: Secondary | ICD-10-CM | POA: Insufficient documentation

## 2018-10-28 DIAGNOSIS — O219 Vomiting of pregnancy, unspecified: Secondary | ICD-10-CM | POA: Insufficient documentation

## 2018-10-28 DIAGNOSIS — R1010 Upper abdominal pain, unspecified: Secondary | ICD-10-CM | POA: Diagnosis not present

## 2018-10-28 DIAGNOSIS — O23592 Infection of other part of genital tract in pregnancy, second trimester: Secondary | ICD-10-CM | POA: Insufficient documentation

## 2018-10-28 DIAGNOSIS — O26892 Other specified pregnancy related conditions, second trimester: Secondary | ICD-10-CM | POA: Diagnosis not present

## 2018-10-28 DIAGNOSIS — R1030 Lower abdominal pain, unspecified: Secondary | ICD-10-CM

## 2018-10-28 DIAGNOSIS — Z3A19 19 weeks gestation of pregnancy: Secondary | ICD-10-CM | POA: Insufficient documentation

## 2018-10-28 DIAGNOSIS — R109 Unspecified abdominal pain: Secondary | ICD-10-CM

## 2018-10-28 LAB — CBC WITH DIFFERENTIAL/PLATELET
Abs Immature Granulocytes: 0.06 10*3/uL (ref 0.00–0.07)
Basophils Absolute: 0 10*3/uL (ref 0.0–0.1)
Basophils Relative: 0 %
Eosinophils Absolute: 0.1 10*3/uL (ref 0.0–0.5)
Eosinophils Relative: 1 %
HCT: 34.4 % — ABNORMAL LOW (ref 36.0–46.0)
Hemoglobin: 11.7 g/dL — ABNORMAL LOW (ref 12.0–15.0)
Immature Granulocytes: 0 %
Lymphocytes Relative: 16 %
Lymphs Abs: 2.3 10*3/uL (ref 0.7–4.0)
MCH: 31 pg (ref 26.0–34.0)
MCHC: 34 g/dL (ref 30.0–36.0)
MCV: 91 fL (ref 80.0–100.0)
Monocytes Absolute: 0.8 10*3/uL (ref 0.1–1.0)
Monocytes Relative: 5 %
Neutro Abs: 11.4 10*3/uL — ABNORMAL HIGH (ref 1.7–7.7)
Neutrophils Relative %: 78 %
Platelets: 287 10*3/uL (ref 150–400)
RBC: 3.78 MIL/uL — ABNORMAL LOW (ref 3.87–5.11)
RDW: 12.6 % (ref 11.5–15.5)
WBC: 14.7 10*3/uL — ABNORMAL HIGH (ref 4.0–10.5)
nRBC: 0 % (ref 0.0–0.2)

## 2018-10-28 LAB — URINALYSIS, COMPLETE (UACMP) WITH MICROSCOPIC
Bilirubin Urine: NEGATIVE
Glucose, UA: NEGATIVE mg/dL
Ketones, ur: NEGATIVE mg/dL
Leukocytes,Ua: NEGATIVE
Nitrite: NEGATIVE
Protein, ur: NEGATIVE mg/dL
Specific Gravity, Urine: 1.008 (ref 1.005–1.030)
pH: 6 (ref 5.0–8.0)

## 2018-10-28 LAB — COMPREHENSIVE METABOLIC PANEL
ALT: 11 U/L (ref 0–44)
AST: 15 U/L (ref 15–41)
Albumin: 3.3 g/dL — ABNORMAL LOW (ref 3.5–5.0)
Alkaline Phosphatase: 60 U/L (ref 38–126)
Anion gap: 10 (ref 5–15)
BUN: 11 mg/dL (ref 6–20)
CO2: 20 mmol/L — ABNORMAL LOW (ref 22–32)
Calcium: 9 mg/dL (ref 8.9–10.3)
Chloride: 108 mmol/L (ref 98–111)
Creatinine, Ser: 0.65 mg/dL (ref 0.44–1.00)
GFR calc Af Amer: >60 mL/min (ref >60)
GFR calc non Af Amer: >60 mL/min (ref >60)
Glucose, Bld: 109 mg/dL — ABNORMAL HIGH (ref 70–99)
Potassium: 3.6 mmol/L (ref 3.5–5.1)
Sodium: 138 mmol/L (ref 135–145)
Total Bilirubin: 0.5 mg/dL (ref 0.3–1.2)
Total Protein: 6.7 g/dL (ref 6.5–8.1)

## 2018-10-28 LAB — LIPASE, BLOOD: Lipase: 19 U/L (ref 11–51)

## 2018-10-28 LAB — WET PREP, GENITAL
Clue Cells Wet Prep HPF POC: NONE SEEN
Sperm: NONE SEEN
Trich, Wet Prep: NONE SEEN
Yeast Wet Prep HPF POC: NONE SEEN

## 2018-10-28 LAB — CHLAMYDIA/NGC RT PCR (ARMC ONLY)
Chlamydia Tr: NOT DETECTED
N gonorrhoeae: NOT DETECTED

## 2018-10-28 MED ORDER — SODIUM CHLORIDE 0.9 % IV BOLUS
1000.0000 mL | Freq: Once | INTRAVENOUS | Status: AC
  Administered 2018-10-28: 1000 mL via INTRAVENOUS

## 2018-10-28 MED ORDER — FENTANYL CITRATE (PF) 100 MCG/2ML IJ SOLN
50.0000 ug | Freq: Once | INTRAMUSCULAR | Status: AC
  Administered 2018-10-28: 50 ug via INTRAVENOUS
  Filled 2018-10-28: qty 2

## 2018-10-28 MED ORDER — ONDANSETRON HCL 4 MG/2ML IJ SOLN
4.0000 mg | Freq: Once | INTRAMUSCULAR | Status: AC
  Administered 2018-10-28: 4 mg via INTRAVENOUS
  Filled 2018-10-28: qty 2

## 2018-10-28 NOTE — ED Provider Notes (Addendum)
Procedures  Clinical Course as of Oct 27 1156  Sun Oct 28, 2018  0655 Labs, UA, [place] pending/ Care transferred to Dr. Joni Fears.   [CV]  1004 Labs show mild leukocytosis.  Otherwise unremarkable.  Pelvic ultrasound is unremarkable and wet prep and GC chlamydia are negative.  Awaiting urinalysis.   [PS]  1154 Urinalysis negative.  Vital signs remain normal.  Leukocytosis appears to be pain related.  Plan to discharge home to follow-up with OB.   [PS]    Clinical Course User Index [CV] Rudene Re, MD [PS] Carrie Mew, MD      Carrie Mew, MD 10/28/18 [place]    Carrie Mew, MD 10/31/18 [phone]

## 2018-10-28 NOTE — ED Notes (Signed)
Report to teresa, rn.  

## 2018-10-28 NOTE — ED Notes (Signed)
Signature pad broken - pt signed paper copy of discharge 

## 2018-10-28 NOTE — ED Triage Notes (Addendum)
Pt is G1P0 due 03/21/2019 ([redacted]w[redacted]d); pt says she's had abd pain since the middle of the night-pain to upper abd, lower abd and back; denies bleeding; denies movement since pain started; pt restless and continually moaning

## 2018-10-28 NOTE — ED Provider Notes (Signed)
[facility] Emergency Department Provider Note  ____________________________________________  Time seen: Approximately 6:20 AM  I have reviewed the triage vital signs and the nursing notes.   HISTORY  Chief Complaint Abdominal Pain   HPI Angela Flynn is a 23 y.o. female G1, P0 currently at 8673w3d gestational age who presents for evaluation of abdominal pain.  Patient reports sudden onset of sharp severe abdominal pain that is diffuse in her abdomen but worse in the upper abdomen radiating to her back associated with nausea and nonbloody nonbilious emesis.  Last BM this am and normal. Patient has had heavy vaginal discharge over the last several days.  No vaginal bleeding, no diarrhea, no fever or chills, no cough, chest pain, shortness of breath.   Past Medical History:  Diagnosis Date  . Allergy    seasonal, dusts  . Herpes genitalis   . MRSA infection    abscesses - history of    Patient Active Problem List   Diagnosis Date Noted  . Supervision of low-risk pregnancy 08/31/2018  . Erythema migrans (Lyme disease) 08/31/2018  . Hyperemesis gravidarum 08/21/2018  . Morbid obesity (HCC) 07/30/2018  . Subchorionic hemorrhage in first trimester 07/30/2018  . Nausea and vomiting 07/26/2018  . Generalized abdominal pain   . Epigastric abdominal pain affecting pregnancy in first trimester 07/25/2018  . Herpes genitalis 02/21/2018  . Gonorrhea 03/29/2015  . Pelvic inflammatory disease 03/28/2015  . SIRS (systemic inflammatory response syndrome) (HCC) 03/28/2015  . Leukocytosis 03/28/2015  . Hypokalemia 03/28/2015    Past Surgical History:  Procedure Laterality Date  . CHOLECYSTECTOMY, LAPAROSCOPIC    . TONSILLECTOMY      Prior to Admission medications   Medication Sig Start Date End Date Taking? Authorizing Provider  AMBULATORY NON FORMULARY MEDICATION 1 Device by Other route once a week. Blood Pressure Cuff Medium Monitored Regularly at  home ICD 10:Z34.90 08/31/18   Anyanwu, Jethro BastosUgonna A, MD  cefUROXime (CEFTIN) 500 MG tablet Take 1 tablet (500 mg total) by mouth 2 (two) times daily with a meal. 08/31/18   Anyanwu, Jethro BastosUgonna A, MD  metoCLOPramide (REGLAN) 10 MG tablet Take 1 tablet (10 mg total) by mouth 4 (four) times daily as needed for nausea or vomiting. 08/23/18   Conan Bowensavis, Kelly M, MD  prenatal vitamin w/FE, FA (PRENATAL 1 + 1) 27-1 MG TABS tablet Take 1 tablet by mouth daily at 12 noon. 08/31/18   Anyanwu, Jethro BastosUgonna A, MD  promethazine (PHENERGAN) 25 MG tablet Take 1 tablet (25 mg total) by mouth every 6 (six) hours as needed for nausea or vomiting. 08/23/18   Conan Bowensavis, Kelly M, MD  valACYclovir HCl (VALTREX PO) Take by mouth.    [provider]    Allergies Patient has no known allergies.  Family History  Problem Relation Age of Onset  . Bipolar disorder Mother   . Heart disease Mother   . Hypertension Sister   . Diabetes Maternal Grandmother   . Hypertension Maternal Grandmother     Social History Social History   Tobacco Use  . Smoking status: Former Smoker    Types: Cigars  . Smokeless tobacco: Never Used  . Tobacco comment: 2-5 cigars per day on average, which pt uses to smoke marijuana  Substance Use Topics  . Alcohol use: Not Currently  . Drug use: Not Currently    Types: Marijuana    Comment: 2-5 x per day    Review of Systems  Constitutional: Negative for fever. Eyes: Negative for visual  changes. ENT: Negative for sore throat. Neck: No neck pain  Cardiovascular: Negative for chest pain. Respiratory: Negative for shortness of breath. Gastrointestinal: + abdominal pain, nausea, and vomiting. No diarrhea. Genitourinary: Negative for dysuria. + vaginal discharge Musculoskeletal: Negative for back pain. Skin: Negative for rash. Neurological: Negative for headaches, weakness or numbness. Psych: No SI or HI  ____________________________________________   PHYSICAL EXAM:  VITAL SIGNS: ED Triage  Vitals  Enc Vitals Group     BP 10/28/18 0600 (!) 107/46     Pulse Rate 10/28/18 0600 (!) 112     Resp 10/28/18 0600 (!) 24     Temp 10/28/18 0600 98.9 F (37.2 C)     Temp Source 10/28/18 0600 Oral     SpO2 10/28/18 0600 97 %     Weight 10/28/18 0558 280 lb (127 kg)     Height 10/28/18 0558 5\' 9"  (1.753 m)     Head Circumference --      Peak Flow --      Pain Score 10/28/18 0558 10     Pain Loc --      Pain Edu? --      Excl. in GC? --     Constitutional: Alert and oriented, moaning and in significant amount of pain. HEENT:      Head: Normocephalic and atraumatic.         Eyes: Conjunctivae are normal. Sclera is non-icteric.       Mouth/Throat: Mucous membranes are moist.       Neck: Supple with no signs of meningismus. Cardiovascular: Tachycardic with regular rate and rhythm. No murmurs, gallops, or rubs. 2+ symmetrical distal pulses are present in all extremities. No JVD. Respiratory: Normal respiratory effort. Lungs are clear to auscultation bilaterally. No wheezes, crackles, or rhonchi.  Gastrointestinal: Obese, diffusely tender to palpation, with positive bowel sounds. No rebound or guarding. Genitourinary: No CVA tenderness. Musculoskeletal: Nontender with normal range of motion in all extremities. No edema, cyanosis, or erythema of extremities. Neurologic: Normal speech and language. Face is symmetric. Moving all extremities. No gross focal neurologic deficits are appreciated. Skin: Skin is warm, dry and intact. No rash noted. Psychiatric: Mood and affect are normal. Speech and behavior are normal.  ____________________________________________   LABS (all labs ordered are listed, but only abnormal results are displayed)  Labs Reviewed  WET PREP, GENITAL  CHLAMYDIA/NGC RT PCR ([facility] ONLY)  CBC WITH DIFFERENTIAL/PLATELET  COMPREHENSIVE METABOLIC PANEL  LIPASE, BLOOD  URINALYSIS, COMPLETE (UACMP) WITH MICROSCOPIC    ____________________________________________  EKG  none ____________________________________________  RADIOLOGY  US: PND  ____________________________________________   PROCEDURES  Procedure(s) performed: None Procedures Critical Care performed:  None ____________________________________________   INITIAL IMPRESSION / ASSESSMENT AND PLAN / ED COURSE  23 y.o. female G1, P0 currently at 7443w3d gestational age and h/o cholecystectomy who presents for evaluation of diffuse severe abdominal pain, nausea, vomiting.  Patient is moaning and in significant distress due to pain, she is tachycardic but afebrile, abdomen is obese with diffuse tenderness worse on the epigastrium and LUQ with no rebound or guarding.  Pelvic exam showing closed os with no CMT, no adnexal tenderness, heavy white vaginal discharge. POCUS showing IUD with good fetal movement and FRH 146. [facility] for same with extensive evaluation including labs, OB eval, US, CT a/p with no etiology. Ddx is broad and includes threatened miscarriage, PID, ovarian pathology, PUD, SOB, pancreatitis, retained CBD stone, kidney stone. Will give fentanyl and zofran. Check labs, UA,  imaging.   Clinical Course as of Oct 28 654  Sun Oct 28, 2018  0655 Labs, UA, [place] pending/ Care transferred to Dr. Joni Fears.   [CV]    Clinical Course User Index [CV] Alfred Levins [place], MD      As part of my medical decision making, I reviewed the following data within the [place] notes reviewed and incorporated, Old chart reviewed, Notes from prior ED visits and [place] Controlled Substance Database   Patient was evaluated in Emergency Department today for the symptoms described in the history of present illness. Patient was evaluated in the context of the global COVID-19 pandemic, which necessitated consideration that the patient might be at risk for infection with the SARS-CoV-2 virus that causes  COVID-19. Institutional protocols and algorithms that pertain to the evaluation of patients at risk for COVID-19 are in a state of rapid change based on information released by regulatory bodies including the CDC and federal and state organizations. These policies and algorithms were followed during the patient's care in the ED.   ____________________________________________   FINAL CLINICAL IMPRESSION(S) / ED DIAGNOSES   Final diagnoses:  Abdominal pain  Abdominal pain during pregnancy in second trimester      NEW MEDICATIONS STARTED DURING THIS VISIT:  ED Discharge Orders    None       Note:  This document was prepared using Dragon voice recognition software and may include unintentional dictation errors.    Alfred Levins, [place], MD 10/28/18 [phone]

## 2018-10-28 NOTE — ED Notes (Signed)
Assisted MD with pelvic exam

## 2018-10-28 NOTE — Discharge Instructions (Addendum)
Your lab tests and ultrasounds today were all okay.  Please follow-up with your obstetrics clinic for further evaluation and monitoring of your symptoms.  Continue taking your prenatal vitamins at home.

## 2018-11-28 DIAGNOSIS — F1210 Cannabis abuse, uncomplicated: Secondary | ICD-10-CM | POA: Insufficient documentation

## 2018-11-28 DIAGNOSIS — F1291 Cannabis use, unspecified, in remission: Secondary | ICD-10-CM | POA: Insufficient documentation

## 2019-03-21 ENCOUNTER — Inpatient Hospital Stay (HOSPITAL_COMMUNITY): Admission: RE | Admit: 2019-03-21 | Payer: Medicaid Other | Source: Home / Self Care

## 2019-04-06 ENCOUNTER — Emergency Department
Admission: EM | Admit: 2019-04-06 | Discharge: 2019-04-06 | Disposition: A | Discharge status: Home / Self Care | Payer: Medicaid Other | Attending: Emergency Medicine | Admitting: Emergency Medicine

## 2019-04-06 ENCOUNTER — Encounter: Payer: Self-pay | Admitting: Emergency Medicine

## 2019-04-06 ENCOUNTER — Other Ambulatory Visit: Payer: Self-pay

## 2019-04-06 DIAGNOSIS — I10 Essential (primary) hypertension: Secondary | ICD-10-CM | POA: Diagnosis not present

## 2019-04-06 DIAGNOSIS — Z5321 Procedure and treatment not carried out due to patient leaving prior to being seen by health care provider: Secondary | ICD-10-CM | POA: Diagnosis not present

## 2019-04-06 LAB — COMPREHENSIVE METABOLIC PANEL
ALT: 12 U/L (ref 0–44)
AST: 14 U/L — ABNORMAL LOW (ref 15–41)
Albumin: 3.1 g/dL — ABNORMAL LOW (ref 3.5–5.0)
Alkaline Phosphatase: 86 U/L (ref 38–126)
Anion gap: 10 (ref 5–15)
BUN: 11 mg/dL (ref 6–20)
CO2: 26 mmol/L (ref 22–32)
Calcium: 8.7 mg/dL — ABNORMAL LOW (ref 8.9–10.3)
Chloride: 106 mmol/L (ref 98–111)
Creatinine, Ser: 0.63 mg/dL (ref 0.44–1.00)
GFR calc Af Amer: >60 mL/min (ref >60)
GFR calc non Af Amer: >60 mL/min (ref >60)
Glucose, Bld: 97 mg/dL (ref 70–99)
Potassium: 3.7 mmol/L (ref 3.5–5.1)
Sodium: 142 mmol/L (ref 135–145)
Total Bilirubin: 0.5 mg/dL (ref 0.3–1.2)
Total Protein: 6.9 g/dL (ref 6.5–8.1)

## 2019-04-06 LAB — CBC WITH DIFFERENTIAL/PLATELET
Abs Immature Granulocytes: 0.1 10*3/uL — ABNORMAL HIGH (ref 0.00–0.07)
Basophils Absolute: 0.1 10*3/uL (ref 0.0–0.1)
Basophils Relative: 1 %
Eosinophils Absolute: 0.1 10*3/uL (ref 0.0–0.5)
Eosinophils Relative: 2 %
HCT: 32.9 % — ABNORMAL LOW (ref 36.0–46.0)
Hemoglobin: 10.9 g/dL — ABNORMAL LOW (ref 12.0–15.0)
Immature Granulocytes: 1 %
Lymphocytes Relative: 34 %
Lymphs Abs: 2.9 10*3/uL (ref 0.7–4.0)
MCH: 29.2 pg (ref 26.0–34.0)
MCHC: 33.1 g/dL (ref 30.0–36.0)
MCV: 88.2 fL (ref 80.0–100.0)
Monocytes Absolute: 0.3 10*3/uL (ref 0.1–1.0)
Monocytes Relative: 4 %
Neutro Abs: 5 10*3/uL (ref 1.7–7.7)
Neutrophils Relative %: 58 %
Platelets: 528 10*3/uL — ABNORMAL HIGH (ref 150–400)
RBC: 3.73 MIL/uL — ABNORMAL LOW (ref 3.87–5.11)
RDW: 13.2 % (ref 11.5–15.5)
WBC: 8.6 10*3/uL (ref 4.0–10.5)
nRBC: 0 % (ref 0.0–0.2)

## 2019-04-06 LAB — LACTIC ACID, PLASMA: Lactic Acid, Venous: 1 mmol/L (ref 0.5–1.9)

## 2019-04-06 NOTE — ED Notes (Signed)
First Nurse Note: Pt is 2 weeks post-partum c/o HTN Called and spoke with L&D. Per Estill Bamberg in L& D, pt to be seen in ED

## 2019-04-06 NOTE — ED Triage Notes (Signed)
Pt arrived via POV with reports of an emergency c-section on 12/12.  Pt had some high blood pressure during pregnancy and also had gestational diabetes.   PT reports HTN at home systolic in the 939Q. Pt also c/o headache. Pt also reports wound from c-section has been draining.  Pt states she had to have a second procedure after the c-section because they had to re-open the wound. Pt has been packing the wound herself.   Pt also reports she needs new wound supplies too at discharge.   PT OB-GYN is in [place].

## 2019-04-06 NOTE — ED Notes (Addendum)
Pt expressing desire to leave, advised her that I think she should stay to be seen, but pt wishes to leave. Advised patient to try to call on call provider at [facility] to let them know what is going on or try to get to back to [facility] where she had her procedure or another hospital. PT verbalized her understanding, pt is cooperative and calm upon departure. IV access removed. Pt ambulatory to lobby without difficulty. Advised patient to return if sxs worsened.

## 2019-04-06 NOTE — ED Notes (Signed)
Pt had has hard spot noted to abdomen above incision.

## 2019-10-31 ENCOUNTER — Encounter: Payer: Self-pay | Admitting: Physician Assistant

## 2019-10-31 NOTE — Progress Notes (Signed)
Received a refill request from pharmacy for patient for Acyclovir 400 mg #60 1 po BID for suppressive treatment.  Per chart review, seen 08/2017 for RP and last visit was 12/2017 (in [place]).  Form completed with note that patient will need an in-person visit for further refills through [facility].  Will ok total of 2 refills.  Form completed and faxed to pharmacy.  Fax confirmation received.

## 2020-01-10 ENCOUNTER — Telehealth: Payer: Self-pay | Admitting: Nurse Practitioner

## 2020-01-10 NOTE — Telephone Encounter (Signed)
Patient is calling to see if she can get a referral to [facility] clinic for rapid covid testing. Patient was offered covid testing with [facility]. Patient declined. Please advise CB- [phone]

## 2020-01-13 NOTE — Telephone Encounter (Signed)
Pt had the COVID test done already. She no longer need the referral.

## 2020-03-02 ENCOUNTER — Other Ambulatory Visit: Payer: Self-pay

## 2020-03-02 ENCOUNTER — Encounter: Payer: Self-pay | Admitting: Advanced Practice Midwife

## 2020-03-02 ENCOUNTER — Ambulatory Visit: Payer: Medicaid Other | Admitting: Advanced Practice Midwife

## 2020-03-02 DIAGNOSIS — F17290 Nicotine dependence, other tobacco product, uncomplicated: Secondary | ICD-10-CM | POA: Insufficient documentation

## 2020-03-02 DIAGNOSIS — Z113 Encounter for screening for infections with a predominantly sexual mode of transmission: Secondary | ICD-10-CM

## 2020-03-02 LAB — WET PREP FOR TRICH, YEAST, CLUE
Trichomonas Exam: NEGATIVE
Yeast Exam: NEGATIVE

## 2020-03-02 MED ORDER — DOXYCYCLINE HYCLATE 100 MG PO TABS: 100.0000 mg | ORAL_TABLET | Freq: Two times a day (BID) | ORAL | 0 refills | Status: DC

## 2020-03-02 MED ORDER — ACYCLOVIR 400 MG PO TABS: 400.0000 mg | ORAL_TABLET | Freq: Two times a day (BID) | ORAL | 6 refills | Status: DC

## 2020-03-02 MED ORDER — CEFTRIAXONE SODIUM 500 MG IJ SOLR
500.0000 mg | Freq: Once | INTRAMUSCULAR | Status: AC
  Administered 2020-03-02: 500 mg via INTRAMUSCULAR

## 2020-03-02 NOTE — Progress Notes (Signed)
Patient here to be treated as contact to gonorrhea. Also needs assessment to continue with herpes tx. Patient normally takes meds episodically.   Harvie Heck, RN   Post:  RN reviewed wet mount with patient. Patient tx for gonorrhea as a contact. Provider orders complete.   Harvie Heck, RN

## 2020-03-02 NOTE — Progress Notes (Signed)
[facility] Department STI clinic/screening visit  Subjective:  Angela Flynn is a 24 y.o. SBF exsmoker G1P1 female being seen today for an STI screening visit. The patient reports they do have symptoms.  Patient reports that they do not desire a pregnancy in the next year.   They reported they are not interested in discussing contraception today.  No LMP recorded.   Patient has the following medical conditions:   Patient Active Problem List   Diagnosis Date Noted  . Erythema migrans (Lyme disease) 08/31/2018  . Hyperemesis gravidarum 08/21/2018  . Morbid obesity (HCC) 310 lbs 07/30/2018  . Subchorionic hemorrhage in first trimester 07/30/2018  . Generalized abdominal pain   . Herpes genitalis 02/21/2018  . Gonorrhea 03/29/2015  . Pelvic inflammatory disease 03/28/2015  . SIRS (systemic inflammatory response syndrome) (HCC) 03/28/2015  . Leukocytosis 03/28/2015  . Hypokalemia 03/28/2015    No chief complaint on file.   HPI  Patient reports brown malodorous discharge x 4-5 days, FOB told her he had GC and he was treated on 02/28/20. Also wants Acyclovir for daily suppression. Last sex mid October without condom; with current partner x 12 years; 1 sex partner in last 3 mo.  Last cig age 54.  Last MJ age 34.  States never tasted ETOH.  Last pap 06/2018 neg.  Douches with water only.    Last HIV test per patient/review of record was 07/24/2018 Patient reports last pap was 06/2018 neg  See flowsheet for further details and programmatic requirements.    The following portions of the patient's history were reviewed and updated as appropriate: allergies, current medications, past medical history, past social history, past surgical history and problem list.  Objective:  There were no vitals filed for this visit.  Physical Exam Vitals and nursing note reviewed.  Constitutional:      Appearance: Normal appearance. She is obese.  HENT:     Head: Normocephalic and  atraumatic.     Mouth/Throat:     Mouth: Mucous membranes are moist.     Pharynx: Oropharynx is clear. No oropharyngeal exudate or posterior oropharyngeal erythema.  Eyes:     Conjunctiva/sclera: Conjunctivae normal.  Pulmonary:     Effort: Pulmonary effort is normal.  Abdominal:     Palpations: Abdomen is soft. There is no mass.     Tenderness: There is no abdominal tenderness. There is no rebound.     Comments: Soft without masses or tenderness, poor tone, increased adipose  Genitourinary:    General: Normal vulva.     Exam position: Lithotomy position.     Pubic Area: No rash or pubic lice.      Labia:        Right: No rash or lesion.        Left: No rash or lesion.      Vagina: Vaginal discharge (grey sl malodorous leukorrhea,ph inconclusive) present. No erythema, bleeding or lesions.     Cervix: Normal.     Uterus: Normal.      Rectum: Normal.     Comments: Difficult to assess due to increased adipose Lymphadenopathy:     Head:     Right side of head: No preauricular or posterior auricular adenopathy.     Left side of head: No preauricular or posterior auricular adenopathy.     Cervical: No cervical adenopathy.     Upper Body:     Right upper body: No supraclavicular or axillary adenopathy.     Left upper body:  No supraclavicular or axillary adenopathy.     Lower Body: No right inguinal adenopathy. No left inguinal adenopathy.  Skin:    General: Skin is warm and dry.     Findings: No rash.  Neurological:     Mental Status: She is alert and oriented to person, place, and time.      Assessment and Plan:  Angela Flynn is a 24 y.o. female presenting to the [facility] Department for STI screening  1. Screening examination for venereal disease Treat as contact to GC per standing orders Treat wet mount per standing orders Immunization nurse consult E-rx Acyclovir #60 for daily suppression per pt request to her pharmacy - WET PREP FOR TRICH, YEAST,  CLUE - Chlamydia/Gonorrhea [facility] - acyclovir (ZOVIRAX) 400 MG tablet; Take 1 tablet (400 mg total) by mouth 2 (two) times daily.  Dispense: 60 tablet; Refill: 6     No follow-ups on file.  Future Appointments  Date Time Provider Department Center  03/04/2020  4:00 PM Smitty Cords, DO [facility]    Alberteen Spindle, CNM

## 2020-03-03 IMAGING — CT CT ABDOMEN AND PELVIS WITHOUT CONTRAST
2 of 4 series · 16 of 46 positions shown, 18 images · non-contrast
Comparison: Abdominal ultrasound 07/23/2018.  CT scan 08/12/2017.

CLINICAL DATA: Upper mid abdominal pain worsening over the last 5
days. Patient with known intrauterine gestation. Consent for CT scan
obtained by referring physician.

EXAM:
CT ABDOMEN AND PELVIS WITHOUT CONTRAST
TECHNIQUE: Multidetector CT imaging of the abdomen and pelvis was performed
following the standard protocol without IV contrast.

[Series 3: a/p w/o 5mm · axial · non-contrast · 0.79mm/px · z∈[-596,-152]mm · 13 of 97 slices shown, 15 images]
[im 4/97  soft-tissue]
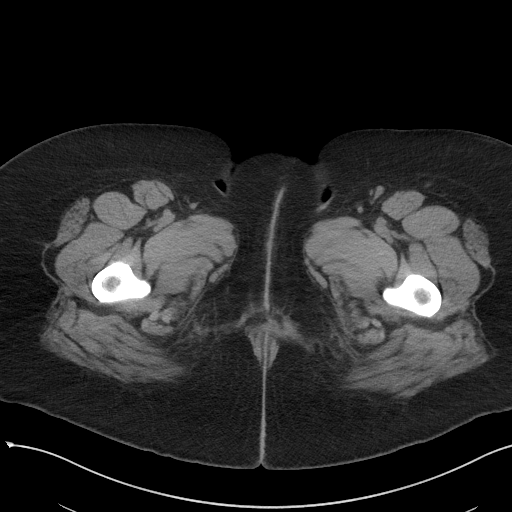
[im 4/97  bone]
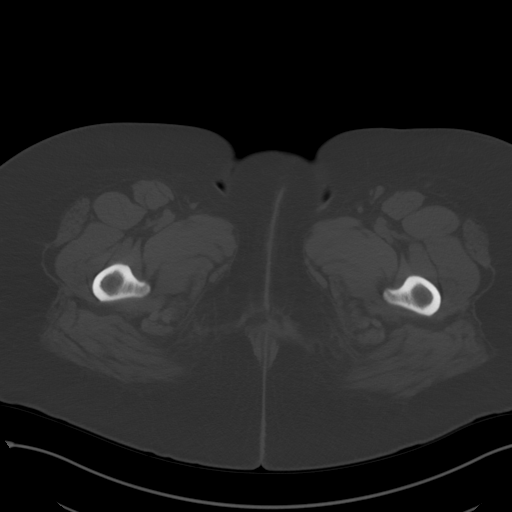
[im 12/97  soft-tissue]
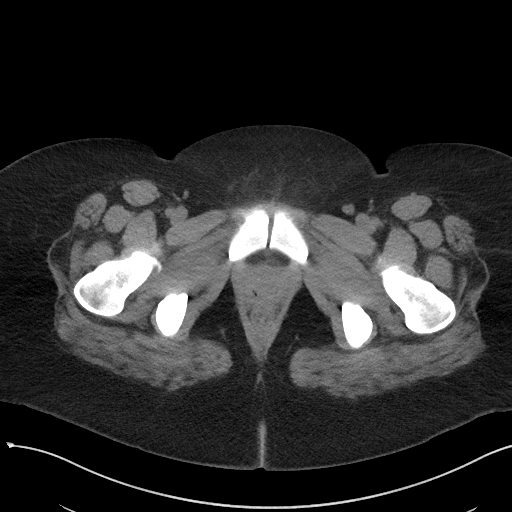
[im 19/97  soft-tissue]
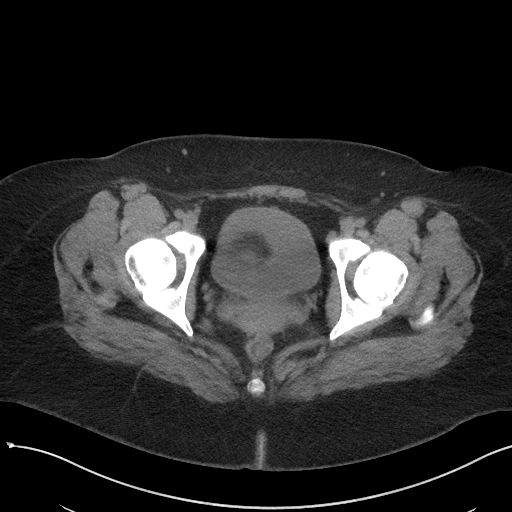
[im 26/97  soft-tissue]
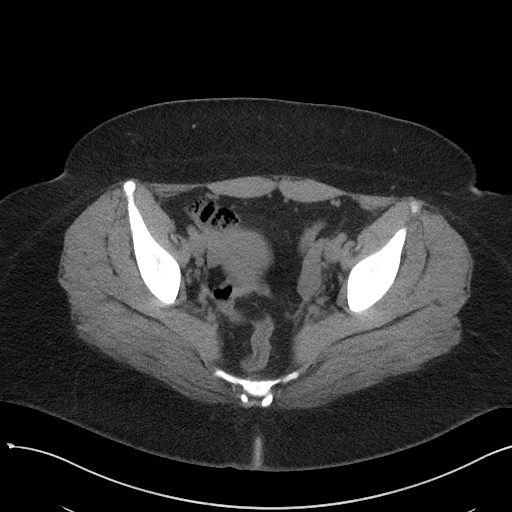
[im 34/97  soft-tissue]
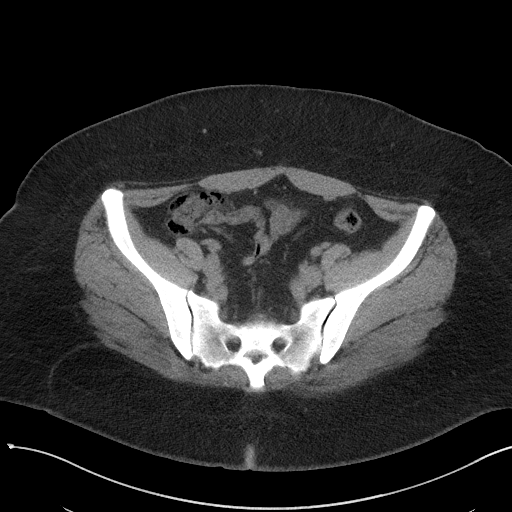
[im 41/97  soft-tissue]
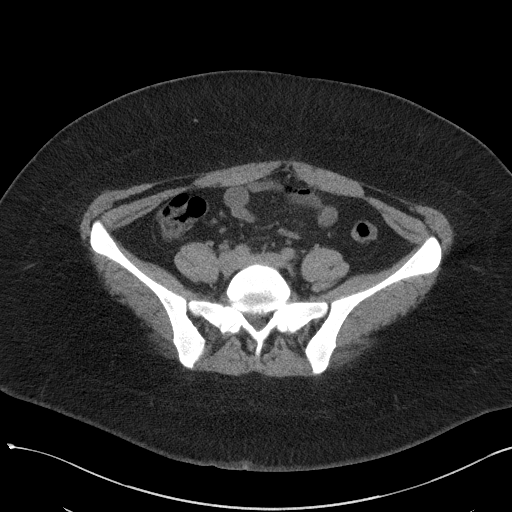
[im 49/97  soft-tissue]
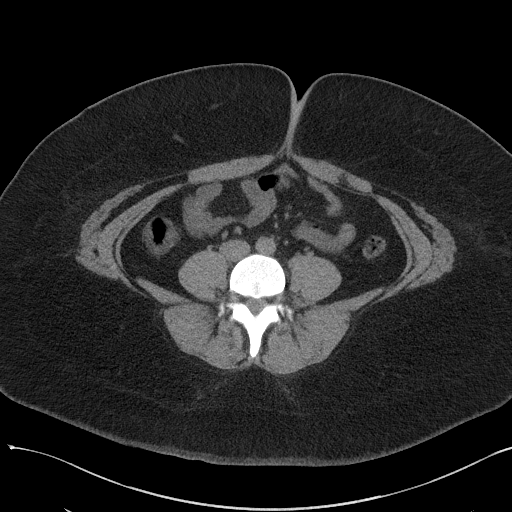
[im 56/97  soft-tissue]
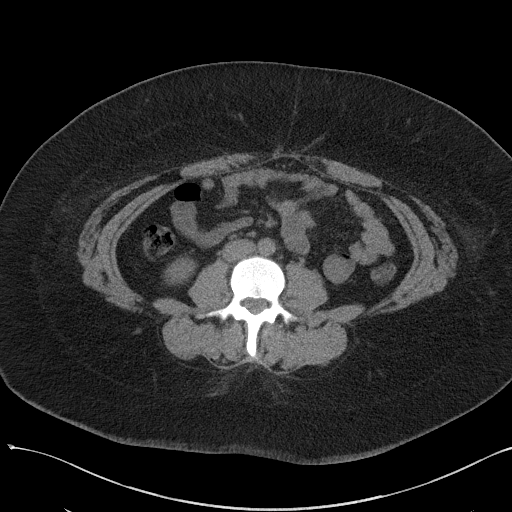
[im 63/97  soft-tissue]
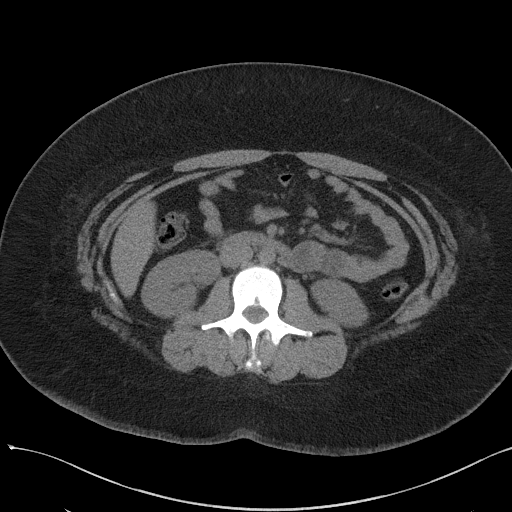
[im 63/97  bone]
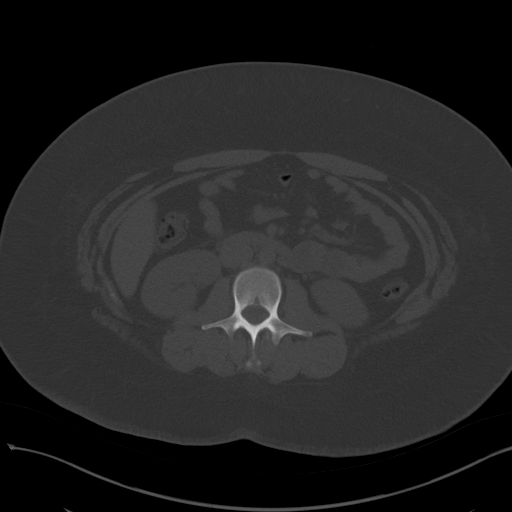
[im 71/97  soft-tissue]
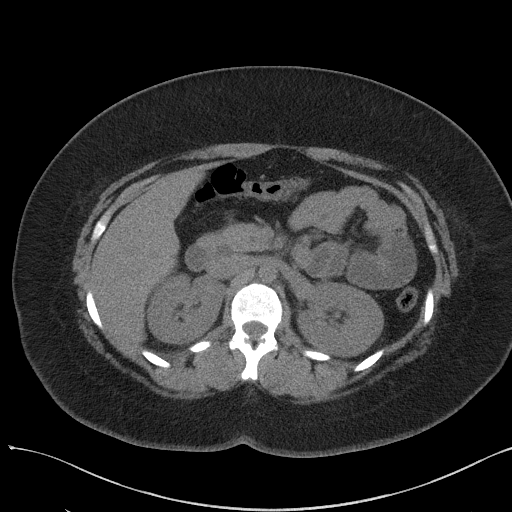
[im 78/97  soft-tissue]
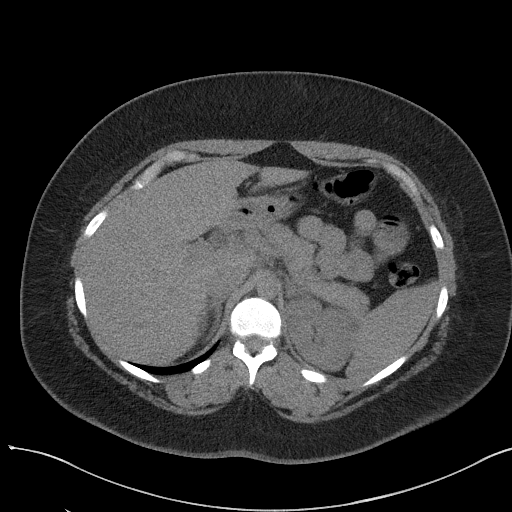
[im 85/97  soft-tissue]
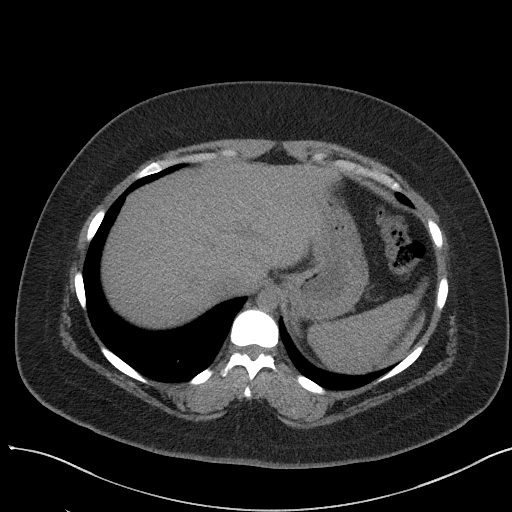
[im 93/97  soft-tissue]
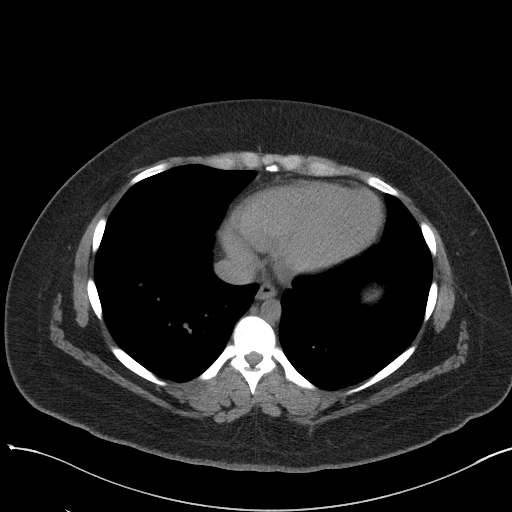

[Series 6: a/p w/o cor · coronal · non-contrast · 0.83mm/px · 3 of 151 slices shown]
[im 51/151  soft-tissue]
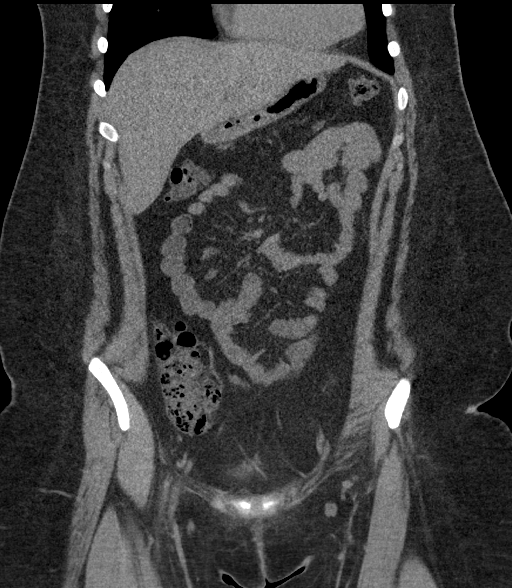
[im 67/151  soft-tissue]
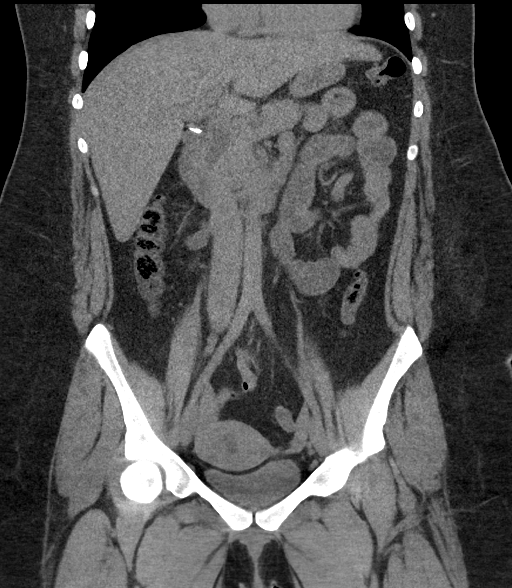
[im 84/151  soft-tissue]
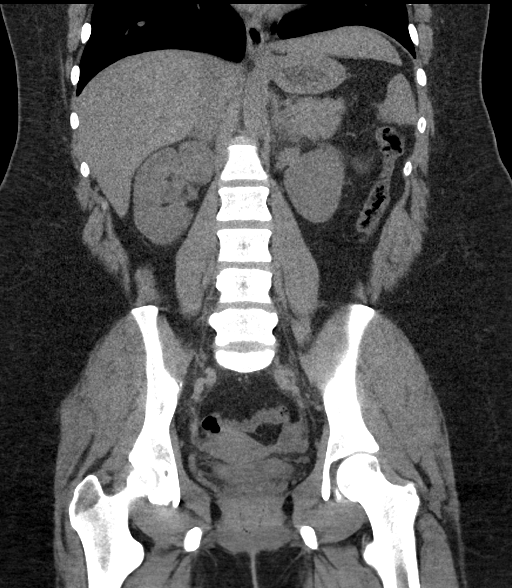

[16 of 46 positions shown; findings below may reference images not displayed]

FINDINGS: Lower chest: Unremarkable.

Hepatobiliary: 6 mm low-density lesion in the dome of liver is
similar to prior. The liver shows diffusely decreased attenuation
suggesting steatosis. Gallbladder surgically absent. No intrahepatic
or extrahepatic biliary dilation.

Pancreas: No focal mass lesion. No dilatation of the main duct. No
intraparenchymal cyst. No peripancreatic edema.

Spleen: No splenomegaly. No focal mass lesion.

Adrenals/Urinary Tract: No adrenal nodule or mass. Kidneys
unremarkable on this noncontrast study. No evidence for hydroureter.
The urinary bladder appears normal for the degree of distention.

Stomach/Bowel: Stomach is unremarkable. No gastric wall thickening.
No evidence of outlet obstruction. Duodenum is normally positioned
as is the ligament of Treitz. No small bowel wall thickening. No
small bowel dilatation. The terminal ileum is normal. The appendix
is normal. No gross colonic mass. No colonic wall thickening.

Vascular/Lymphatic: No abdominal aortic aneurysm. No abdominal
aortic atherosclerotic calcification. There is no gastrohepatic or
hepatoduodenal ligament lymphadenopathy. No intraperitoneal or
retroperitoneal lymphadenopathy. No pelvic sidewall lymphadenopathy.

Reproductive: Focal fluid collection in the endometrium compatible
with tiny gestational sac period. There is no adnexal mass.

Other: No intraperitoneal free fluid.

Musculoskeletal: No worrisome lytic or sclerotic osseous
abnormality.
IMPRESSION: 1. No acute findings in the abdomen or pelvis. Specifically, no
findings to explain the patient's history of mid abdominal pain with
nausea and vomiting.
2. Hepatic steatosis.

Note: I was not contacted about or aware of this case prior to the
patient being scanned.

## 2020-03-04 ENCOUNTER — Telehealth (INDEPENDENT_AMBULATORY_CARE_PROVIDER_SITE_OTHER): Payer: Medicaid Other | Admitting: Family Medicine

## 2020-03-04 ENCOUNTER — Encounter: Payer: Self-pay | Admitting: Family Medicine

## 2020-03-04 ENCOUNTER — Other Ambulatory Visit: Payer: Self-pay

## 2020-03-04 DIAGNOSIS — J0100 Acute maxillary sinusitis, unspecified: Secondary | ICD-10-CM

## 2020-03-04 DIAGNOSIS — N946 Dysmenorrhea, unspecified: Secondary | ICD-10-CM

## 2020-03-04 MED ORDER — FLUTICASONE PROPIONATE 50 MCG/ACT NA SUSP: 2.0000 | Freq: Every day | NASAL | 3 refills | Status: DC

## 2020-03-04 MED ORDER — DOXYCYCLINE HYCLATE 100 MG PO TABS: 100.0000 mg | ORAL_TABLET | Freq: Two times a day (BID) | ORAL | 0 refills | Status: DC

## 2020-03-04 MED ORDER — IBUPROFEN 800 MG PO TABS: 800.0000 mg | ORAL_TABLET | Freq: Three times a day (TID) | ORAL | 2 refills | Status: DC | PRN

## 2020-03-04 NOTE — Patient Instructions (Addendum)
1. Start taking Doxycycline antibiotic 100mg  twice daily for 10 days. Take with full glass of water and stay upright for at least 30 min after taking, may be seated or standing, but should NOT lay down. This is just a safety precaution, if this medicine does not go all the way down throat well it could cause some burning discomfort to throat and esophagus. - Since Health Dept recommended this but she did not get the sample or rx - will use this as empiric coverage for potential STI same as recommended by health dept, and also will treat Sinusitis and Bronchitis if has developed lower respiratory infection.  Start nasal steroid Flonase 2 sprays in each nostril daily for 4-6 weeks, may repeat course seasonally or as needed   Re order Ibuprofen 800mg  PRN menstrual cramps, chronic med using PRN  Regarding lightheadedness, dizziness - discussed benefit of improve nutrition, hydration, caution sudden turn or move or stand up  Recommend COVID testing or seek care immediately at Urgent Care or hospital ED if worsening symptoms or new concerns.  Please schedule a Follow-up Appointment to: Return in about 1 week (around 03/11/2020), or if symptoms worsen or fail to improve.  If you have any other questions or concerns, please feel free to call the office or send a message through MyChart. You may also schedule an earlier appointment if necessary.  Additionally, you may be receiving a survey about your experience at our office within a few days to 1 week by e-mail or mail. We value your feedback.  , DO [facility], Saralyn Pilar

## 2020-03-04 NOTE — Progress Notes (Signed)
Virtual Visit via Telephone The purpose of this virtual visit is to provide medical care while limiting exposure to the novel coronavirus (COVID19) for both patient and office staff.  Consent was obtained for phone visit:  Yes.   Answered questions that patient had about telehealth interaction:  Yes.   I discussed the limitations, risks, security and privacy concerns of performing an evaluation and management service by telephone. I also discussed with the patient that there may be a patient responsible charge related to this service. The patient expressed understanding and agreed to proceed.  Patient Location: Home Provider Location: [facility] (Office)  Participants in virtual visit: - Patient: Angela Flynn - CMA: Frederich Cha, [place] - Provider: Dr Parks Ranger  ---------------------------------------------------------------------- Chief Complaint  Patient presents with  . Sinus Problem    cough, SOB denies fever or HA but little chills and sore throat was last week which is improved now onset 10 days    Previous PCP Cassell Smiles, AGPCNP-BC  S: Reviewed CMA documentation. I have called patient and gathered additional HPI as follows:  Acute Sinusitis Reports that symptoms started about 10 days ago, she was exposed to friend who has several children who were sick. They had negative COVID testing. She developed similar symptoms with feeling sick and with upset stomach. She describes now with chest congestion and cough with darker brown and green mucus when blowing nose and productive cough.  Additionally - Lightheadedness and Dizziness worse - lately with recent sinusitis symptoms. She has had episodes of lightheadedness worsening. This has been present longer however, prior to onset sinusitis symptoms. Had near syncope in past.  Her 56 month old daughter was sick as well.  Also had visit to [facility] for STD 03/02/20, she had wet prep swab, refilled  Acyclovir, for suppression genital herpes. They ordered a sample of doxycycline and Rocephin shot, she did not get the doxycycline however  Denies any fevers, chills, sweats, body ache, shortness of breath, sinus pain or pressure, headache, abdominal pain, diarrhea  Past Medical History:  Diagnosis Date  . Allergy    seasonal, dusts  . Herpes genitalis   . MRSA infection    abscesses - history of   Social History   Tobacco Use  . Smoking status: Former Smoker    Types: Cigars  . Smokeless tobacco: Never Used  . Tobacco comment: 2-5 cigars per day on average, which pt uses to smoke marijuana  Vaping Use  . Vaping Use: Never used  Substance Use Topics  . Alcohol use: Not Currently  . Drug use: Not Currently    Types: Marijuana    Comment: 2-5 x per day    Current Outpatient Medications:  .  acyclovir (ZOVIRAX) 400 MG tablet, Take 1 tablet (400 mg total) by mouth 2 (two) times daily., Disp: 60 tablet, Rfl: 6 .  AMBULATORY NON FORMULARY MEDICATION, 1 Device by Other route once a week. Blood Pressure Cuff Medium Monitored Regularly at home ICD 10:Z34.90 (Patient not taking: Reported on 03/04/2020), Disp: 1 kit, Rfl: 0 .  doxycycline (VIBRA-TABS) 100 MG tablet, Take 1 tablet (100 mg total) by mouth 2 (two) times daily. For 10 days. Take with full glass of water, stay upright 30 min after taking., Disp: 20 tablet, Rfl: 0 .  fluticasone (FLONASE) 50 MCG/ACT nasal spray, Place 2 sprays into both nostrils daily. Use for 4-6 weeks then stop and use seasonally or as needed., Disp: 16 g, Rfl: 3 .  ibuprofen (ADVIL) 800 MG  tablet, Take 1 tablet (800 mg total) by mouth every 8 (eight) hours as needed for moderate pain or cramping., Disp: 90 tablet, Rfl: 2  Depression screen [facility] 2/9 08/31/2018 02/21/2018  Decreased Interest 3 0  Down, Depressed, Hopeless 0 0  PHQ - 2 Score 3 0  Altered sleeping 3 3  Tired, decreased energy 3 3  Change in appetite 0 3  Feeling bad or failure about yourself  1  0  Trouble concentrating 0 3  Moving slowly or fidgety/restless 0 0  Suicidal thoughts 0 0  PHQ-9 Score 10 12  Difficult doing work/chores - Extremely dIfficult    GAD 7 : Generalized Anxiety Score 08/31/2018  Nervous, Anxious, on Edge 3  Control/stop worrying 3  Worry too much - different things 3  Trouble relaxing 3  Restless 0  Easily annoyed or irritable 3  Afraid - awful might happen 0  Total GAD 7 Score 15    -------------------------------------------------------------------------- O: No physical exam performed due to remote telephone encounter.  Lab results reviewed.  Recent Results (from the past 2160 hour(s))  WET PREP FOR TRICH, YEAST, CLUE     Status: None   Collection Time: 03/02/20  3:20 PM  Result Value Ref Range   Trichomonas Exam Negative Negative   Yeast Exam Negative Negative   Clue Cell Exam Comment: Negative    Comment: NEG;AMINE NEG    -------------------------------------------------------------------------- A&P:  Problem List Items Addressed This Visit    None    Visit Diagnoses    Acute non-recurrent maxillary sinusitis    -  Primary   Relevant Medications   doxycycline (VIBRA-TABS) 100 MG tablet   fluticasone (FLONASE) 50 MCG/ACT nasal spray   Menstrual cramps       Relevant Medications   ibuprofen (ADVIL) 800 MG tablet     Consistent with acute frontal sinusitis, likely initially viral URI with sick contacts - cannot rule out COVID. Unvaccinated, however sick contacts had negative covid test. She did not get tested. Given description on history now worsening concern for bacterial infection.   Plan: 1. Start taking Doxycycline antibiotic 130m twice daily for 10 days. Take with full glass of water and stay upright for at least 30 min after taking, may be seated or standing, but should NOT lay down. This is just a safety precaution, if this medicine does not go all the way down throat well it could cause some burning discomfort to throat  and esophagus. - Since Health Dept recommended this but she did not get the sample or rx - will use this as empiric coverage for potential STI same as recommended by health dept, and also will treat Sinusitis and Bronchitis if has developed lower respiratory infection.  Start nasal steroid Flonase 2 sprays in each nostril daily for 4-6 weeks, may repeat course seasonally or as needed   Re order Ibuprofen 8090mPRN menstrual cramps, chronic med using PRN  Regarding lightheadedness, dizziness - discussed benefit of improve nutrition, hydration, caution sudden turn or move or stand up, now improved but worse with current sinus symptoms.  Return criteria reviewed   Meds ordered this encounter  Medications  . doxycycline (VIBRA-TABS) 100 MG tablet    Sig: Take 1 tablet (100 mg total) by mouth 2 (two) times daily. For 10 days. Take with full glass of water, stay upright 30 min after taking.    Dispense:  20 tablet    Refill:  0  . fluticasone (FLONASE) 50 MCG/ACT nasal spray  Sig: Place 2 sprays into both nostrils daily. Use for 4-6 weeks then stop and use seasonally or as needed.    Dispense:  16 g    Refill:  3  . ibuprofen (ADVIL) 800 MG tablet    Sig: Take 1 tablet (800 mg total) by mouth every 8 (eight) hours as needed for moderate pain or cramping.    Dispense:  90 tablet    Refill:  2    Follow-up: - Return as needed for sinusitis, return to establish with Cyndia Skeeters, FNP since lost to follow-up since last seen Cassell Smiles, AGPCNP-BC   Patient verbalizes understanding with the above medical recommendations including the limitation of remote medical advice.  Specific follow-up and call-back criteria were given for patient to follow-up or seek medical care more urgently if needed.   - Time spent in direct consultation with patient on phone: 10 minutes   Nobie Putnam, [facility] 03/04/2020, 4:04 PM

## 2020-03-11 ENCOUNTER — Telehealth: Payer: Self-pay

## 2020-03-11 DIAGNOSIS — A549 Gonococcal infection, unspecified: Secondary | ICD-10-CM

## 2020-03-11 NOTE — Telephone Encounter (Signed)
TC to patient. Verified ID via password/SS#. Informed of positive GC. Patient tx'd at visit.  Co to RTC for TOC in 3 months. Richmond Campbell, RN

## 2020-03-11 NOTE — Telephone Encounter (Signed)
Attempted TC to patient.  [facility] RN.  Patient tx'd at visit. +GC. CD report completed Richmond Campbell, RN

## 2020-04-16 ENCOUNTER — Ambulatory Visit: Payer: Self-pay | Admitting: *Deleted

## 2020-04-16 NOTE — Telephone Encounter (Signed)
Pt's mother, Lyn Records called in however [place] was with her answering my questions on speaker phone.  She did a home covid test which was positive.  [place] is having multiple covid symptoms but the vomiting is the worse.   She has not taken anything for the vomiting so I went over the OTC medications she can use.  I went over the care advice and the quarantine protocol with all of them mother, sister, and pt.  Also the 27 yr old daughter and the quarantine for all of them.  They verbalized understanding.   I answered their questions.  I instructed [place] to call back if her vomiting does not improve with the OTC medications that were discussed.   She was agreeable to this plan.   Reason for Disposition . [1] COVID-19 diagnosed AND [2] has mild nausea, vomiting or diarrhea  Answer Assessment - Initial Assessment Questions 1. COVID-19 DIAGNOSIS: "Who made your COVID-19 diagnosis?" "Was it confirmed by a positive lab test?" If not diagnosed by a HCP, ask "Are there lots of cases (community spread) where you live?" Note: See public health department website, if unsure.     *Positive covid, vomiting, sore throat, dizziness, headache and dizziness and back ache.  Mother on line with her.  Having hot and cold flashes. Having symptoms.   Cystal has a 1 yr. Daughter.  Acting fine.   2. COVID-19 EXPOSURE: "Was there any known exposure to COVID before the symptoms began?" CDC Definition of close contact: within 6 feet (2 meters) for a total of 15 minutes or more over a 24-hour period.      Started having symptoms so did a home test and it's positive. I went over the 10 day quarantine protocol, use of OTC medications for symptoms, when to go to the ED for shortness of breath or chest pain or worsening symptoms. 3. ONSET: "When did the COVID-19 symptoms start?"      Not sure 4. WORST SYMPTOM: "What is your worst symptom?" (e.g., cough, fever, shortness of breath, muscle aches)     vomiting 5. COUGH:  "Do you have a cough?" If Yes, ask: "How bad is the cough?"       Yes dry cough 6. FEVER: "Do you have a fever?" If Yes, ask: "What is your temperature, how was it measured, and when did it start?"     Probably has body aches and hot and cold flashes. 7. RESPIRATORY STATUS: "Describe your breathing?" (e.g., shortness of breath, wheezing, unable to speak)      Denies shortness of breath 8. BETTER-SAME-WORSE: "Are you getting better, staying the same or getting worse compared to yesterday?"  If getting worse, ask, "In what way?"     Vomiting and all is the same. 9. HIGH RISK DISEASE: "Do you have any chronic medical problems?" (e.g., asthma, heart or lung disease, weak immune system, obesity, etc.)     No underlying health issues when questioned. 10. VACCINE: "Have you gotten the COVID-19 vaccine?" If Yes ask: "Which one, how many shots, when did you get it?"       No 11. PREGNANCY: "Is there any chance you are pregnant?" "When was your last menstrual period?"       Not asked  She has a 70 yr old daughter. 12. OTHER SYMPTOMS: "Do you have any other symptoms?"  (e.g., chills, fatigue, headache, loss of smell or taste, muscle pain, sore throat; new loss of smell or taste especially support the diagnosis of COVID-19)  See above for list of symptoms.  Protocols used: CORONAVIRUS (COVID-19) DIAGNOSED OR SUSPECTED-A-AH

## 2020-06-30 ENCOUNTER — Other Ambulatory Visit: Payer: Self-pay

## 2020-06-30 ENCOUNTER — Ambulatory Visit (INDEPENDENT_AMBULATORY_CARE_PROVIDER_SITE_OTHER): Payer: Medicaid Other | Admitting: Unknown Physician Specialty

## 2020-06-30 ENCOUNTER — Encounter: Payer: Self-pay | Admitting: Unknown Physician Specialty

## 2020-06-30 DIAGNOSIS — T148XXA Other injury of unspecified body region, initial encounter: Secondary | ICD-10-CM

## 2020-06-30 DIAGNOSIS — R1084 Generalized abdominal pain: Secondary | ICD-10-CM | POA: Diagnosis not present

## 2020-06-30 NOTE — Assessment & Plan Note (Signed)
Non-specific. Will check [place] as obese to r/o gallbladder.  Suspect IBS. Discussed stool softeners which makes it worse. Labs

## 2020-06-30 NOTE — Progress Notes (Signed)
There were no vitals taken for this visit.   Subjective:    Patient ID: Angela Flynn, female    DOB: Mar 08, 1996, 25 y.o.   MRN: [id number]  HPI: Angela Flynn is a 25 y.o. female  Chief Complaint  Patient presents with  . Nausea    Associated with her abdominal pain.  . Abdominal Pain    Pt has been having pain since she gave birth to her daughter, so it has been a year ago. It is a constant pain that she gets randomly on a everyday basis.   . Bruises    Random bruises on both of her legs they hurt after a while. Pt says she has been having them for a few months and she is concerned.    This visit was completed via telephone due to the restrictions of the COVID-19 pandemic. All issues as above were discussed and addressed but no physical exam was performed. If it was felt that the patient should be evaluated in the office, they were directed there. The patient verbally consented to this visit. Patient was unable to complete an audio/visual visit due to Technical difficulties. . Location of the patient: home . Location of the provider: home . Those involved with this call:  . Provider: Gabriel Cirri FNP . CMA: Remer Macho . Front Desk/Registration: Fleet Contras  . Time spent on call: 15 minutes on the phone discussing health concerns. 10 minutes total spent in review of patient's record and preparation of their chart.  I verified patient identity using two factors (patient name and date of birth). Patient consents verbally to being seen via telemedicine visit today.    Pt is calling with concerns about abdominal pain and nausea.    Abdominal Pain Chronicity: 1 year since delivery by c section. Episode frequency: random in time and frequency. Pain location: fluctuates, more in middle, left or right, and sometimes in her back. The patient is experiencing no pain. The quality of the pain is cramping and sharp. The abdominal pain radiates to the back. Associated symptoms  include nausea. Pertinent negatives include no constipation, diarrhea, frequency or hematochezia. Associated symptoms comments: Feels incomplete emptying of stool. Nothing aggravates the pain. The pain is relieved by nothing. She has tried acetaminophen for the symptoms. The treatment provided no relief.    Relevant past medical, surgical, family and social history reviewed and updated as indicated. Interim medical history since our last visit reviewed. Allergies and medications reviewed and updated.  Review of Systems  Gastrointestinal: Positive for abdominal pain and nausea. Negative for constipation, diarrhea and hematochezia.  Genitourinary: Negative for frequency.    Per HPI unless specifically indicated above     Objective:    There were no vitals taken for this visit.  Wt Readings from Last 3 Encounters:  04/06/19 (!) 310 lb (140.6 kg)  10/28/18 280 lb (127 kg)  08/21/18 256 lb 9.6 oz (116.4 kg)    Physical Exam Skin:    General: Skin is warm and dry.  Neurological:     Mental Status: She is alert.  Psychiatric:        Mood and Affect: Mood normal.     Results for orders placed or performed in visit on 03/02/20  WET PREP FOR TRICH, YEAST, CLUE  Result Value Ref Range   Trichomonas Exam Negative Negative   Yeast Exam Negative Negative   Clue Cell Exam Comment: Negative      Assessment & Plan:   Problem List  Items Addressed This Visit      Unprioritized   Generalized abdominal pain - Primary    Non-specific. Will check [place] as obese to r/o gallbladder.  Suspect IBS. Discussed stool softeners which makes it worse. Labs      Relevant Orders   US Abdomen Complete   Comprehensive metabolic panel   CBC with Differential/Platelet   Amylase   Lipase   TSH    Other Visit Diagnoses    Bruising       Check CBC. Liver enzymes       Follow up plan: Return labs and encouraged in-person visit.

## 2020-07-13 ENCOUNTER — Ambulatory Visit: Payer: Medicaid Other

## 2020-08-14 ENCOUNTER — Ambulatory Visit: Payer: Medicaid Other

## 2020-08-19 ENCOUNTER — Other Ambulatory Visit: Payer: Self-pay

## 2020-08-19 ENCOUNTER — Ambulatory Visit: Payer: Medicaid Other | Admitting: Physician Assistant

## 2020-08-19 ENCOUNTER — Encounter: Payer: Self-pay | Admitting: Physician Assistant

## 2020-08-19 DIAGNOSIS — A6004 Herpesviral vulvovaginitis: Secondary | ICD-10-CM

## 2020-08-19 DIAGNOSIS — Z113 Encounter for screening for infections with a predominantly sexual mode of transmission: Secondary | ICD-10-CM

## 2020-08-19 LAB — WET PREP FOR TRICH, YEAST, CLUE
Trichomonas Exam: NEGATIVE
Yeast Exam: NEGATIVE

## 2020-08-19 MED ORDER — VALACYCLOVIR HCL 1 G PO TABS: 1000.0000 mg | ORAL_TABLET | Freq: Every day | ORAL | 12 refills | Status: DC

## 2020-08-19 NOTE — Progress Notes (Signed)
  [facility] Department STI clinic/screening visit  Subjective:  Angela Flynn is a 25 y.o. female being seen today for an STI screening visit. The patient reports they do have symptoms.  Patient reports that they do not desire a pregnancy in the next year.   They reported they are not interested in discussing contraception today.  No LMP recorded.   Patient has the following medical conditions:   Patient Active Problem List   Diagnosis Date Noted  . Cigar smoker 03/02/2020  . Morbid obesity (HCC) 310 lbs 07/30/2018  . Generalized abdominal pain   . Herpes genitalis 02/21/2018  . SIRS (systemic inflammatory response syndrome) (HCC) 03/28/2015  . Leukocytosis 03/28/2015  . Hypokalemia 03/28/2015    Chief Complaint  Patient presents with  . SEXUALLY TRANSMITTED DISEASE    screening    HPI  Patient reports that she has had vaginal odor off and on for several months.  States that she has been douching 2 times a week to help with the odor with plain water.  Denies chronic conditions and regular medicines.  Patient requests change in suppressive treatment for HSV since she does not feel that the Acyclovir is keeping her outbreaks under control.  States last HIV test was in 02/2020 and last pap was 06/2018.  See flowsheet for further details and programmatic requirements.    The following portions of the patient's history were reviewed and updated as appropriate: allergies, current medications, past medical history, past social history, past surgical history and problem list.  Objective:  There were no vitals filed for this visit.  Physical Exam Constitutional:      General: She is not in acute distress.    Appearance: Normal appearance. She is obese.  HENT:     Head: Normocephalic and atraumatic.  Eyes:     Conjunctiva/sclera: Conjunctivae normal.  Pulmonary:     Effort: Pulmonary effort is normal.  Abdominal:     Palpations: Abdomen is soft.  Skin:     General: Skin is warm and dry.  Neurological:     Mental Status: She is alert and oriented to person, place, and time.  Psychiatric:        Mood and Affect: Mood normal.        Behavior: Behavior normal.        Thought Content: Thought content normal.        Judgment: Judgment normal.      Assessment and Plan:  Keeya Dyckman is a 25 y.o. female presenting to the [facility] Department for STI screening  1. Screening for STD (sexually transmitted disease) Patient into clinic without symptoms. Patient declines blood work today. Patient declines pelvic exam by provider and opts to self-collect vaginal samples for testing.  Reviewed with patient how to collect for accurate results. Rec condoms with all sex. Await test results.  Counseled that RN will call if needs to RTC for treatment once results are back. - WET PREP FOR TRICH, YEAST, CLUE - Chlamydia/Gonorrhea [facility]  2. Herpes simplex vulvovaginitis Rx for Valtrex 1 g #30 1 po daily with refills for 1 year sent to patient's pharmacy of choice per her request. - valACYclovir (VALTREX) 1000 MG tablet; Take 1 tablet (1,000 mg total) by mouth daily.  Dispense: 30 tablet; Refill: 12     No follow-ups on file.  No future appointments.  Matt Holmes, PA

## 2020-10-20 ENCOUNTER — Other Ambulatory Visit: Payer: Self-pay

## 2020-10-20 ENCOUNTER — Emergency Department: Payer: Medicaid Other

## 2020-10-20 ENCOUNTER — Encounter: Payer: Self-pay | Admitting: Emergency Medicine

## 2020-10-20 ENCOUNTER — Emergency Department
Admission: EM | Admit: 2020-10-20 | Discharge: 2020-10-20 | Disposition: A | Discharge status: Home / Self Care | Payer: Medicaid Other | Attending: Emergency Medicine | Admitting: Emergency Medicine

## 2020-10-20 DIAGNOSIS — R0981 Nasal congestion: Secondary | ICD-10-CM | POA: Insufficient documentation

## 2020-10-20 DIAGNOSIS — B349 Viral infection, unspecified: Secondary | ICD-10-CM | POA: Diagnosis not present

## 2020-10-20 DIAGNOSIS — Z87891 Personal history of nicotine dependence: Secondary | ICD-10-CM | POA: Insufficient documentation

## 2020-10-20 DIAGNOSIS — Z28310 Unvaccinated for covid-19: Secondary | ICD-10-CM | POA: Insufficient documentation

## 2020-10-20 DIAGNOSIS — Z20822 Contact with and (suspected) exposure to covid-19: Secondary | ICD-10-CM | POA: Diagnosis not present

## 2020-10-20 DIAGNOSIS — R059 Cough, unspecified: Secondary | ICD-10-CM | POA: Diagnosis present

## 2020-10-20 LAB — RESP PANEL BY RT-PCR (FLU A&B, COVID) ARPGX2
Influenza A by PCR: NEGATIVE
Influenza B by PCR: NEGATIVE
SARS Coronavirus 2 by RT PCR: NEGATIVE

## 2020-10-20 MED ORDER — ALBUTEROL SULFATE HFA 108 (90 BASE) MCG/ACT IN AERS: 2.0000 | INHALATION_SPRAY | Freq: Four times a day (QID) | RESPIRATORY_TRACT | 0 refills | Status: DC | PRN

## 2020-10-20 NOTE — ED Provider Notes (Signed)
[facility] Emergency Department Provider Note   ____________________________________________   Event Date/Time   First MD Initiated Contact with Patient 10/20/20 1442     (approximate)  I have reviewed the triage vital signs and the nursing notes.   HISTORY  Chief Complaint Congestion    HPI Angela Flynn is a 25 y.o. female with no significant past medical history who presents to the ED complaining of congestion.  Patient reports that for the past 2 days she has been dealing with productive cough as well as increasing congestion and difficulty breathing.  She denies any associated fevers, chest pain, vomiting, diarrhea, abdominal pain, or dysuria.  Her young daughter has been sick with similar symptoms and she states she is not vaccinated against COVID-19.  She has been trying over-the-counter medications without relief.        Past Medical History:  Diagnosis Date   Allergy    seasonal, dusts   Herpes genitalis    MRSA infection    abscesses - history of    Patient Active Problem List   Diagnosis Date Noted   Cigar smoker 03/02/2020   Morbid obesity (HCC) 310 lbs 07/30/2018   Generalized abdominal pain    Herpes genitalis 02/21/2018   SIRS (systemic inflammatory response syndrome) (HCC) 03/28/2015   Leukocytosis 03/28/2015   Hypokalemia 03/28/2015    Past Surgical History:  Procedure Laterality Date   CHOLECYSTECTOMY, LAPAROSCOPIC     TONSILLECTOMY      Prior to Admission medications   Medication Sig Start Date End Date Taking? Authorizing Provider  albuterol (VENTOLIN HFA) 108 (90 Base) MCG/ACT inhaler Inhale 2 puffs into the lungs every 6 (six) hours as needed for wheezing or shortness of breath. 10/20/20  Yes Chesley Noon, MD  AMBULATORY NON FORMULARY MEDICATION 1 Device by Other route once a week. Blood Pressure Cuff Medium Monitored Regularly at home ICD 10:Z34.90 Patient not taking: No sig reported 08/31/18   Anyanwu,  Jethro Bastos, MD  doxycycline (VIBRA-TABS) 100 MG tablet Take 1 tablet (100 mg total) by mouth 2 (two) times daily. For 10 days. Take with full glass of water, stay upright 30 min after taking. Patient not taking: Reported on 06/30/2020 03/04/20   Smitty Cords, DO  fluticasone [facility]) 50 MCG/ACT nasal spray Place 2 sprays into both nostrils daily. Use for 4-6 weeks then stop and use seasonally or as needed. Patient not taking: Reported on 06/30/2020 03/04/20   Smitty Cords, DO  ibuprofen (ADVIL) 800 MG tablet Take 1 tablet (800 mg total) by mouth every 8 (eight) hours as needed for moderate pain or cramping. 03/04/20   Karamalegos, Netta Neat, DO  valACYclovir (VALTREX) 1000 MG tablet Take 1 tablet (1,000 mg total) by mouth daily. 08/19/20   Matt Holmes, PA    Allergies Patient has no known allergies.  Family History  Problem Relation Age of Onset   Bipolar disorder Mother    Heart disease Mother    Hypertension Sister    Diabetes Maternal Grandmother    Hypertension Maternal Grandmother     Social History Social History   Tobacco Use   Smoking status: Former    Pack years: 0.00    Types: Cigars   Smokeless tobacco: Never   Tobacco comments:    2-5 cigars per day on average, which pt uses to smoke marijuana  Vaping Use   Vaping Use: Never used  Substance Use Topics   Alcohol use: Not Currently   Drug  use: Not Currently    Types: Marijuana    Comment: 2-5 x per day    Review of Systems  Constitutional: No fever/chills Eyes: No visual changes. ENT: No sore throat.  Positive for congestion. Cardiovascular: Denies chest pain. Respiratory: Positive for cough and shortness of breath. Gastrointestinal: No abdominal pain.  No nausea, no vomiting.  No diarrhea.  No constipation. Genitourinary: Negative for dysuria. Musculoskeletal: Negative for back pain. Skin: Negative for rash. Neurological: Negative for headaches, focal weakness or  numbness.  ____________________________________________   PHYSICAL EXAM:  VITAL SIGNS: ED Triage Vitals [10/20/20 1336]  Enc Vitals Group     BP      Pulse      Resp      Temp      Temp src      SpO2      Weight (!) 309 lb 15.5 oz (140.6 kg)     Height 5\' 9"  (1.753 m)     Head Circumference      Peak Flow      Pain Score 7     Pain Loc      Pain Edu?      Excl. in GC?     Constitutional: Alert and oriented. Eyes: Conjunctivae are normal. Head: Atraumatic. Nose: No congestion/rhinnorhea. Mouth/Throat: Mucous membranes are moist. Neck: Normal ROM Cardiovascular: Normal rate, regular rhythm. Grossly normal heart sounds. Respiratory: Normal respiratory effort.  No retractions. Lungs CTAB. Gastrointestinal: Soft and nontender. No distention. Genitourinary: deferred Musculoskeletal: No lower extremity tenderness nor edema. Neurologic:  Normal speech and language. No gross focal neurologic deficits are appreciated. Skin:  Skin is warm, dry and intact. No rash noted. Psychiatric: Mood and affect are normal. Speech and behavior are normal.  ____________________________________________   LABS (all labs ordered are listed, but only abnormal results are displayed)  Labs Reviewed  RESP PANEL BY RT-PCR (FLU A&B, COVID) [id number]    PROCEDURES  Procedure(s) performed (including Critical Care):  Procedures   ____________________________________________   INITIAL IMPRESSION / ASSESSMENT AND PLAN / ED COURSE      25 year old female with no significant past medical history presents to the ED with cough, congestion, and shortness of breath for the past 2 days.  She is not in any respiratory distress and is maintaining O2 sats on room air.  Chest x-ray reviewed by me and shows no infiltrate, edema, or effusion.  Symptoms are consistent with viral syndrome, testing for influenza and COVID-19 is negative.  Patient requesting albuterol inhaler for symptomatic relief, she is  otherwise appropriate for discharge home with PCP follow-up.  She was counseled to return to the ED for new or worsening symptoms.  Patient agrees with plan.      ____________________________________________   FINAL CLINICAL IMPRESSION(S) / ED DIAGNOSES  Final diagnoses:  Viral syndrome     ED Discharge Orders          Ordered    albuterol (VENTOLIN HFA) 108 (90 Base) MCG/ACT inhaler  Every 6 hours PRN       Note to Pharmacy: Please supply with spacer   10/20/20 1643             Note:  This document was prepared using Dragon voice recognition software and may include unintentional dictation errors.    12/21/20, MD 10/20/20 [phone]

## 2020-10-20 NOTE — ED Triage Notes (Signed)
C/o cough, sore throat, fever x 2 days.  AAOx3.  Skin warm and dry. NAD

## 2020-10-22 ENCOUNTER — Other Ambulatory Visit: Payer: Self-pay

## 2020-10-22 ENCOUNTER — Emergency Department
Admission: EM | Admit: 2020-10-22 | Discharge: 2020-10-22 | Disposition: A | Discharge status: Home / Self Care | Payer: Medicaid Other | Attending: Emergency Medicine | Admitting: Emergency Medicine

## 2020-10-22 ENCOUNTER — Encounter: Payer: Self-pay | Admitting: Emergency Medicine

## 2020-10-22 DIAGNOSIS — Z79899 Other long term (current) drug therapy: Secondary | ICD-10-CM | POA: Diagnosis not present

## 2020-10-22 DIAGNOSIS — R112 Nausea with vomiting, unspecified: Secondary | ICD-10-CM | POA: Diagnosis present

## 2020-10-22 DIAGNOSIS — Z87891 Personal history of nicotine dependence: Secondary | ICD-10-CM | POA: Diagnosis not present

## 2020-10-22 DIAGNOSIS — R1013 Epigastric pain: Secondary | ICD-10-CM

## 2020-10-22 DIAGNOSIS — K529 Noninfective gastroenteritis and colitis, unspecified: Secondary | ICD-10-CM

## 2020-10-22 DIAGNOSIS — K5289 Other specified noninfective gastroenteritis and colitis: Secondary | ICD-10-CM | POA: Diagnosis not present

## 2020-10-22 LAB — COMPREHENSIVE METABOLIC PANEL
ALT: 22 U/L (ref 0–44)
AST: 24 U/L (ref 15–41)
Albumin: 3.9 g/dL (ref 3.5–5.0)
Alkaline Phosphatase: 79 U/L (ref 38–126)
Anion gap: 11 (ref 5–15)
BUN: 10 mg/dL (ref 6–20)
CO2: 23 mmol/L (ref 22–32)
Calcium: 9 mg/dL (ref 8.9–10.3)
Chloride: 106 mmol/L (ref 98–111)
Creatinine, Ser: 0.51 mg/dL (ref 0.44–1.00)
GFR, Estimated: >60 mL/min (ref >60)
Glucose, Bld: 119 mg/dL — ABNORMAL HIGH (ref 70–99)
Potassium: 3.5 mmol/L (ref 3.5–5.1)
Sodium: 140 mmol/L (ref 135–145)
Total Bilirubin: 0.7 mg/dL (ref 0.3–1.2)
Total Protein: 7.5 g/dL (ref 6.5–8.1)

## 2020-10-22 LAB — CBC
HCT: 43.3 % (ref 36.0–46.0)
Hemoglobin: 14.5 g/dL (ref 12.0–15.0)
MCH: 29.4 pg (ref 26.0–34.0)
MCHC: 33.5 g/dL (ref 30.0–36.0)
MCV: 87.7 fL (ref 80.0–100.0)
Platelets: 322 10*3/uL (ref 150–400)
RBC: 4.94 MIL/uL (ref 3.87–5.11)
RDW: 13.1 % (ref 11.5–15.5)
WBC: 7.5 10*3/uL (ref 4.0–10.5)
nRBC: 0 % (ref 0.0–0.2)

## 2020-10-22 LAB — LIPASE, BLOOD: Lipase: 21 U/L (ref 11–51)

## 2020-10-22 MED ORDER — SODIUM CHLORIDE 0.9 % IV BOLUS: 1000.0000 mL | Freq: Once | INTRAVENOUS | Status: DC

## 2020-10-22 MED ORDER — SUCRALFATE 1 GM/10ML PO SUSP: 1.0000 g | Freq: Four times a day (QID) | ORAL | 0 refills | Status: DC

## 2020-10-22 MED ORDER — ONDANSETRON 4 MG PO TBDP: 4.0000 mg | ORAL_TABLET | Freq: Three times a day (TID) | ORAL | 0 refills | Status: DC | PRN

## 2020-10-22 MED ORDER — ONDANSETRON HCL 4 MG/2ML IJ SOLN
4.0000 mg | Freq: Once | INTRAMUSCULAR | Status: AC
  Administered 2020-10-22: 4 mg via INTRAVENOUS
  Filled 2020-10-22: qty 2

## 2020-10-22 MED ORDER — SODIUM CHLORIDE 0.9 % IV BOLUS
1000.0000 mL | Freq: Once | INTRAVENOUS | Status: AC
  Administered 2020-10-22: 1000 mL via INTRAVENOUS

## 2020-10-22 MED ORDER — DROPERIDOL 2.5 MG/ML IJ SOLN
2.5000 mg | Freq: Once | INTRAMUSCULAR | Status: AC
  Administered 2020-10-22: 2.5 mg via INTRAVENOUS
  Filled 2020-10-22: qty 2

## 2020-10-22 NOTE — ED Triage Notes (Signed)
Pt comes into the ED via [facility] from home c/o N/V x 2 days.  Pt seen here 2 days ago for the same thing and dx with viral syndrome.  Pt c/o epigastric pain that has tenderness with palpation.,  20g L hand, 4mg  zofran. Pt denies any other complaints at this time.  Pt states her daughter is sick with the same thing.  Pt in NAD at this time with even and unlabored respirations.  CBG 100, 96 %, afebrile, 85 HR, 150 systolic.

## 2020-10-22 NOTE — ED Notes (Signed)
Pt requested apple juice for PO challenge instead of Ginger ale. Graham crackers provided too.

## 2020-10-26 NOTE — ED Provider Notes (Signed)
[facility] Emergency Department Provider Note   ____________________________________________   Event Date/Time   First MD Initiated Contact with Patient 10/22/20 0732     (approximate)  I have reviewed the triage vital signs and the nursing notes.   HISTORY  Chief Complaint Emesis    HPI Angela Flynn is a 25 y.o. female with below stated past medical history presents for nausea and vomiting as well as pain in the epigastric region  LOCATION: Epigastric DURATION: 2 days TIMING: Worsening since onset SEVERITY: 8/10 QUALITY: Burning pain CONTEXT: Patient states that the symptoms started 2 days ago and was seen at that time where she was diagnosed with viral syndrome.  Since that time she has felt increasing epigastric pain with worsening tenderness to palpation MODIFYING FACTORS: Any p.o. intake worsens this pain and she denies any relieving factors ASSOCIATED SYMPTOMS: denies   Per medical record review, pt has long history of morbid obesity          Past Medical History:  Diagnosis Date   Allergy    seasonal, dusts   Herpes genitalis    MRSA infection    abscesses - history of    Patient Active Problem List   Diagnosis Date Noted   Cigar smoker 03/02/2020   Morbid obesity (HCC) 310 lbs 07/30/2018   Generalized abdominal pain    Herpes genitalis 02/21/2018   SIRS (systemic inflammatory response syndrome) (HCC) 03/28/2015   Leukocytosis 03/28/2015   Hypokalemia 03/28/2015    Past Surgical History:  Procedure Laterality Date   CHOLECYSTECTOMY, LAPAROSCOPIC     TONSILLECTOMY      Prior to Admission medications   Medication Sig Start Date End Date Taking? Authorizing Provider  ondansetron (ZOFRAN ODT) 4 MG disintegrating tablet Take 1 tablet (4 mg total) by mouth every 8 (eight) hours as needed for nausea or vomiting. 10/22/20  Yes Jammy Plotkin, Clent Jacks, MD  sucralfate (CARAFATE) 1 GM/10ML suspension Take 10 mLs (1 g total) by  mouth 4 (four) times daily for 10 days. 10/22/20 11/01/20 Yes Mekhia Brogan, Clent Jacks, MD  albuterol (VENTOLIN HFA) 108 (90 Base) MCG/ACT inhaler Inhale 2 puffs into the lungs every 6 (six) hours as needed for wheezing or shortness of breath. 10/20/20   Chesley Noon, MD  AMBULATORY NON FORMULARY MEDICATION 1 Device by Other route once a week. Blood Pressure Cuff Medium Monitored Regularly at home ICD 10:Z34.90 Patient not taking: No sig reported 08/31/18   Anyanwu, Jethro Bastos, MD  doxycycline (VIBRA-TABS) 100 MG tablet Take 1 tablet (100 mg total) by mouth 2 (two) times daily. For 10 days. Take with full glass of water, stay upright 30 min after taking. Patient not taking: Reported on 06/30/2020 03/04/20   Smitty Cords, DO  fluticasone [facility]) 50 MCG/ACT nasal spray Place 2 sprays into both nostrils daily. Use for 4-6 weeks then stop and use seasonally or as needed. Patient not taking: Reported on 06/30/2020 03/04/20   Smitty Cords, DO  ibuprofen (ADVIL) 800 MG tablet Take 1 tablet (800 mg total) by mouth every 8 (eight) hours as needed for moderate pain or cramping. 03/04/20   Karamalegos, Netta Neat, DO  valACYclovir (VALTREX) 1000 MG tablet Take 1 tablet (1,000 mg total) by mouth daily. 08/19/20   Matt Holmes, PA    Allergies Patient has no known allergies.  Family History  Problem Relation Age of Onset   Bipolar disorder Mother    Heart disease Mother    Hypertension Sister  Diabetes Maternal Grandmother    Hypertension Maternal Grandmother     Social History Social History   Tobacco Use   Smoking status: Former    Types: Cigars   Smokeless tobacco: Never   Tobacco comments:    2-5 cigars per day on average, which pt uses to smoke marijuana  Vaping Use   Vaping Use: Never used  Substance Use Topics   Alcohol use: Not Currently   Drug use: Not Currently    Types: Marijuana    Comment: 2-5 x per day    Review of Systems Constitutional: No  fever/chills Eyes: No visual changes. ENT: No sore throat. Cardiovascular: Denies chest pain. Respiratory: Denies shortness of breath. Gastrointestinal: Endorses epigastric abdominal pain.  Endorses nausea/vomiting.  No diarrhea. Genitourinary: Negative for dysuria. Musculoskeletal: Negative for acute arthralgias Skin: Negative for rash. Neurological: Negative for headaches, weakness/numbness/paresthesias in any extremity Psychiatric: Negative for suicidal ideation/homicidal ideation   ____________________________________________   PHYSICAL EXAM:  VITAL SIGNS: ED Triage Vitals  Enc Vitals Group     BP 10/22/20 0734 (!) 134/115     Pulse Rate 10/22/20 0734 82     Resp 10/22/20 0734 19     Temp 10/22/20 0734 98.1 F (36.7 C)     Temp Source 10/22/20 0734 Oral     SpO2 10/22/20 0734 98 %     Weight 10/22/20 0732 (!) 309 lb 15.5 oz (140.6 kg)     Height 10/22/20 0732 5\' 9"  (1.753 m)     Head Circumference --      Peak Flow --      Pain Score 10/22/20 0732 8     Pain Loc --      Pain Edu? --      Excl. in GC? --    Constitutional: Alert and oriented. Well appearing and in no acute distress. Eyes: Conjunctivae are normal. PERRL. Head: Atraumatic. Nose: No congestion/rhinnorhea. Mouth/Throat: Mucous membranes are moist. Neck: No stridor Cardiovascular: Grossly normal heart sounds.  Good peripheral circulation. Respiratory: Normal respiratory effort.  No retractions. Gastrointestinal: Soft and mild epigastric tenderness to palpation. No distention. Musculoskeletal: No obvious deformities Neurologic:  Normal speech and language. No gross focal neurologic deficits are appreciated. Skin:  Skin is warm and dry. No rash noted. Psychiatric: Mood and affect are normal. Speech and behavior are normal.  ____________________________________________   LABS (all labs ordered are listed, but only abnormal results are displayed)  Labs Reviewed  COMPREHENSIVE METABOLIC PANEL -  Abnormal; Notable for the following components:      Result Value   Glucose, Bld 119 (*)    All other components within normal limits  LIPASE, BLOOD  CBC   ____________________________________________  EKG  ED ECG REPORT I, 10/24/20, the attending physician, personally viewed and interpreted this ECG.  Date: 10/26/2020 EKG Time: 0734 Rate: 81 Rhythm: normal sinus rhythm QRS Axis: normal Intervals: normal ST/T Wave abnormalities: normal Narrative Interpretation: no evidence of acute ischemia   PROCEDURES  Procedure(s) performed (including Critical Care):  .1-3 Lead EKG Interpretation  Date/Time: 10/27/2020 4:26 PM Performed by: 10/29/2020, MD Authorized by: Merwyn Katos, MD     Interpretation: normal     ECG rate:  67   ECG rate assessment: normal     Rhythm: sinus rhythm     Ectopy: none     Conduction: normal     ____________________________________________   INITIAL IMPRESSION / ASSESSMENT AND PLAN / ED COURSE  As part of my medical  decision making, I reviewed the following data within the electronic medical record, if available:  Nursing notes reviewed and incorporated, Labs reviewed, EKG interpreted, Old chart reviewed, Radiograph reviewed and Notes from prior ED visits reviewed and incorporated        Patient presents for acute nausea/vomiting The cause of the patients symptoms is not clear, but the patient is overall well appearing and is suspected to have a transient course of illness.  Given History and Exam there does not appear to be an emergent cause of the symptoms such as small bowel obstruction, coronary syndrome, bowel ischemia, DKA, pancreatitis, appendicitis, other acute abdomen or other emergent problem.  Reassessment: After treatment, the patient is feeling much better, tolerating PO fluids, and shows no signs of dehydration.   Disposition: Discharge home with prompt primary care physician follow up in the next 48 hours.  Strict return precautions discussed.      ____________________________________________   FINAL CLINICAL IMPRESSION(S) / ED DIAGNOSES  Final diagnoses:  Non-intractable vomiting with nausea, unspecified vomiting type  Epigastric pain  Gastroenteritis     ED Discharge Orders          Ordered    sucralfate (CARAFATE) 1 GM/10ML suspension  4 times daily        10/22/20 1055    ondansetron (ZOFRAN ODT) 4 MG disintegrating tablet  Every 8 hours PRN        10/22/20 1055             Note:  This document was prepared using Dragon voice recognition software and may include unintentional dictation errors.    Merwyn Katos, MD 10/27/20 1626

## 2020-11-10 ENCOUNTER — Encounter: Payer: Self-pay | Admitting: Family Medicine

## 2020-11-10 ENCOUNTER — Telehealth (INDEPENDENT_AMBULATORY_CARE_PROVIDER_SITE_OTHER): Payer: Medicaid Other | Admitting: Family Medicine

## 2020-11-10 DIAGNOSIS — N946 Dysmenorrhea, unspecified: Secondary | ICD-10-CM | POA: Diagnosis not present

## 2020-11-10 DIAGNOSIS — J0110 Acute frontal sinusitis, unspecified: Secondary | ICD-10-CM

## 2020-11-10 DIAGNOSIS — R233 Spontaneous ecchymoses: Secondary | ICD-10-CM

## 2020-11-10 DIAGNOSIS — R238 Other skin changes: Secondary | ICD-10-CM

## 2020-11-10 DIAGNOSIS — L853 Xerosis cutis: Secondary | ICD-10-CM

## 2020-11-10 MED ORDER — IBUPROFEN 800 MG PO TABS: 800.0000 mg | ORAL_TABLET | Freq: Three times a day (TID) | ORAL | 2 refills | Status: DC | PRN

## 2020-11-10 MED ORDER — IPRATROPIUM BROMIDE 0.06 % NA SOLN: 2.0000 | Freq: Four times a day (QID) | NASAL | 0 refills | Status: DC

## 2020-11-10 MED ORDER — PREDNISONE 10 MG PO TABS: ORAL_TABLET | ORAL | 0 refills | Status: DC

## 2020-11-10 MED ORDER — CLOTRIMAZOLE-BETAMETHASONE 1-0.05 % EX CREA: TOPICAL_CREAM | CUTANEOUS | 2 refills | Status: DC

## 2020-11-10 MED ORDER — AMOXICILLIN-POT CLAVULANATE 875-125 MG PO TABS: 1.0000 | ORAL_TABLET | Freq: Two times a day (BID) | ORAL | 0 refills | Status: DC

## 2020-11-10 NOTE — Progress Notes (Signed)
Virtual Visit via Telephone The purpose of this virtual visit is to provide medical care while limiting exposure to the novel coronavirus (COVID19) for both patient and office staff.  Consent was obtained for phone visit:  Yes.   Answered questions that patient had about telehealth interaction:  Yes.   I discussed the limitations, risks, security and privacy concerns of performing an evaluation and management service by telephone. I also discussed with the patient that there may be a patient responsible charge related to this service. The patient expressed understanding and agreed to proceed.  Patient Location: Home Provider Location: [facility] (Office)  Participants in virtual visit: - Patient: Angela Flynn - CMA: Orinda Kenner, CMA - Provider: Dr Parks Ranger  ---------------------------------------------------------------------- Chief Complaint  Patient presents with   Sinusitis   Sore Throat   Patient presents for a same day appointment.  PCP previously Cassell Smiles, AGPCNP-BC  S: Reviewed CMA documentation. I have called patient and gathered additional HPI as follows:  Sinusitis / Sore Throat  History of respiratory viral illness 10/2020, ED visit had negative flu and covid testing Completed course of Bactrim recently in July 2022 Reports that symptoms started 4 days ago with sore throat, some difficulty with breathing in some tightness in chest, productive green cough, using albuterol PRN no relief - Daughter has runny nose recently COVID19 test - last week Friday negative.  Spontaneous Bruising > 3 months various places on legs with painful bruises - Easier Nose bleeding, gum bleeding at times with brushing teeth She always had easy bruising but now this is more spontaneous  Dry Skin Dermatitis Feet  Reports worse after pregnancy, between toes and across sole of foot with dry irritated skin  Denies any known or suspected exposure to person  with or possibly with COVID19.  Denies any fevers, chills, sweats, body ache, abdominal pain, diarrhea  Past Medical History:  Diagnosis Date   Allergy    seasonal, dusts   Herpes genitalis    MRSA infection    abscesses - history of   Social History   Tobacco Use   Smoking status: Former    Types: Cigars   Smokeless tobacco: Never   Tobacco comments:    2-5 cigars per day on average, which pt uses to smoke marijuana  Vaping Use   Vaping Use: Never used  Substance Use Topics   Alcohol use: Not Currently   Drug use: Not Currently    Types: Marijuana    Comment: 2-5 x per day    Current Outpatient Medications:    acyclovir (ZOVIRAX) 400 MG tablet, Take 400 mg by mouth 2 (two) times daily., Disp: , Rfl:    albuterol (VENTOLIN HFA) 108 (90 Base) MCG/ACT inhaler, Inhale 2 puffs into the lungs every 6 (six) hours as needed for wheezing or shortness of breath., Disp: 8 g, Rfl: 0   amoxicillin-clavulanate (AUGMENTIN) 875-125 MG tablet, Take 1 tablet by mouth 2 (two) times daily., Disp: 20 tablet, Rfl: 0   clotrimazole-betamethasone (LOTRISONE) cream, Apply 1-2 times a day for worsening flare dry skin dermatitis of toes/feet, may re-use daily up to 1 week as needed., Disp: 30 g, Rfl: 2   ipratropium (ATROVENT) 0.06 % nasal spray, Place 2 sprays into both nostrils 4 (four) times daily. For up to 5-7 days then stop., Disp: 15 mL, Rfl: 0   ondansetron (ZOFRAN ODT) 4 MG disintegrating tablet, Take 1 tablet (4 mg total) by mouth every 8 (eight) hours as needed for  nausea or vomiting., Disp: 20 tablet, Rfl: 0   predniSONE (DELTASONE) 10 MG tablet, Take 6 tabs with breakfast Day 1, 5 tabs Day 2, 4 tabs Day 3, 3 tabs Day 4, 2 tabs Day 5, 1 tab Day 6. NOTE Do not take Ibuprofen while on Prednisone, Disp: 21 tablet, Rfl: 0   valACYclovir (VALTREX) 1000 MG tablet, Take 1 tablet (1,000 mg total) by mouth daily., Disp: 30 tablet, Rfl: 12   AMBULATORY NON FORMULARY MEDICATION, 1 Device by Other route  once a week. Blood Pressure Cuff Medium Monitored Regularly at home ICD 10:Z34.90 (Patient not taking: No sig reported), Disp: 1 kit, Rfl: 0   fluticasone (FLONASE) 50 MCG/ACT nasal spray, Place 2 sprays into both nostrils daily. Use for 4-6 weeks then stop and use seasonally or as needed. (Patient not taking: No sig reported), Disp: 16 g, Rfl: 3   ibuprofen (ADVIL) 800 MG tablet, Take 1 tablet (800 mg total) by mouth every 8 (eight) hours as needed for moderate pain or cramping., Disp: 90 tablet, Rfl: 2   sucralfate (CARAFATE) 1 GM/10ML suspension, Take 10 mLs (1 g total) by mouth 4 (four) times daily for 10 days., Disp: 400 mL, Rfl: 0  Depression screen [facility] 2/9 08/31/2018 02/21/2018  Decreased Interest 3 0  Down, Depressed, Hopeless 0 0  PHQ - 2 Score 3 0  Altered sleeping 3 3  Tired, decreased energy 3 3  Change in appetite 0 3  Feeling bad or failure about yourself  1 0  Trouble concentrating 0 3  Moving slowly or fidgety/restless 0 0  Suicidal thoughts 0 0  PHQ-9 Score 10 12  Difficult doing work/chores - Extremely dIfficult    GAD 7 : Generalized Anxiety Score 08/31/2018  Nervous, Anxious, on Edge 3  Control/stop worrying 3  Worry too much - different things 3  Trouble relaxing 3  Restless 0  Easily annoyed or irritable 3  Afraid - awful might happen 0  Total GAD 7 Score 15    -------------------------------------------------------------------------- O: No physical exam performed due to remote telephone encounter.  Lab results reviewed.  DG Chest 2 ViewPerformed 10/20/2020 Final result  Study Result CLINICAL DATA: Short of breath  EXAM: CHEST - 2 VIEW  COMPARISON: None.  FINDINGS: Normal mediastinum and cardiac silhouette. Normal pulmonary vasculature. No evidence of effusion, infiltrate, or pneumothorax. No acute bony abnormality.  IMPRESSION: No acute cardiopulmonary process.   Electronically Signed By: Suzy Bouchard M.D. On: 10/20/2020  16:14    Recent Results (from the past 2160 hour(s))  WET PREP FOR TRICH, YEAST, CLUE     Status: None   Collection Time: 08/19/20 12:00 AM  Result Value Ref Range   Trichomonas Exam Negative Negative   Yeast Exam Negative Negative   Clue Cell Exam Comment: Negative    Comment: NEG;AMINE NEG  Resp Panel by RT-PCR (Flu A&B, Covid) Nasopharyngeal Swab     Status: None   Collection Time: 10/20/20  3:22 PM   Specimen: Nasopharyngeal Swab; Nasopharyngeal(NP) swabs in vial transport medium  Result Value Ref Range   SARS Coronavirus 2 by RT PCR NEGATIVE NEGATIVE    Comment: (NOTE) SARS-CoV-2 target nucleic acids are NOT DETECTED.  The SARS-CoV-2 RNA is generally detectable in upper respiratory specimens during the acute phase of infection. The lowest concentration of SARS-CoV-2 viral copies this assay can detect is 138 copies/mL. A negative result does not preclude SARS-Cov-2 infection and should not be used as the sole basis for treatment or other patient  management decisions. A negative result may occur with  improper specimen collection/handling, submission of specimen other than nasopharyngeal swab, presence of viral mutation(s) within the areas targeted by this assay, and inadequate number of viral copies(<138 copies/mL). A negative result must be combined with clinical observations, patient history, and epidemiological information. The expected result is Negative.  Fact Sheet for Patients:  EntrepreneurPulse.com.au  Fact Sheet for Healthcare Providers:  IncredibleEmployment.be  This test is no t yet approved or cleared by the [place] FDA and  has been authorized for detection and/or diagnosis of SARS-CoV-2 by FDA under an Emergency Use Authorization (EUA). This EUA will remain  in effect (meaning this test can be used) for the duration of the COVID-19 declaration under Section 564(b)(1) of the Act, 21 U.S.C.section 360bbb-3(b)(1),  unless the authorization is terminated  or revoked sooner.       Influenza A by PCR NEGATIVE NEGATIVE   Influenza B by PCR NEGATIVE NEGATIVE    Comment: (NOTE) The Xpert Xpress SARS-CoV-2/FLU/RSV plus assay is intended as an aid in the diagnosis of influenza from Nasopharyngeal swab specimens and should not be used as a sole basis for treatment. Nasal washings and aspirates are unacceptable for Xpert Xpress SARS-CoV-2/FLU/RSV testing.  Fact Sheet for Patients: EntrepreneurPulse.com.au  Fact Sheet for Healthcare Providers: IncredibleEmployment.be  This test is not yet approved or cleared by the [place] FDA and has been authorized for detection and/or diagnosis of SARS-CoV-2 by FDA under an Emergency Use Authorization (EUA). This EUA will remain in effect (meaning this test can be used) for the duration of the COVID-19 declaration under Section 564(b)(1) of the Act, 21 U.S.C. section 360bbb-3(b)(1), unless the authorization is terminated or revoked.  Performed at [facility], [address] [place], [place] [address]   Lipase, blood     Status: None   Collection Time: 10/22/20  7:34 AM  Result Value Ref Range   Lipase 21 11 - 51 U/L    Comment: Performed at [facility], [address] [place], [place] [address]  Comprehensive metabolic panel     Status: Abnormal   Collection Time: 10/22/20  7:34 AM  Result Value Ref Range   Sodium 140 135 - 145 mmol/L   Potassium 3.5 3.5 - 5.1 mmol/L   Chloride 106 98 - 111 mmol/L   CO2 23 22 - 32 mmol/L   Glucose, Bld 119 (H) 70 - 99 mg/dL    Comment: Glucose reference range applies only to samples taken after fasting for at least 8 hours.   BUN 10 6 - 20 mg/dL   Creatinine, Ser 0.51 0.44 - 1.00 mg/dL   Calcium 9.0 8.9 - 10.3 mg/dL   Total Protein 7.5 6.5 - 8.1 g/dL   Albumin 3.9 3.5 - 5.0 g/dL   AST 24 15 - 41 U/L   ALT 22 0 - 44 U/L   Alkaline Phosphatase 79 38 - 126  U/L   Total Bilirubin 0.7 0.3 - 1.2 mg/dL   GFR, Estimated >60 >60 mL/min    Comment: (NOTE) Calculated using the CKD-EPI Creatinine Equation (2021)    Anion gap 11 5 - 15    Comment: Performed at [facility], [address] [place], [place] [address]  CBC     Status: None   Collection Time: 10/22/20  7:34 AM  Result Value Ref Range   WBC 7.5 4.0 - 10.5 K/uL   RBC 4.94 3.87 - 5.11 MIL/uL   Hemoglobin 14.5 12.0 -  15.0 g/dL   HCT 43.3 36.0 - 46.0 %   MCV 87.7 80.0 - 100.0 fL   MCH 29.4 26.0 - 34.0 pg   MCHC 33.5 30.0 - 36.0 g/dL   RDW 13.1 11.5 - 15.5 %   Platelets 322 150 - 400 K/uL   nRBC 0.0 0.0 - 0.2 %    Comment: Performed at [facility], [address], [place],  [address]    -------------------------------------------------------------------------- A&P:  Problem List Items Addressed This Visit   None Visit Diagnoses     Acute non-recurrent frontal sinusitis    -  Primary   Relevant Medications   acyclovir (ZOVIRAX) 400 MG tablet   predniSONE (DELTASONE) 10 MG tablet   amoxicillin-clavulanate (AUGMENTIN) 875-125 MG tablet   ipratropium (ATROVENT) 0.06 % nasal spray   Menstrual cramps       Relevant Medications   ibuprofen (ADVIL) 800 MG tablet   Easy bruising       Relevant Orders   Ambulatory referral to Hematology / Oncology   Spontaneous bruising       Relevant Orders   Ambulatory referral to Hematology / Oncology   Dry skin dermatitis       Relevant Medications   clotrimazole-betamethasone (LOTRISONE) cream      Sinusitis Cannot rule out covid, had negative test last week Friday Had ED visit 10/2020 negative covid and flu  Completed bactrim, now recurrence of symptoms Will trial Augmentin, Prednisone taper Start Atrovent nasal spray decongestant 2 sprays in each nostril up to 4 times daily for 7 days Use albuterol PRN  Dry skin dermatitis / possible Tinea pedis Trial on topical therapy  Referral to  Hematology spontaneous bruising lower extremities, painful bruises, >3 months, she has had easy bleeding with gums and nosebleeds as well with blowing nose. Last CBC in 10/2020 at ED was normal. She requests further evaluation   Orders Placed This Encounter  Procedures   Ambulatory referral to Hematology / Oncology    Referral Priority:   Routine    Referral Reason:   Specialty Services Required    Requested Specialty:   Oncology    Number of Visits Requested:   1     Meds ordered this encounter  Medications   predniSONE (DELTASONE) 10 MG tablet    Sig: Take 6 tabs with breakfast Day 1, 5 tabs Day 2, 4 tabs Day 3, 3 tabs Day 4, 2 tabs Day 5, 1 tab Day 6. NOTE Do not take Ibuprofen while on Prednisone    Dispense:  21 tablet    Refill:  0   ibuprofen (ADVIL) 800 MG tablet    Sig: Take 1 tablet (800 mg total) by mouth every 8 (eight) hours as needed for moderate pain or cramping.    Dispense:  90 tablet    Refill:  2   amoxicillin-clavulanate (AUGMENTIN) 875-125 MG tablet    Sig: Take 1 tablet by mouth 2 (two) times daily.    Dispense:  20 tablet    Refill:  0   ipratropium (ATROVENT) 0.06 % nasal spray    Sig: Place 2 sprays into both nostrils 4 (four) times daily. For up to 5-7 days then stop.    Dispense:  15 mL    Refill:  0   clotrimazole-betamethasone (LOTRISONE) cream    Sig: Apply 1-2 times a day for worsening flare dry skin dermatitis of toes/feet, may re-use daily up to 1 week as needed.    Dispense:  30 g  Refill:  2    Follow-up: - Return PRN  Patient verbalizes understanding with the above medical recommendations including the limitation of remote medical advice.  Specific follow-up and call-back criteria were given for patient to follow-up or seek medical care more urgently if needed.   - Time spent in direct consultation with patient on phone: 15 minutes   Nobie Putnam, [facility] 11/10/2020, 4:42  PM

## 2020-11-10 NOTE — Patient Instructions (Addendum)
Start Augmentin antibiotic Start Atrovent nasal spray decongestant 2 sprays in each nostril up to 4 times daily for 7 days Take Prednisone for cough and breathing and sinuses HOLD Ibuprofen while on prednisone, cannot take both  Referral to Hematology stay tuned for apt  Topical for feet  Please schedule a Follow-up Appointment to: No follow-ups on file.  If you have any other questions or concerns, please feel free to call the office or send a message through MyChart. You may also schedule an earlier appointment if necessary.  Additionally, you may be receiving a survey about your experience at our office within a few days to 1 week by e-mail or mail. We value your feedback.  Saralyn Pilar, DO [facility], [place]

## 2020-11-18 ENCOUNTER — Inpatient Hospital Stay: Payer: Medicaid Other

## 2020-11-18 ENCOUNTER — Inpatient Hospital Stay: Payer: Medicaid Other | Admitting: Oncology

## 2020-11-18 DIAGNOSIS — R233 Spontaneous ecchymoses: Secondary | ICD-10-CM | POA: Insufficient documentation

## 2020-11-18 DIAGNOSIS — R238 Other skin changes: Secondary | ICD-10-CM | POA: Insufficient documentation

## 2020-11-19 ENCOUNTER — Other Ambulatory Visit: Payer: Self-pay

## 2020-11-19 ENCOUNTER — Emergency Department
Admission: EM | Admit: 2020-11-19 | Discharge: 2020-11-19 | Disposition: A | Discharge status: Home / Self Care | Payer: Medicaid Other | Attending: Emergency Medicine | Admitting: Emergency Medicine

## 2020-11-19 DIAGNOSIS — T162XXA Foreign body in left ear, initial encounter: Secondary | ICD-10-CM | POA: Insufficient documentation

## 2020-11-19 DIAGNOSIS — Z5321 Procedure and treatment not carried out due to patient leaving prior to being seen by health care provider: Secondary | ICD-10-CM | POA: Diagnosis not present

## 2020-11-19 DIAGNOSIS — X58XXXA Exposure to other specified factors, initial encounter: Secondary | ICD-10-CM | POA: Diagnosis not present

## 2020-11-19 NOTE — ED Triage Notes (Signed)
Patient reports moth flew into her left ear about 3 hours ago. Patient reports several attempts to remove moth at home without success.

## 2021-01-07 ENCOUNTER — Other Ambulatory Visit: Payer: Self-pay

## 2021-01-07 ENCOUNTER — Other Ambulatory Visit (HOSPITAL_COMMUNITY)
Admission: RE | Admit: 2021-01-07 | Discharge: 2021-01-07 | Disposition: A | Discharge status: Home / Self Care | Payer: Medicaid Other | Source: Ambulatory Visit | Attending: Internal Medicine | Admitting: Internal Medicine

## 2021-01-07 ENCOUNTER — Ambulatory Visit: Payer: Medicaid Other | Admitting: Internal Medicine

## 2021-01-07 ENCOUNTER — Encounter: Payer: Self-pay | Admitting: Internal Medicine

## 2021-01-07 VITALS — BP 114/70 | HR 68 | Temp 97.5°F | Resp 17 | Ht 69.0 in | Wt 270.0 lb

## 2021-01-07 DIAGNOSIS — B353 Tinea pedis: Secondary | ICD-10-CM

## 2021-01-07 DIAGNOSIS — R350 Frequency of micturition: Secondary | ICD-10-CM

## 2021-01-07 DIAGNOSIS — R3915 Urgency of urination: Secondary | ICD-10-CM

## 2021-01-07 DIAGNOSIS — R238 Other skin changes: Secondary | ICD-10-CM | POA: Diagnosis not present

## 2021-01-07 DIAGNOSIS — Z6839 Body mass index (BMI) 39.0-39.9, adult: Secondary | ICD-10-CM

## 2021-01-07 DIAGNOSIS — R233 Spontaneous ecchymoses: Secondary | ICD-10-CM

## 2021-01-07 DIAGNOSIS — N898 Other specified noninflammatory disorders of vagina: Secondary | ICD-10-CM

## 2021-01-07 DIAGNOSIS — B351 Tinea unguium: Secondary | ICD-10-CM

## 2021-01-07 DIAGNOSIS — Z113 Encounter for screening for infections with a predominantly sexual mode of transmission: Secondary | ICD-10-CM

## 2021-01-07 MED ORDER — CICLOPIROX OLAMINE 0.77 % EX SUSP: 1.0000 "application " | Freq: Two times a day (BID) | CUTANEOUS | 1 refills | Status: DC

## 2021-01-07 NOTE — Patient Instructions (Signed)
Safe Sex Practicing safe sex means taking steps before and during sex to reduce your risk of: Getting an STI (sexually transmitted infection). Giving your partner an STI. Unwanted or unplanned pregnancy. How to practice safe sex Ways you can practice safe sex  Limit your sexual partners to only one partner who is having sex with only you. Avoid using alcohol and drugs before having sex. Alcohol and drugs can affect your judgment. Before having sex with a new partner: Talk to your partner about past partners, past STIs, and drug use. Get screened for STIs and discuss the results with your partner. Ask your partner to get screened too. Check your body regularly for sores, blisters, rashes, or unusual discharge. If you notice any of these problems, visit your health care provider. Avoid sexual contact if you have symptoms of an infection or you are being treated for an STI. While having sex, use a condom. Make sure to: Use a condom every time you have vaginal, oral, or anal sex. Both females and males should wear condoms during oral sex. Keep condoms in place from the beginning to the end of sexual activity. Use a latex condom, if possible. Latex condoms offer the best protection. Use only water-based lubricants with a condom. Using petroleum-based lubricants or oils will weaken the condom and increase the chance that it will break. Ways your health care provider can help you practice safe sex  See your health care provider for regular screenings, exams, and tests for STIs. Talk with your health care provider about what kind of birth control (contraception) is best for you. Get vaccinated against hepatitis B and human papillomavirus (HPV). If you are at risk of being infected with HIV (human immunodeficiency virus), talk with your health care provider about taking a prescription medicine to prevent HIV infection. You are at risk for HIV if you: Are a man who has sex with other men. Are  sexually active with more than one partner. Take drugs by injection. Have a sex partner who has HIV. Have unprotected sex. Have sex with someone who has sex with both men and women. Have had an STI. Follow these instructions at home: Take over-the-counter and prescription medicines only as told by your health care provider. Keep all follow-up visits. This is important. Where to find more information Centers for Disease Control and Prevention: www.cdc.gov Planned Parenthood: www.plannedparenthood.org Office on Women's Health: www.womenshealth.gov Summary Practicing safe sex means taking steps before and during sex to reduce your risk getting an STI, giving your partner an STI, and having an unwanted or unplanned pregnancy. Before having sex with a new partner, talk to your partner about past partners, past STIs, and drug use. Use a condom every time you have vaginal, oral, or anal sex. Both females and males should wear condoms during oral sex. Check your body regularly for sores, blisters, rashes, or unusual discharge. If you notice any of these problems, visit your health care provider. See your health care provider for regular screenings, exams, and tests for STIs. This information is not intended to replace advice given to you by your health care provider. Make sure you discuss any questions you have with your health care provider. Document Revised: 09/02/2019 Document Reviewed: 09/02/2019 Elsevier Patient Education  2022 [employer].  

## 2021-01-07 NOTE — Progress Notes (Signed)
Subjective:    Patient ID: Angela Flynn, female    DOB: 27-Jul-1995, 27 y.o.   MRN: [id number]  HPI  Patient presents to clinic today requesting STD screen. She reports some urinary frequency, urgency but denies dysuria or blood in her urine. She also reports vaginal discharge, odor and irritation. She is currently on her menses but denies abnormal vaginal bleeding. She denies fever, chills, nausea or low back pain. She has tried Boric Acid OTC with minimal relief of symptoms. She is sexually active. She is not using any contraception.  She also reports easy bruising, rash of bilateral feet and thick toenails.  She is not sure what is causing this.  She has been using lotion OTC with minimal relief of symptoms.  Review of Systems     Past Medical History:  Diagnosis Date   Allergy    seasonal, dusts   Herpes genitalis    MRSA infection    abscesses - history of    Current Outpatient Medications  Medication Sig Dispense Refill   acyclovir (ZOVIRAX) 400 MG tablet Take 400 mg by mouth 2 (two) times daily.     albuterol (VENTOLIN HFA) 108 (90 Base) MCG/ACT inhaler Inhale 2 puffs into the lungs every 6 (six) hours as needed for wheezing or shortness of breath. 8 g 0   AMBULATORY NON FORMULARY MEDICATION 1 Device by Other route once a week. Blood Pressure Cuff Medium Monitored Regularly at home ICD 10:Z34.90 (Patient not taking: No sig reported) 1 kit 0   amoxicillin-clavulanate (AUGMENTIN) 875-125 MG tablet Take 1 tablet by mouth 2 (two) times daily. 20 tablet 0   clotrimazole-betamethasone (LOTRISONE) cream Apply 1-2 times a day for worsening flare dry skin dermatitis of toes/feet, may re-use daily up to 1 week as needed. 30 g 2   fluticasone (FLONASE) 50 MCG/ACT nasal spray Place 2 sprays into both nostrils daily. Use for 4-6 weeks then stop and use seasonally or as needed. (Patient not taking: No sig reported) 16 g 3   ibuprofen (ADVIL) 800 MG tablet Take 1 tablet (800 mg total)  by mouth every 8 (eight) hours as needed for moderate pain or cramping. 90 tablet 2   ipratropium (ATROVENT) 0.06 % nasal spray Place 2 sprays into both nostrils 4 (four) times daily. For up to 5-7 days then stop. 15 mL 0   ondansetron (ZOFRAN ODT) 4 MG disintegrating tablet Take 1 tablet (4 mg total) by mouth every 8 (eight) hours as needed for nausea or vomiting. 20 tablet 0   predniSONE (DELTASONE) 10 MG tablet Take 6 tabs with breakfast Day 1, 5 tabs Day 2, 4 tabs Day 3, 3 tabs Day 4, 2 tabs Day 5, 1 tab Day 6. NOTE Do not take Ibuprofen while on Prednisone 21 tablet 0   sucralfate (CARAFATE) 1 GM/10ML suspension Take 10 mLs (1 g total) by mouth 4 (four) times daily for 10 days. 400 mL 0   valACYclovir (VALTREX) 1000 MG tablet Take 1 tablet (1,000 mg total) by mouth daily. 30 tablet 12   No current facility-administered medications for this visit.    No Known Allergies  Family History  Problem Relation Age of Onset   Bipolar disorder Mother    Heart disease Mother    Hypertension Sister    Diabetes Maternal Grandmother    Hypertension Maternal Grandmother     Social History   Socioeconomic History   Marital status: Single    Spouse name: Not on file  Number of children: Not on file   Years of education: Not on file   Highest education level: Not on file  Occupational History   Not on file  Tobacco Use   Smoking status: Former    Types: Cigars   Smokeless tobacco: Never   Tobacco comments:    2-5 cigars per day on average, which pt uses to smoke marijuana  Vaping Use   Vaping Use: Never used  Substance and Sexual Activity   Alcohol use: Not Currently   Drug use: Not Currently    Types: Marijuana    Comment: 2-5 x per day   Sexual activity: Yes    Partners: Male    Birth control/protection: None  Other Topics Concern   Not on file  Social History Narrative   Not on file   Social Determinants of Health   Financial Resource Strain: Not on file  Food Insecurity:  Not on file  Transportation Needs: Not on file  Physical Activity: Not on file  Stress: Not on file  Social Connections: Not on file  Intimate Partner Violence: Not on file     Constitutional: Denies fever, malaise, fatigue, headache or abrupt weight changes.  Respiratory: Denies difficulty breathing, shortness of breath, cough or sputum production.   Cardiovascular: Denies chest pain, chest tightness, palpitations or swelling in the hands or feet.  Gastrointestinal: Denies abdominal pain, bloating, constipation, diarrhea or blood in the stool.  GU: Patient reports urinary urgency, frequency, vaginal discharge, odor and irritation.  Denies pain with urination, burning sensation, blood in urine, odor or discharge. Skin: Patient reports easy bruising, rash of feet and thick toenails.  Denies redness, lesions or ulcercations.   No other specific complaints in a complete review of systems (except as listed in HPI above).  Objective:   Physical Exam   BP 114/70 (BP Location: Right Arm, Patient Position: Sitting, Cuff Size: Large)   Pulse 68   Temp (!) 97.5 F (36.4 C) (Temporal)   Resp 17   Ht '5\' 9"'  (1.753 m)   Wt 270 lb (122.5 kg)   SpO2 100%   BMI 39.87 kg/m  There were no vitals taken for this visit. Wt Readings from Last 3 Encounters:  11/19/20 (!) 308 lb 10.3 oz (140 kg)  10/22/20 (!) 309 lb 15.5 oz (140.6 kg)  10/20/20 (!) 309 lb 15.5 oz (140.6 kg)    General: Appears her stated age, obese, in NAD. Skin: Thick fungal toenail noted of second toe, right foot.  Peeling skin noted bilateral feet, consistent with fungal infection. HEENT: Head: normal shape and size;  Cardiovascular: Normal rate and rhythm. S1,S2 noted.  No murmur, rubs or gallops noted. Pulmonary/Chest: Normal effort and positive vesicular breath sounds. No respiratory distress. No wheezes, rales or ronchi noted.  Abdomen: Soft and nontender. Normal bowel sounds.  Musculoskeletal:  No difficulty with gait.   Neurological: Alert and oriented.  BMET    Component Value Date/Time   NA 140 10/22/2020 0734   NA 140 09/05/2012 1252   K 3.5 10/22/2020 0734   K 3.9 09/05/2012 1252   CL 106 10/22/2020 0734   CL 103 09/05/2012 1252   CO2 23 10/22/2020 0734   CO2 27 09/05/2012 1252   GLUCOSE 119 (H) 10/22/2020 0734   GLUCOSE 97 09/05/2012 1252   BUN 10 10/22/2020 0734   BUN 16 09/05/2012 1252   CREATININE 0.51 10/22/2020 0734   CREATININE 0.64 09/05/2012 1252   CALCIUM 9.0 10/22/2020 0734   CALCIUM  8.9 (L) 09/05/2012 1252   GFRNONAA >60 10/22/2020 0734   GFRNONAA >60 09/05/2012 1252   GFRAA >60 04/06/2019 1830   GFRAA >60 09/05/2012 1252    Lipid Panel  No results found for: CHOL, TRIG, HDL, CHOLHDL, VLDL, LDLCALC  CBC    Component Value Date/Time   WBC 7.5 10/22/2020 0734   RBC 4.94 10/22/2020 0734   HGB 14.5 10/22/2020 0734   HGB 13.0 09/05/2012 1252   HCT 43.3 10/22/2020 0734   HCT 39.5 09/05/2012 1252   PLT 322 10/22/2020 0734   PLT 269 09/05/2012 1252   MCV 87.7 10/22/2020 0734   MCV 90 09/05/2012 1252   MCH 29.4 10/22/2020 0734   MCHC 33.5 10/22/2020 0734   RDW 13.1 10/22/2020 0734   RDW 13.1 09/05/2012 1252   LYMPHSABS 2.9 04/06/2019 1659   LYMPHSABS 2.1 09/05/2012 1252   MONOABS 0.3 04/06/2019 1659   MONOABS 0.4 09/05/2012 1252   EOSABS 0.1 04/06/2019 1659   EOSABS 0.0 09/05/2012 1252   BASOSABS 0.1 04/06/2019 1659   BASOSABS 0.1 09/05/2012 1252    Hgb A1C Lab Results  Component Value Date   HGBA1C 5.3 08/21/2018          Assessment & Plan:   Screen for STD, Urinary Urgency, Urinary Frequency, Vaginal Discharge, Vaginal Odor and Irritation:  We will check urine gonorrhea, chlamydia,, trichomoniasis, BV and yeast Urinalysis: We will check HIV, RPR and hep C today Encourage safe sexual practices  Easy Bruising:  We will check CBC and c-Met today  Tinea Pedis, Fungal Infection of Toe:  CMET today Rx for Ciclopirox 2 times daily x4 weeks  We  will follow-up after labs, return precautions discussed Webb Silversmith, NP This visit occurred during the SARS-CoV-2 public health emergency.  Safety protocols were in place, including screening questions prior to the visit, additional usage of staff PPE, and extensive cleaning of exam room while observing appropriate contact time as indicated for disinfecting solutions.

## 2021-01-08 LAB — HEPATITIS C ANTIBODY
Hepatitis C Ab: NONREACTIVE
SIGNAL TO CUT-OFF: 0.01 (ref <1.00)

## 2021-01-08 LAB — COMPLETE METABOLIC PANEL WITH GFR
AG Ratio: 1.6 (calc) (ref 1.0–2.5)
ALT: 15 U/L (ref 6–29)
AST: 16 U/L (ref 10–30)
Albumin: 4.5 g/dL (ref 3.6–5.1)
Alkaline phosphatase (APISO): 79 U/L (ref 31–125)
BUN: 13 mg/dL (ref 7–25)
CO2: 28 mmol/L (ref 20–32)
Calcium: 9.7 mg/dL (ref 8.6–10.2)
Chloride: 102 mmol/L (ref 98–110)
Creat: 0.64 mg/dL (ref 0.50–0.96)
Globulin: 2.9 g/dL (calc) (ref 1.9–3.7)
Glucose, Bld: 91 mg/dL (ref 65–99)
Potassium: 4.1 mmol/L (ref 3.5–5.3)
Sodium: 138 mmol/L (ref 135–146)
Total Bilirubin: 0.7 mg/dL (ref 0.2–1.2)
Total Protein: 7.4 g/dL (ref 6.1–8.1)
eGFR: 124 mL/min/{1.73_m2} (ref >=60)

## 2021-01-08 LAB — HEMOGLOBIN A1C
Hgb A1c MFr Bld: 5.6 % of total Hgb (ref <5.7)
Mean Plasma Glucose: 114 mg/dL
eAG (mmol/L): 6.3 mmol/L

## 2021-01-08 LAB — CBC
HCT: 42.3 % (ref 35.0–45.0)
Hemoglobin: 14 g/dL (ref 11.7–15.5)
MCH: 29.9 pg (ref 27.0–33.0)
MCHC: 33.1 g/dL (ref 32.0–36.0)
MCV: 90.4 fL (ref 80.0–100.0)
MPV: 10.1 fL (ref 7.5–12.5)
Platelets: 355 10*3/uL (ref 140–400)
RBC: 4.68 10*6/uL (ref 3.80–5.10)
RDW: 13 % (ref 11.0–15.0)
WBC: 8.5 10*3/uL (ref 3.8–10.8)

## 2021-01-08 LAB — RPR: RPR Ser Ql: NONREACTIVE

## 2021-01-08 LAB — HIV ANTIBODY (ROUTINE TESTING W REFLEX): HIV 1&2 Ab, 4th Generation: NONREACTIVE

## 2021-01-11 LAB — URINE CYTOLOGY ANCILLARY ONLY
Bacterial Vaginitis-Urine: NEGATIVE
Candida Urine: NEGATIVE
Chlamydia: NEGATIVE
Comment: NEGATIVE
Comment: NEGATIVE
Comment: NORMAL
Neisseria Gonorrhea: NEGATIVE
Trichomonas: POSITIVE — AB

## 2021-01-11 MED ORDER — DOXYCYCLINE HYCLATE 100 MG PO TABS
100.0000 mg | ORAL_TABLET | Freq: Two times a day (BID) | ORAL | 0 refills | Status: DC
Start: 2021-01-11 — End: 2022-02-01

## 2021-01-11 NOTE — Addendum Note (Signed)
Addended by: Lorre Munroe on: 01/11/2021 06:46 PM   Modules accepted: Orders

## 2021-01-14 ENCOUNTER — Other Ambulatory Visit: Payer: Self-pay | Admitting: Internal Medicine

## 2021-01-14 MED ORDER — CICLOPIROX OLAMINE 0.77 % EX CREA: TOPICAL_CREAM | Freq: Two times a day (BID) | CUTANEOUS | 0 refills | Status: DC

## 2021-05-24 ENCOUNTER — Other Ambulatory Visit: Payer: Self-pay | Admitting: Advanced Practice Midwife

## 2021-05-24 DIAGNOSIS — Z113 Encounter for screening for infections with a predominantly sexual mode of transmission: Secondary | ICD-10-CM

## 2021-06-03 NOTE — Telephone Encounter (Signed)
Pt was e-rx Valtrex with 12 RF x 1 year on 08/19/20 This should not expire until 08/19/21. Pt will need apt with [place] to obtain more

## 2021-06-23 ENCOUNTER — Ambulatory Visit: Payer: Self-pay

## 2021-06-23 DIAGNOSIS — A6004 Herpesviral vulvovaginitis: Secondary | ICD-10-CM

## 2021-06-23 NOTE — Addendum Note (Signed)
Addended by: Kavin Leech E on: 06/23/2021 02:51 PM ? ? Modules accepted: Orders ? ?

## 2021-06-23 NOTE — Telephone Encounter (Signed)
?  Chief Complaint: HSV outbreak ?Symptoms: NA ?Frequency: Now ?Pertinent Negatives: Patient denies  ?Disposition: [] ED /[] Urgent Care (no appt availability in office) / [] Appointment(In office/virtual)/ []  [facility]/ [] Home Care/ [] Refused Recommended Disposition /[] [facility]/ []  Follow-up with PCP ?Additional Notes: Pt is out of her medication for HSV and has not been able to get a refill. Pt would like a call back. Pt states that Rollene Fare was prescriber. ? ? ? ? ?Reason for Disposition ? [1] Prescription refill request for ESSENTIAL medicine (i.e., likelihood of harm to patient if not taken) AND [2] triager unable to refill per department policy ? ?Answer Assessment - Initial Assessment Questions ?1. DRUG NAME: "What medicine do you need to have refilled?" ?    Acyclovir ?2. REFILLS REMAINING: "How many refills are remaining?" (Note: The label on the medicine or pill bottle will show how many refills are remaining. If there are no refills remaining, then a renewal may be needed.) ?    none ?3. EXPIRATION DATE: "What is the expiration date?" (Note: The label states when the prescription will expire, and thus can no longer be refilled.) ?    na ?4. PRESCRIBING HCP: "Who prescribed it?" Reason: If prescribed by specialist, call should be referred to that group. ?    Ms. Bailty ?5. SYMPTOMS: "Do you have any symptoms?" ?    outbreak ?6. PREGNANCY: "Is there any chance that you are pregnant?" "When was your last menstrual period?" ?    Not pregnant. ? ?Protocols used: Medication Refill and Renewal Call-A-AH ? ?

## 2021-06-24 MED ORDER — VALACYCLOVIR HCL 1 G PO TABS: 1000.0000 mg | ORAL_TABLET | Freq: Every day | ORAL | 5 refills | Status: DC

## 2021-06-24 NOTE — Telephone Encounter (Signed)
Valtrex refilled.

## 2021-06-24 NOTE — Addendum Note (Signed)
Addended by: Jearld Fenton on: 06/24/2021 07:42 AM ? ? Modules accepted: Orders ? ?

## 2022-01-23 ENCOUNTER — Other Ambulatory Visit: Payer: Self-pay

## 2022-01-23 ENCOUNTER — Emergency Department
Admission: EM | Admit: 2022-01-23 | Discharge: 2022-01-23 | Disposition: A | Discharge status: Home / Self Care | Payer: Medicaid Other | Attending: Emergency Medicine | Admitting: Emergency Medicine

## 2022-01-23 ENCOUNTER — Encounter: Payer: Self-pay | Admitting: Emergency Medicine

## 2022-01-23 ENCOUNTER — Emergency Department: Payer: Medicaid Other

## 2022-01-23 DIAGNOSIS — S6992XA Unspecified injury of left wrist, hand and finger(s), initial encounter: Secondary | ICD-10-CM | POA: Diagnosis present

## 2022-01-23 DIAGNOSIS — M79645 Pain in left finger(s): Secondary | ICD-10-CM | POA: Diagnosis not present

## 2022-01-23 DIAGNOSIS — S62647A Nondisplaced fracture of proximal phalanx of left little finger, initial encounter for closed fracture: Secondary | ICD-10-CM | POA: Diagnosis not present

## 2022-01-23 DIAGNOSIS — W1839XA Other fall on same level, initial encounter: Secondary | ICD-10-CM | POA: Insufficient documentation

## 2022-01-23 MED ORDER — OXYCODONE-ACETAMINOPHEN 5-325 MG PO TABS: 1.0000 | ORAL_TABLET | Freq: Four times a day (QID) | ORAL | 0 refills | Status: AC | PRN

## 2022-01-23 NOTE — ED Triage Notes (Signed)
Pt significant other answering for patient, requested that pt be allowed to rate pain and answer questions

## 2022-01-23 NOTE — Discharge Instructions (Addendum)
-  May take ibuprofen as needed for pain.  Utilize hydrocodone/acetaminophen sparingly and only as needed.  Be careful as it may make you dizzy/drowsy and increase your risk of falls or injuries.  -Follow-up with the orthopedist listed in these instructions.  You may call them today or tomorrow to schedule an appointment.  Please let them know that you are seen here in the emergency department.  -Please keep the splint on at all times.  You may exchange it for another commercial splint as you see fit, but avoid any excessive movements of that finger for the next 4 to 6 weeks.  -Return to the emergency department anytime if you begin to experience any new or worsening symptoms.

## 2022-01-23 NOTE — ED Triage Notes (Signed)
Pt reports fell about 20 minutes ago and hurt her left hand pinky finger. Pt is tearful and thinks it is broke

## 2022-01-23 NOTE — ED Provider Notes (Addendum)
[facility] Provider Note    None    (approximate)   History   Chief Complaint Finger Injury   HPI Angela Flynn is a 26 y.o. female, no significant medical history, presents emergency department for evaluation of injury to her left hand.  Patient states that she had a mechanical fall approximately 20 minutes prior to arrival.  She tried to catch herself with her left hand, however her pinky finger reportedly went in an awkward direction.  She states that she feels like it is broken.  Denies wrist pain, forearm pain, elbow pain, cold sensation, numb/tingling, fever/chills, chest pain, or shortness of breath.  History Limitations: No limitations.        Physical Exam  Triage Vital Signs: ED Triage Vitals  Enc Vitals Group     BP 01/23/22 1317 (!) 145/88     Pulse Rate 01/23/22 1317 81     Resp 01/23/22 1317 18     Temp 01/23/22 1317 98.5 F (36.9 C)     Temp Source 01/23/22 1317 Oral     SpO2 01/23/22 1317 99 %     Weight 01/23/22 1301 270 lb 1 oz (122.5 kg)     Height 01/23/22 1301 5\' 9"  (1.753 m)     Head Circumference --      Peak Flow --      Pain Score 01/23/22 1301 10     Pain Loc --      Pain Edu? --      Excl. in [place]? --     Most recent vital signs: Vitals:   01/23/22 1317  BP: (!) 145/88  Pulse: 81  Resp: 18  Temp: 98.5 F (36.9 C)  SpO2: 99%    General: Awake, NAD.  Skin: Warm, dry. No rashes or lesions.  Eyes: PERRL. Conjunctivae normal.  CV: Good peripheral perfusion.  Resp: Normal effort.  Abd: Soft, non-tender. No distention.  Neuro: At baseline. No gross neurological deficits.  Musculoskeletal: Normal ROM of all extremities.  Focused Exam: Diffuse swelling across the left pinky finger.  Tenderness appreciated along the proximal base.  She maintains normal range of motion, including full flexion and extension at the PIP and DIP joint.  Normal cap refill distally.  Sensation intact.   Physical Exam    ED  Results / Procedures / Treatments  Labs (all labs ordered are listed, but only abnormal results are displayed) Labs Reviewed - No data to display   EKG N/A.    RADIOLOGY  ED Provider Interpretation: I personally viewed and interpreted this x-ray, nondisplaced noncomminuted fracture of the left finger proximal phalanx.  DG Hand Complete Left  Result Date: 01/23/2022 CLINICAL DATA:  Fall.  Left fifth finger pain. EXAM: LEFT HAND - COMPLETE 3+ VIEW COMPARISON:  None Available. FINDINGS: Oblique, nondisplaced and non comminuted fracture of the proximal phalanx of the fifth finger, extending from the proximal to the distal shaft. No joint involvement. No fracture angulation. No other fractures. Joints are normally spaced and aligned. Proximal fifth finger soft tissue swelling. IMPRESSION: 1. Nondisplaced, non comminuted fracture of the left fifth finger proximal phalanx. Electronically Signed   By: Lajean Manes M.D.   On: 01/23/2022 13:40    PROCEDURES:  Critical Care performed: N/A.  Marland KitchenSplint Application  Date/Time: 01/23/2022 6:36 PM  Performed by: Teodoro Spray, PA Authorized by: Teodoro Spray, PA   Consent:    Consent obtained:  Verbal   Consent given by:  Patient  Risks, benefits, and alternatives were discussed: yes     Risks discussed:  Discoloration, numbness, pain and swelling Universal protocol:    Patient identity confirmed:  Verbally with patient Pre-procedure details:    Distal neurologic exam:  Normal   Distal perfusion: brisk capillary refill   Procedure details:    Location:  Finger   Finger location:  L small finger   Strapping: yes     Splint type:  Finger   Supplies:  Aluminum splint Post-procedure details:    Distal neurologic exam:  Normal   Distal perfusion: brisk capillary refill     Procedure completion:  Tolerated well, no immediate complications     MEDICATIONS ORDERED IN ED: Medications - No data to display   IMPRESSION /  MDM / ASSESSMENT AND PLAN / ED COURSE  I reviewed the triage vital signs and the nursing notes.                              Differential diagnosis includes, but is not limited to, phalangeal fracture, flexor/extensor tendon injury, metacarpal fracture.  Assessment/Plan Patient presents with pinky finger injury following mechanical fall.  X-ray shows nondisplaced, noncomminuted fracture of the left fifth finger proximal phalanx.  She is neurovascularly intact.  No evidence of flexor or extensor tendon injuries.  Provided patient with a finger splint.  She tolerated the splinting well with no immediate complications.  Provided with a referral to orthopedics for reevaluation.  Advised her to keep the splint on for 4 to 6 weeks or until she can be seen by orthopedics.  Provided her with a brief prescription for oxycodone/acetaminophen.  Will discharge.  Provided the patient with anticipatory guidance, return precautions, and educational material. Encouraged the patient to return to the emergency department at any time if they begin to experience any new or worsening symptoms. Patient expressed understanding and agreed with the plan.   Patient's presentation is most consistent with acute complicated illness / injury requiring diagnostic workup.       FINAL CLINICAL IMPRESSION(S) / ED DIAGNOSES   Final diagnoses:  Closed nondisplaced fracture of proximal phalanx of left little finger, initial encounter     Rx / DC Orders   ED Discharge Orders          Ordered    oxyCODONE-acetaminophen (PERCOCET) 5-325 MG tablet  Every 6 hours PRN        01/23/22 1408             Note:  This document was prepared using Dragon voice recognition software and may include unintentional dictation errors.   Varney Daily, PA 01/23/22 1835    Varney Daily, [place] 01/23/22 [facility]in[facility]    Corena Herter, MD 01/23/22 2002

## 2022-01-23 NOTE — ED Provider Triage Note (Signed)
  Emergency Medicine Provider Triage Evaluation Note  Angela Flynn , a 26 y.o.female,  was evaluated in triage.  Pt complains of left hand/pinky finger pain after mechanical fall.  She states that she f tripped and fell, but she tried to catch herself with her left hand.  However, she states that her left pinky finger went sideways.  She states that she feels like it is broke.   Review of Systems  Positive: Left hand/pinky finger pain Negative: Denies fever, chest pain, vomiting  Physical Exam   Vitals:   01/23/22 1317  BP: (!) 145/88  Pulse: 81  Resp: 18  Temp: 98.5 F (36.9 C)  SpO2: 99%   Gen:   Awake, anxious and tearful. Resp:  Normal effort  MSK:   Moves extremities without difficulty  Other:    Medical Decision Making  Given the patient's initial medical screening exam, the following diagnostic evaluation has been ordered. The patient will be placed in the appropriate treatment space, once one is available, to complete the evaluation and treatment. I have discussed the plan of care with the patient and I have advised the patient that an ED physician or mid-level practitioner will reevaluate their condition after the test results have been received, as the results may give them additional insight into the type of treatment they may need.    Diagnostics: Left hand x-ray.  Treatments: none immediately   Teodoro Spray, [place] 01/23/22 1327

## 2022-01-28 ENCOUNTER — Other Ambulatory Visit: Payer: Self-pay | Admitting: Orthopedic Surgery

## 2022-02-01 ENCOUNTER — Encounter: Payer: Self-pay | Admitting: Orthopedic Surgery

## 2022-02-02 ENCOUNTER — Encounter: Payer: Self-pay | Admitting: Orthopedic Surgery

## 2022-02-04 ENCOUNTER — Ambulatory Visit: Payer: Medicaid Other | Admitting: Anesthesiology

## 2022-02-04 ENCOUNTER — Encounter: Payer: Self-pay | Admitting: Orthopedic Surgery

## 2022-02-04 ENCOUNTER — Ambulatory Visit: Payer: Self-pay

## 2022-02-04 ENCOUNTER — Other Ambulatory Visit: Payer: Self-pay

## 2022-02-04 ENCOUNTER — Encounter
Admission: RE | Disposition: A | Discharge status: Home / Self Care | Payer: Self-pay | Source: Ambulatory Visit | Attending: Orthopedic Surgery

## 2022-02-04 ENCOUNTER — Ambulatory Visit
Admission: RE | Admit: 2022-02-04 | Discharge: 2022-02-04 | Disposition: A | Discharge status: Home / Self Care | Payer: Medicaid Other | Source: Ambulatory Visit | Attending: Orthopedic Surgery | Admitting: Orthopedic Surgery

## 2022-02-04 DIAGNOSIS — F17290 Nicotine dependence, other tobacco product, uncomplicated: Secondary | ICD-10-CM | POA: Diagnosis not present

## 2022-02-04 DIAGNOSIS — Z6839 Body mass index (BMI) 39.0-39.9, adult: Secondary | ICD-10-CM | POA: Diagnosis not present

## 2022-02-04 DIAGNOSIS — S62617A Displaced fracture of proximal phalanx of left little finger, initial encounter for closed fracture: Secondary | ICD-10-CM | POA: Insufficient documentation

## 2022-02-04 HISTORY — DX: Deep phlebothrombosis in pregnancy, unspecified trimester: O22.30

## 2022-02-04 HISTORY — DX: Other complications of anesthesia, initial encounter: T88.59XA

## 2022-02-04 HISTORY — DX: Gestational diabetes mellitus in pregnancy, unspecified control: O24.419

## 2022-02-04 HISTORY — PX: OPEN REDUCTION INTERNAL FIXATION (ORIF) PROXIMAL PHALANX: SHX6235

## 2022-02-04 HISTORY — DX: Headache, unspecified: R51.9

## 2022-02-04 SURGERY — OPEN REDUCTION INTERNAL FIXATION (ORIF) PROXIMAL PHALANX
Anesthesia: General | Site: Hand | Laterality: Left

## 2022-02-04 MED ORDER — DEXAMETHASONE SODIUM PHOSPHATE 4 MG/ML IJ SOLN
INTRAMUSCULAR | Status: DC | PRN
  Administered 2022-02-04: 4 mg via INTRAVENOUS

## 2022-02-04 MED ORDER — FENTANYL CITRATE PF 50 MCG/ML IJ SOSY: 25.0000 ug | PREFILLED_SYRINGE | INTRAMUSCULAR | Status: DC | PRN

## 2022-02-04 MED ORDER — SUCCINYLCHOLINE CHLORIDE 200 MG/10ML IV SOSY
PREFILLED_SYRINGE | INTRAVENOUS | Status: DC | PRN
  Administered 2022-02-04: 120 mg via INTRAVENOUS

## 2022-02-04 MED ORDER — DEXMEDETOMIDINE HCL IN NACL 80 MCG/20ML IV SOLN
INTRAVENOUS | Status: DC | PRN
  Administered 2022-02-04: 4 ug via BUCCAL
  Administered 2022-02-04: 8 ug via BUCCAL

## 2022-02-04 MED ORDER — LACTATED RINGERS IV SOLN: INTRAVENOUS | Status: DC

## 2022-02-04 MED ORDER — ONDANSETRON HCL 4 MG/2ML IJ SOLN: 4.0000 mg | Freq: Four times a day (QID) | INTRAMUSCULAR | Status: DC | PRN

## 2022-02-04 MED ORDER — ONDANSETRON HCL 4 MG/2ML IJ SOLN: 4.0000 mg | Freq: Once | INTRAMUSCULAR | Status: DC | PRN

## 2022-02-04 MED ORDER — MIDAZOLAM HCL 5 MG/5ML IJ SOLN
INTRAMUSCULAR | Status: DC | PRN
  Administered 2022-02-04 (×2): 1 mg via INTRAVENOUS
  Administered 2022-02-04: 2 mg via INTRAVENOUS

## 2022-02-04 MED ORDER — METOCLOPRAMIDE HCL 5 MG/ML IJ SOLN: 5.0000 mg | Freq: Three times a day (TID) | INTRAMUSCULAR | Status: DC | PRN

## 2022-02-04 MED ORDER — HYDROCODONE-ACETAMINOPHEN 7.5-325 MG PO TABS: 1.0000 | ORAL_TABLET | ORAL | Status: DC | PRN

## 2022-02-04 MED ORDER — METOPROLOL TARTRATE 5 MG/5ML IV SOLN
INTRAVENOUS | Status: DC | PRN
  Administered 2022-02-04: 2 mg via INTRAVENOUS

## 2022-02-04 MED ORDER — PHENYLEPHRINE HCL (PRESSORS) 10 MG/ML IV SOLN
INTRAVENOUS | Status: DC | PRN
  Administered 2022-02-04: 100 ug via INTRAVENOUS

## 2022-02-04 MED ORDER — ONDANSETRON HCL 4 MG/2ML IJ SOLN
INTRAMUSCULAR | Status: DC | PRN
  Administered 2022-02-04: 4 mg via INTRAVENOUS

## 2022-02-04 MED ORDER — OXYCODONE-ACETAMINOPHEN 5-325 MG PO TABS: 1.0000 | ORAL_TABLET | ORAL | 0 refills | Status: DC | PRN

## 2022-02-04 MED ORDER — METOCLOPRAMIDE HCL 5 MG PO TABS: 5.0000 mg | ORAL_TABLET | Freq: Three times a day (TID) | ORAL | Status: DC | PRN

## 2022-02-04 MED ORDER — OXYCODONE HCL 5 MG/5ML PO SOLN: 5.0000 mg | Freq: Once | ORAL | Status: AC | PRN

## 2022-02-04 MED ORDER — ACETAMINOPHEN 500 MG PO TABS
1000.0000 mg | ORAL_TABLET | Freq: Once | ORAL | Status: AC
  Administered 2022-02-04: 1000 mg via ORAL

## 2022-02-04 MED ORDER — ACETAMINOPHEN 325 MG PO TABS: 325.0000 mg | ORAL_TABLET | Freq: Four times a day (QID) | ORAL | Status: DC | PRN

## 2022-02-04 MED ORDER — 0.9 % SODIUM CHLORIDE (POUR BTL) OPTIME
TOPICAL | Status: DC | PRN
  Administered 2022-02-04: 500 mL

## 2022-02-04 MED ORDER — SODIUM CHLORIDE 0.9 % IV SOLN: INTRAVENOUS | Status: DC

## 2022-02-04 MED ORDER — KETOROLAC TROMETHAMINE 15 MG/ML IJ SOLN
INTRAMUSCULAR | Status: DC | PRN
  Administered 2022-02-04: 30 mg via INTRAVENOUS

## 2022-02-04 MED ORDER — OXYCODONE HCL 5 MG PO TABS
5.0000 mg | ORAL_TABLET | Freq: Once | ORAL | Status: AC | PRN
  Administered 2022-02-04: 5 mg via ORAL

## 2022-02-04 MED ORDER — LIDOCAINE HCL (CARDIAC) PF 100 MG/5ML IV SOSY
PREFILLED_SYRINGE | INTRAVENOUS | Status: DC | PRN
  Administered 2022-02-04: 100 mg via INTRATRACHEAL

## 2022-02-04 MED ORDER — HYDROCODONE-ACETAMINOPHEN 5-325 MG PO TABS: 1.0000 | ORAL_TABLET | ORAL | Status: DC | PRN

## 2022-02-04 MED ORDER — ONDANSETRON HCL 4 MG PO TABS: 4.0000 mg | ORAL_TABLET | Freq: Four times a day (QID) | ORAL | Status: DC | PRN

## 2022-02-04 MED ORDER — PROPOFOL 10 MG/ML IV BOLUS
INTRAVENOUS | Status: DC | PRN
  Administered 2022-02-04 (×3): 100 mg via INTRAVENOUS

## 2022-02-04 MED ORDER — CEFAZOLIN IN SODIUM CHLORIDE 3-0.9 GM/100ML-% IV SOLN
3.0000 g | INTRAVENOUS | Status: AC
  Administered 2022-02-04: 3 g via INTRAVENOUS

## 2022-02-04 MED ORDER — BUPIVACAINE HCL 0.5 % IJ SOLN
INTRAMUSCULAR | Status: DC | PRN
  Administered 2022-02-04: 10 mL

## 2022-02-04 MED ORDER — MORPHINE SULFATE (PF) 2 MG/ML IV SOLN: 0.5000 mg | INTRAVENOUS | Status: DC | PRN

## 2022-02-04 MED ORDER — FENTANYL CITRATE (PF) 100 MCG/2ML IJ SOLN
INTRAMUSCULAR | Status: DC | PRN
  Administered 2022-02-04 (×2): 100 ug via INTRAVENOUS

## 2022-02-04 SURGICAL SUPPLY — 36 items
APL PRP STRL LF DISP 70% ISPRP (MISCELLANEOUS) ×1
BIT DRILL 1.1 (BIT) ×1
BIT DRILL 60X20X1.1XQC TMX (BIT) IMPLANT
BIT DRL 60X20X1.1XQC TMX (BIT) ×1
BNDG ELASTIC 4X5.8 VLCR STR LF (GAUZE/BANDAGES/DRESSINGS) ×1 IMPLANT
CHLORAPREP W/TINT 26 (MISCELLANEOUS) ×1 IMPLANT
COVER LIGHT HANDLE UNIVERSAL (MISCELLANEOUS) IMPLANT
CUFF TOURN SGL QUICK 18X4 (TOURNIQUET CUFF) IMPLANT
DRAPE FLUOR MINI C-ARM 54X84 (DRAPES) ×1 IMPLANT
ELECT REM PT RETURN 9FT ADLT (ELECTROSURGICAL) ×1
ELECTRODE REM PT RTRN 9FT ADLT (ELECTROSURGICAL) ×1 IMPLANT
GAUZE SPONGE 4X4 12PLY STRL (GAUZE/BANDAGES/DRESSINGS) ×1 IMPLANT
GAUZE XEROFORM 1X8 LF (GAUZE/BANDAGES/DRESSINGS) ×2 IMPLANT
GLOVE SURG SYN 9.0  PF PI (GLOVE) ×1
GLOVE SURG SYN 9.0 PF PI (GLOVE) ×1 IMPLANT
GOWN STRL REUS W/ TWL LRG LVL3 (GOWN DISPOSABLE) ×1 IMPLANT
GOWN STRL REUS W/TWL LRG LVL3 (GOWN DISPOSABLE) ×1
KIT TURNOVER KIT A (KITS) ×1 IMPLANT
MANIFOLD NEPTUNE II (INSTRUMENTS) ×1 IMPLANT
NS IRRIG 500ML POUR BTL (IV SOLUTION) ×1 IMPLANT
PACK EXTREMITY ARMC (MISCELLANEOUS) ×1 IMPLANT
PAD CAST 4YDX4 CTTN HI CHSV (CAST SUPPLIES) ×2 IMPLANT
PADDING CAST BLEND 4X4 STRL (MISCELLANEOUS) IMPLANT
PADDING CAST COTTON 4X4 STRL (CAST SUPPLIES) ×2
SCREW 1.5MM CORTICAL FT 10MM (Screw) IMPLANT
SCREW 1.5MM CORTICAL FT 11MM (Screw) IMPLANT
SCREW 1.5MM CORTICAL FT 7MM (Screw) IMPLANT
SCREW 1.5MM CORTICAL FT 8MM (Screw) IMPLANT
SPLINT CAST 1 STEP 3X12 (MISCELLANEOUS) ×1 IMPLANT
SUT ETHILON 4-0 (SUTURE) ×1
SUT ETHILON 4-0 FS2 18XMFL BLK (SUTURE) ×1
SUT VICRYL 3-0 27IN (SUTURE) ×1 IMPLANT
SUTURE ETHLN 4-0 FS2 18XMF BLK (SUTURE) ×1 IMPLANT
SYR 3ML LL SCALE MARK (SYRINGE) ×1 IMPLANT
TAP TI MINI 1.5MM CORTICAL (TAP) IMPLANT
WATER STERILE IRR 500ML POUR (IV SOLUTION) ×1 IMPLANT

## 2022-02-04 NOTE — Discharge Instructions (Signed)
Keep arm elevated is much as possible through the weekend.  Ice to the palm may help with pain. Pain medicine as directed Keep dressing clean and dry and do not remove bandage Call office if you are having problems [phone]

## 2022-02-04 NOTE — Anesthesia Procedure Notes (Signed)
Procedure Name: Intubation Date/Time: 02/04/2022 12:22 PM  Performed by: Patience Musca., CRNAPre-anesthesia Checklist: Patient identified, Patient being monitored, Timeout performed, Emergency Drugs available and Suction available Patient Re-evaluated:Patient Re-evaluated prior to induction Oxygen Delivery Method: Circle system utilized Preoxygenation: Pre-oxygenation with 100% oxygen Induction Type: IV induction Ventilation: Mask ventilation without difficulty Laryngoscope Size: Mac and 3 Grade View: Grade I Tube type: Oral Tube size: 7.0 mm Number of attempts: 1 Airway Equipment and Method: Stylet Placement Confirmation: ETT inserted through vocal cords under direct vision, positive ETCO2 and breath sounds checked- equal and bilateral Secured at: 21 cm Tube secured with: Tape Dental Injury: Teeth and Oropharynx as per pre-operative assessment

## 2022-02-04 NOTE — Transfer of Care (Signed)
Immediate Anesthesia Transfer of Care Note  Patient: Angela Flynn  Procedure(s) Performed: Open reduction and internal fixation of left fifth digit proximal phalanx fracture (Left: Hand)  Patient Location: PACU  Anesthesia Type: General  Level of Consciousness: drowsy  Airway and Oxygen Therapy: Patient Spontanous Breathing and Patient connected to supplemental oxygen  Post-op Assessment: Post-op Vital signs reviewed, Patient's Cardiovascular Status Stable, Respiratory Function Stable, Patent Airway and No signs of Nausea or vomiting  Post-op Vital Signs: Reviewed and stable  Complications: There were no known notable events for this encounter.

## 2022-02-04 NOTE — Op Note (Signed)
02/04/2022  1:34 PM  PATIENT:  Angela Flynn  26 y.o. female  PRE-OPERATIVE DIAGNOSIS:  Closed displaced fracture of proximal phalanx of left little finger, initial encounter S62.617A  POST-OPERATIVE DIAGNOSIS:  Closed displaced fracture of proximal phalanx of left little finger, initial encounter S62.617A  PROCEDURE:  Procedure(s): Open reduction and internal fixation of left fifth digit proximal phalanx fracture (Left)  SURGEON: Laurene Footman, MD  ASSISTANTS: None  ANESTHESIA:   general  EBL:  Total I/O In: 456.7 [I.V.:400; IV Piggyback:56.7] Out: -   BLOOD ADMINISTERED:none  DRAINS: none   LOCAL MEDICATIONS USED:  MARCAINE     SPECIMEN:  No Specimen  DISPOSITION OF SPECIMEN:  N/A  COUNTS:  YES  TOURNIQUET:   Total Tourniquet Time Documented: Upper Arm (Left) - 52 minutes Total: Upper Arm (Left) - 52 minutes   IMPLANTS: K wire x2  DICTATION: .Dragon Dictation patient was brought to the operating room and after adequate anesthesia was obtained the left arm was prepped and draped in usual sterile fashion.  After patient identification and timeout procedures were completed tourniquet was raised.  A mid axial approach was made on the ulnar side of the proximal phalanx of the little finger and the subcutaneous tissue spread exposing the fracture.  Reduction clamp was utilized and attempt was made multiple times to get a interfragmentary screw fixation but this was unsuccessful with the distal fragment not holding the screw as it was a very narrow fragment because of this 2 K wires utilized instead this appeared to give appropriate alignment in AP lateral and rotational examination.  The K wires were cut short and bent over after irrigating the wound and closing the wound with simple interrupted 4-0 nylon 10 cc of half percent Sensorcaine were infiltrated as a digital block to aid in postop analgesia.  Xeroform 4 x 4 web roll and dorsal block splint with the MCP joints  in flexion was placed  PLAN OF CARE: Discharge to home after PACU  PATIENT DISPOSITION:  PACU - hemodynamically stable.

## 2022-02-04 NOTE — Anesthesia Postprocedure Evaluation (Signed)
Anesthesia Post Note  Patient: Angela Flynn  Procedure(s) Performed: Open reduction and internal fixation of left fifth digit proximal phalanx fracture (Left: Hand)  Patient location during evaluation: PACU Anesthesia Type: General Level of consciousness: awake and alert Pain management: pain level controlled Vital Signs Assessment: post-procedure vital signs reviewed and stable Respiratory status: spontaneous breathing, nonlabored ventilation, respiratory function stable and patient connected to nasal cannula oxygen Cardiovascular status: blood pressure returned to baseline and stable Postop Assessment: no apparent nausea or vomiting Anesthetic complications: no   There were no known notable events for this encounter.   Last Vitals:  Vitals:   02/04/22 1338 02/04/22 1400  BP: 127/74 (!) 123/93  Pulse: 81   Resp: 20 15  Temp: (!) 36.4 C 36.4 C  SpO2: 99% 99%    Last Pain:  Vitals:   02/04/22 1400  TempSrc:   PainSc: 8                  [name]
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
## 2022-02-04 NOTE — Anesthesia Preprocedure Evaluation (Signed)
Anesthesia Evaluation  Patient identified by MRN, date of birth, ID band Patient awake    Reviewed: Allergy & Precautions, NPO status , Patient's Chart, lab work & pertinent test results  History of Anesthesia Complications Negative for: history of anesthetic complications  Airway Mallampati: I  TM Distance: >3 FB Neck ROM: Full    Dental no notable dental hx. (+) Teeth Intact   Pulmonary neg sleep apnea, neg COPD, Current Smoker and Patient abstained from smoking.,  Occasional cigar use   Pulmonary exam normal breath sounds clear to auscultation       Cardiovascular Exercise Tolerance: Good METS(-) hypertension(-) CAD and (-) Past MI negative cardio ROS  (-) dysrhythmias  Rhythm:Regular Rate:Normal - Systolic murmurs    Neuro/Psych  Headaches, negative psych ROS   GI/Hepatic neg GERD  ,(+)     substance abuse  marijuana use,   Endo/Other  neg diabetesMorbid obesity  Renal/GU negative Renal ROS     Musculoskeletal   Abdominal (+) + obese,   Peds  Hematology   Anesthesia Other Findings Past Medical History: No date: Allergy     Comment:  seasonal, dusts No date: Complication of anesthesia     Comment:  Felt like she couldn't breathe with mask on after 1               procedure No date: DVT (deep vein thrombosis) in pregnancy No date: Gestational diabetes No date: Headache     Comment:  2-3 per week No date: Herpes genitalis No date: MRSA infection     Comment:  abscesses - history of  Reproductive/Obstetrics                             Anesthesia Physical Anesthesia Plan  ASA: 3  Anesthesia Plan: General   Post-op Pain Management: Tylenol PO (pre-op) and Toradol IV (intra-op)   Induction: Intravenous  PONV Risk Score and Plan: 3 and Ondansetron, Dexamethasone and Midazolam  Airway Management Planned: LMA  Additional Equipment: None  Intra-op Plan:   Post-operative  Plan: Extubation in OR  Informed Consent: I have reviewed the patients History and Physical, chart, labs and discussed the procedure including the risks, benefits and alternatives for the proposed anesthesia with the patient or authorized representative who has indicated his/her understanding and acceptance.     Dental advisory given  Plan Discussed with: CRNA and Surgeon  Anesthesia Plan Comments: (Patient extremely nervous about anesthesia in general, no specific concerns. I addressed her fears and empathized, assured her that she is as optimized as she can be and we will take good care of her. Discussed risks of anesthesia with patient, including PONV, sore throat, lip/dental/eye damage. Rare risks discussed as well, such as cardiorespiratory and neurological sequelae, and allergic reactions. Discussed the role of CRNA in patient's perioperative care. Patient understands. Patient informed about increased incidence of above perioperative risk due to high BMI. Patient understands. )        Anesthesia Quick Evaluation

## 2022-02-07 ENCOUNTER — Encounter: Payer: Self-pay | Admitting: Orthopedic Surgery

## 2022-02-07 LAB — POCT PREGNANCY, URINE: Preg Test, Ur: NEGATIVE

## 2022-02-22 ENCOUNTER — Ambulatory Visit: Payer: Medicaid Other | Admitting: Nurse Practitioner

## 2022-02-22 ENCOUNTER — Encounter: Payer: Self-pay | Admitting: Nurse Practitioner

## 2022-02-22 DIAGNOSIS — Z113 Encounter for screening for infections with a predominantly sexual mode of transmission: Secondary | ICD-10-CM

## 2022-02-22 DIAGNOSIS — A6000 Herpesviral infection of urogenital system, unspecified: Secondary | ICD-10-CM

## 2022-02-22 DIAGNOSIS — N898 Other specified noninflammatory disorders of vagina: Secondary | ICD-10-CM

## 2022-02-22 LAB — WET PREP FOR TRICH, YEAST, CLUE: Trichomonas Exam: NEGATIVE

## 2022-02-22 MED ORDER — CLOTRIMAZOLE 3 2 % VA CREA: 1.0000 | TOPICAL_CREAM | Freq: Every day | VAGINAL | 0 refills | Status: DC

## 2022-02-22 MED ORDER — VALACYCLOVIR HCL 1 G PO TABS: 1000.0000 mg | ORAL_TABLET | Freq: Every day | ORAL | 11 refills | Status: DC

## 2022-02-22 NOTE — Progress Notes (Signed)
Pt here for STI screening.  Wet mount results reviewed with patient.  Many yeast noted in results.  Clotrimazole 1% vaginal cream dispensed to patient.  Condoms provided.-Collins Scotland, RN

## 2022-02-22 NOTE — Progress Notes (Signed)
[facility] Department  STI clinic/screening visit [address] [place] [address] [phone]  Subjective:  Angela Flynn is a 26 y.o. female being seen today for an STI screening visit. The patient reports they do have symptoms.  Patient reports that they do not desire a pregnancy in the next year.   They reported they are not interested in discussing contraception today.    Patient's last menstrual period was 01/30/2022 (approximate).   Patient has the following medical conditions:   Patient Active Problem List   Diagnosis Date Noted   Herpes genitalis 02/21/2018    Chief Complaint  Patient presents with   SEXUALLY TRANSMITTED DISEASE    Screening-labial pain-whitish, thick, smelly discharge    HPI  Patient reports to clinic today for STD screening.  Patient reports genital itching, discharge, dysuria, odor,  and vaginal irritation that has been present for one month.   Does the patient using douching products? No  Last HIV test per patient/review of record was:  Lab Results  Component Value Date   HIV NON-REACTIVE 01/07/2021   Patient reports last pap was:  2-3 years ago   Screening for MPX risk: Does the patient have an unexplained rash? No Is the patient MSM? No Does the patient endorse multiple sex partners or anonymous sex partners? No Did the patient have close or sexual contact with a person diagnosed with MPX? No Has the patient traveled outside the [place] where MPX is endemic? No Is there a high clinical suspicion for MPX-- evidenced by one of the following No  -Unlikely to be chickenpox  -Lymphadenopathy  -Rash that present in same phase of evolution on any given body part See flowsheet for further details and programmatic requirements.   Immunization history:  Immunization History  Administered Date(s) Administered   Tdap 08/07/2014     The following portions of the patient's history were reviewed and updated as  appropriate: allergies, current medications, past medical history, past social history, past surgical history and problem list.  Objective:  There were no vitals filed for this visit.  Physical Exam Constitutional:      Appearance: Normal appearance.  HENT:     Head: Normocephalic. No abrasion, masses or laceration. Hair is normal.     Right Ear: External ear normal.     Left Ear: External ear normal.     Nose: Nose normal.     Mouth/Throat:     Lips: Pink.     Mouth: Mucous membranes are moist. No oral lesions.     Pharynx: No oropharyngeal exudate or posterior oropharyngeal erythema.     Tonsils: No tonsillar exudate or tonsillar abscesses.  Eyes:     General: Lids are normal.        Right eye: No discharge.        Left eye: No discharge.     Conjunctiva/sclera: Conjunctivae normal.     Right eye: No exudate.    Left eye: No exudate. Abdominal:     General: Abdomen is flat.     Palpations: Abdomen is soft.     Tenderness: There is no abdominal tenderness. There is no rebound.  Genitourinary:    Pubic Area: No rash or pubic lice.      Labia:        Right: No rash, tenderness, lesion or injury.        Left: No rash, tenderness, lesion or injury.      Vagina: Normal. No vaginal discharge, erythema  or lesions.     Cervix: No cervical motion tenderness, discharge, lesion or erythema.     Uterus: Not enlarged and not tender.      Rectum: Normal.     Comments: Amount Discharge: small  Odor: No pH: less than 4.5 Adheres to vaginal wall: No Color: Angela Flynn, vaginal irritation Musculoskeletal:     Cervical back: Full passive range of motion without pain, normal range of motion and neck supple.  Lymphadenopathy:     Cervical: No cervical adenopathy.     Right cervical: No superficial, deep or posterior cervical adenopathy.    Left cervical: No superficial, deep or posterior cervical adenopathy.     Upper Body:     Right upper body: No supraclavicular, axillary or epitrochlear  adenopathy.     Left upper body: No supraclavicular, axillary or epitrochlear adenopathy.     Lower Body: No right inguinal adenopathy. No left inguinal adenopathy.  Skin:    General: Skin is warm and dry.     Findings: No lesion or rash.  Neurological:     Mental Status: She is alert and oriented to person, place, and time.  Psychiatric:        Attention and Perception: Attention normal.        Mood and Affect: Mood normal.        Speech: Speech normal.        Behavior: Behavior normal. Behavior is cooperative.      Assessment and Plan:  Angela Flynn is a 26 y.o. female presenting to the [facility] Department for STI screening  1. Screening examination for venereal disease -26 year old female in clinic today for STD screening. -Patient accepted all screenings including oral, vaginal CT/GC, wet prep, and declined bloodwork for HIV/RPR.  Patient meets criteria for HepB screening? Yes. Ordered? No - refused Patient meets criteria for HepC screening? Yes. Ordered? No - refused  Treat wet prep per standing order Discussed time line for [facility] Lab results and that patient will be called with positive results and encouraged patient to call if she had not heard in 2 weeks.  Counseled to return or seek care for continued or worsening symptoms Recommended condom use with all sex  Patient is currently not using  contraception  to prevent pregnancy.    - Chlamydia/Gonorrhea [place] [facility] - Chlamydia/Gonorrhea [place] [facility] - WET PREP FOR TRICH, YEAST, CLUE  2. Genital herpes simplex, unspecified site -History of Herpes, refill submitted for Valtrex.   - valACYclovir (VALTREX) 1000 MG tablet; Take 1 tablet (1,000 mg total) by mouth daily.  Dispense: 30 tablet; Refill: 11  3. Vaginal irritation -Please treat patient today for vaginal irritation.   - clotrimazole (CLOTRIMAZOLE 3) 2 % vaginal cream; Place 1 Applicatorful vaginally at bedtime.  Dispense: 21 g; Refill: 0    Total time spent: 30 minutes   Return if symptoms worsen or fail to improve.    Glenna Fellows, FNP

## 2022-03-23 ENCOUNTER — Ambulatory Visit: Payer: Medicaid Other | Admitting: Internal Medicine

## 2022-03-23 NOTE — Progress Notes (Deleted)
Subjective:    Patient ID: Angela Flynn, female    DOB: 03/01/1996, 26 y.o.   MRN: [id number]  HPI  Patient presents to clinic today with complaint of late menses.  Her LMP was.  Review of Systems     Past Medical History:  Diagnosis Date   Allergy    seasonal, dusts   Complication of anesthesia    Felt like she couldn't breathe with mask on after 1 procedure   DVT (deep vein thrombosis) in pregnancy    Gestational diabetes    Headache    2-3 per week   Herpes genitalis    MRSA infection    abscesses - history of    Current Outpatient Medications  Medication Sig Dispense Refill   clotrimazole (CLOTRIMAZOLE 3) 2 % vaginal cream Place 1 Applicatorful vaginally at bedtime. 21 g 0   oxyCODONE-acetaminophen (PERCOCET/ROXICET) 5-325 MG tablet Take 1 tablet by mouth every 4 (four) hours as needed for severe pain. 20 tablet 0   valACYclovir (VALTREX) 1000 MG tablet Take 1 tablet (1,000 mg total) by mouth daily. (Patient taking differently: Take 1,000 mg by mouth daily. Takes PRN) 30 tablet 5   valACYclovir (VALTREX) 1000 MG tablet Take 1 tablet (1,000 mg total) by mouth daily. 30 tablet 11   No current facility-administered medications for this visit.    Allergies  Allergen Reactions   Tape     Blisters from tape used after c-section    Family History  Problem Relation Age of Onset   Bipolar disorder Mother    Heart disease Mother    Hypertension Sister    Diabetes Maternal Grandmother    Hypertension Maternal Grandmother     Social History   Socioeconomic History   Marital status: Single    Spouse name: Not on file   Number of children: Not on file   Years of education: Not on file   Highest education level: Not on file  Occupational History   Not on file  Tobacco Use   Smoking status: Some Days    Types: Cigarettes   Smokeless tobacco: Never   Tobacco comments:    Black and mild occasionally   Vaping Use   Vaping Use: Some days   Substances:  Nicotine, Flavoring  Substance and Sexual Activity   Alcohol use: Not Currently   Drug use: Not Currently    Types: Marijuana    Comment: last use 3 years ago   Sexual activity: Yes    Partners: Male    Birth control/protection: Condom  Other Topics Concern   Not on file  Social History Narrative   Not on file   Social Determinants of Health   Financial Resource Strain: Not on file  Food Insecurity: Food Insecurity Present (08/31/2018)   Hunger Vital Sign    Worried About Running Out of Food in the Last Year: Sometimes true    Ran Out of Food in the Last Year: Often true  Transportation Needs: No Transportation Needs (08/31/2018)   PRAPARE - Administrator, Civil Service (Medical): No    Lack of Transportation (Non-Medical): No  Physical Activity: Not on file  Stress: Not on file  Social Connections: Not on file  Intimate Partner Violence: Not At Risk (02/22/2022)   Humiliation, Afraid, Rape, and Kick questionnaire    Fear of Current or Ex-Partner: No    Emotionally Abused: No    Physically Abused: No    Sexually Abused: No  Constitutional: Denies fever, malaise, fatigue, headache or abrupt weight changes.  HEENT: Denies eye pain, eye redness, ear pain, ringing in the ears, wax buildup, runny nose, nasal congestion, bloody nose, or sore throat. Respiratory: Denies difficulty breathing, shortness of breath, cough or sputum production.   Cardiovascular: Denies chest pain, chest tightness, palpitations or swelling in the hands or feet.  Gastrointestinal: Denies abdominal pain, bloating, constipation, diarrhea or blood in the stool.  GU: Patient reports light menses.  Denies urgency, frequency, pain with urination, burning sensation, blood in urine, odor or discharge. Musculoskeletal: Denies decrease in range of motion, difficulty with gait, muscle pain or joint pain and swelling.  Skin: Denies redness, rashes, lesions or ulcercations.  Neurological: Denies  dizziness, difficulty with memory, difficulty with speech or problems with balance and coordination.  Psych: Denies anxiety, depression, SI/HI.  No other specific complaints in a complete review of systems (except as listed in HPI above).  Objective:   Physical Exam  There were no vitals taken for this visit. Wt Readings from Last 3 Encounters:  02/04/22 267 lb (121.1 kg)  01/23/22 270 lb 1 oz (122.5 kg)  01/07/21 270 lb (122.5 kg)    General: Appears their stated age, well developed, well nourished in NAD. Skin: Warm, dry and intact. No rashes, lesions or ulcerations noted. HEENT: Head: normal shape and size; Eyes: sclera white, no icterus, conjunctiva pink, PERRLA and EOMs intact; Ears: Tm's gray and intact, normal light reflex; Nose: mucosa pink and moist, septum midline; Throat/Mouth: Teeth present, mucosa pink and moist, no exudate, lesions or ulcerations noted.  Neck:  Neck supple, trachea midline. No masses, lumps or thyromegaly present.  Cardiovascular: Normal rate and rhythm. S1,S2 noted.  No murmur, rubs or gallops noted. No JVD or BLE edema. No carotid bruits noted. Pulmonary/Chest: Normal effort and positive vesicular breath sounds. No respiratory distress. No wheezes, rales or ronchi noted.  Abdomen: Soft and nontender. Normal bowel sounds. No distention or masses noted. Liver, spleen and kidneys non palpable. Musculoskeletal: Normal range of motion. No signs of joint swelling. No difficulty with gait.  Neurological: Alert and oriented. Cranial nerves II-XII grossly intact. Coordination normal.  Psychiatric: Mood and affect normal. Behavior is normal. Judgment and thought content normal.    BMET    Component Value Date/Time   NA 138 01/07/2021 1404   NA 140 09/05/2012 1252   K 4.1 01/07/2021 1404   K 3.9 09/05/2012 1252   CL 102 01/07/2021 1404   CL 103 09/05/2012 1252   CO2 28 01/07/2021 1404   CO2 27 09/05/2012 1252   GLUCOSE 91 01/07/2021 1404   GLUCOSE 97  09/05/2012 1252   BUN 13 01/07/2021 1404   BUN 16 09/05/2012 1252   CREATININE 0.64 01/07/2021 1404   CALCIUM 9.7 01/07/2021 1404   CALCIUM 8.9 (L) 09/05/2012 1252   GFRNONAA >60 10/22/2020 0734   GFRNONAA >60 09/05/2012 1252   GFRAA >60 04/06/2019 1830   GFRAA >60 09/05/2012 1252    Lipid Panel  No results found for: "CHOL", "TRIG", "HDL", "CHOLHDL", "VLDL", "LDLCALC"  CBC    Component Value Date/Time   WBC 8.5 01/07/2021 1404   RBC 4.68 01/07/2021 1404   HGB 14.0 01/07/2021 1404   HGB 13.0 09/05/2012 1252   HCT 42.3 01/07/2021 1404   HCT 39.5 09/05/2012 1252   PLT 355 01/07/2021 1404   PLT 269 09/05/2012 1252   MCV 90.4 01/07/2021 1404   MCV 90 09/05/2012 1252   MCH 29.9 01/07/2021 1404  MCHC 33.1 01/07/2021 1404   RDW 13.0 01/07/2021 1404   RDW 13.1 09/05/2012 1252   LYMPHSABS 2.9 04/06/2019 1659   LYMPHSABS 2.1 09/05/2012 1252   MONOABS 0.3 04/06/2019 1659   MONOABS 0.4 09/05/2012 1252   EOSABS 0.1 04/06/2019 1659   EOSABS 0.0 09/05/2012 1252   BASOSABS 0.1 04/06/2019 1659   BASOSABS 0.1 09/05/2012 1252    Hgb A1C Lab Results  Component Value Date   HGBA1C 5.6 01/07/2021            Assessment & Plan:     Schedule an appointment for your annual exam Nicki Reaper, NP

## 2022-03-24 ENCOUNTER — Emergency Department
Admission: EM | Admit: 2022-03-24 | Discharge: 2022-03-24 | Disposition: A | Discharge status: Home / Self Care | Payer: Medicaid Other | Attending: Student in an Organized Health Care Education/Training Program | Admitting: Student in an Organized Health Care Education/Training Program

## 2022-03-24 ENCOUNTER — Emergency Department: Payer: Medicaid Other

## 2022-03-24 ENCOUNTER — Other Ambulatory Visit: Payer: Self-pay

## 2022-03-24 DIAGNOSIS — Z1152 Encounter for screening for COVID-19: Secondary | ICD-10-CM | POA: Diagnosis not present

## 2022-03-24 DIAGNOSIS — O2341 Unspecified infection of urinary tract in pregnancy, first trimester: Secondary | ICD-10-CM | POA: Diagnosis not present

## 2022-03-24 DIAGNOSIS — O219 Vomiting of pregnancy, unspecified: Secondary | ICD-10-CM | POA: Diagnosis present

## 2022-03-24 DIAGNOSIS — Z3A01 Less than 8 weeks gestation of pregnancy: Secondary | ICD-10-CM | POA: Insufficient documentation

## 2022-03-24 DIAGNOSIS — O24419 Gestational diabetes mellitus in pregnancy, unspecified control: Secondary | ICD-10-CM | POA: Insufficient documentation

## 2022-03-24 LAB — CBC
HCT: 40.3 % (ref 36.0–46.0)
Hemoglobin: 13.3 g/dL (ref 12.0–15.0)
MCH: 29.8 pg (ref 26.0–34.0)
MCHC: 33 g/dL (ref 30.0–36.0)
MCV: 90.2 fL (ref 80.0–100.0)
Platelets: 317 10*3/uL (ref 150–400)
RBC: 4.47 MIL/uL (ref 3.87–5.11)
RDW: 12 % (ref 11.5–15.5)
WBC: 9 10*3/uL (ref 4.0–10.5)
nRBC: 0 % (ref 0.0–0.2)

## 2022-03-24 LAB — URINALYSIS, ROUTINE W REFLEX MICROSCOPIC
Bilirubin Urine: NEGATIVE
Glucose, UA: NEGATIVE mg/dL
Hgb urine dipstick: NEGATIVE
Ketones, ur: 20 mg/dL — AB
Nitrite: NEGATIVE
Protein, ur: 30 mg/dL — AB
Specific Gravity, Urine: 1.033 — ABNORMAL HIGH (ref 1.005–1.030)
pH: 5 (ref 5.0–8.0)

## 2022-03-24 LAB — RESP PANEL BY RT-PCR (RSV, FLU A&B, COVID)  RVPGX2
Influenza A by PCR: NEGATIVE
Influenza B by PCR: NEGATIVE
Resp Syncytial Virus by PCR: NEGATIVE
SARS Coronavirus 2 by RT PCR: NEGATIVE

## 2022-03-24 LAB — COMPREHENSIVE METABOLIC PANEL
ALT: 19 U/L (ref 0–44)
AST: 24 U/L (ref 15–41)
Albumin: 3.9 g/dL (ref 3.5–5.0)
Alkaline Phosphatase: 60 U/L (ref 38–126)
Anion gap: 10 (ref 5–15)
BUN: 9 mg/dL (ref 6–20)
CO2: 22 mmol/L (ref 22–32)
Calcium: 9.2 mg/dL (ref 8.9–10.3)
Chloride: 106 mmol/L (ref 98–111)
Creatinine, Ser: 0.54 mg/dL (ref 0.44–1.00)
GFR, Estimated: >60 mL/min (ref >60)
Glucose, Bld: 131 mg/dL — ABNORMAL HIGH (ref 70–99)
Potassium: 3.5 mmol/L (ref 3.5–5.1)
Sodium: 138 mmol/L (ref 135–145)
Total Bilirubin: 0.7 mg/dL (ref 0.3–1.2)
Total Protein: 7.4 g/dL (ref 6.5–8.1)

## 2022-03-24 LAB — LIPASE, BLOOD: Lipase: 27 U/L (ref 11–51)

## 2022-03-24 LAB — HCG, QUANTITATIVE, PREGNANCY: hCG, Beta Chain, Quant, S: 97719 m[IU]/mL — ABNORMAL HIGH

## 2022-03-24 LAB — POC URINE PREG, ED: Preg Test, Ur: POSITIVE — AB

## 2022-03-24 MED ORDER — CEPHALEXIN 500 MG PO CAPS: 500.0000 mg | ORAL_CAPSULE | Freq: Three times a day (TID) | ORAL | 0 refills | Status: DC

## 2022-03-24 MED ORDER — ONDANSETRON 4 MG PO TBDP: 4.0000 mg | ORAL_TABLET | Freq: Three times a day (TID) | ORAL | 0 refills | Status: DC | PRN

## 2022-03-24 MED ORDER — DIPHENHYDRAMINE HCL 50 MG/ML IJ SOLN
25.0000 mg | Freq: Once | INTRAMUSCULAR | Status: AC
  Administered 2022-03-24: 25 mg via INTRAVENOUS
  Filled 2022-03-24: qty 1

## 2022-03-24 MED ORDER — METOCLOPRAMIDE HCL 5 MG/ML IJ SOLN
10.0000 mg | Freq: Once | INTRAMUSCULAR | Status: AC
  Administered 2022-03-24: 10 mg via INTRAVENOUS
  Filled 2022-03-24: qty 2

## 2022-03-24 MED ORDER — SODIUM CHLORIDE 0.9 % IV BOLUS
1000.0000 mL | Freq: Once | INTRAVENOUS | Status: AC
  Administered 2022-03-24: 1000 mL via INTRAVENOUS

## 2022-03-24 NOTE — ED Triage Notes (Addendum)
Pt sts that she has been vomiting with SOB previously and lower abd pain. Pt sts that she recently found out she was pregnant and has not had a OBGYN apt yet. G2P1

## 2022-03-24 NOTE — ED Provider Notes (Signed)
[facility] Provider Note    Event Date/Time   First MD Initiated Contact with Patient 03/24/22 1638     (approximate)   History   Vomiting   HPI  Angela Flynn is a 26 y.o. female with history of herpes genitalis, gestational diabetes, DVT presents to the emergency department with concerns of nausea vomiting and diarrhea.  Patient states that she recently found out she is pregnant.  Last period was sometime in November and felt like a normal period.  Patient states she had sex in October but has not had sex since then.  She denies fever or chills.  No chest pain.      Physical Exam   Triage Vital Signs: ED Triage Vitals  Enc Vitals Group     BP 03/24/22 1614 (!) 150/86     Pulse Rate 03/24/22 1614 84     Resp 03/24/22 1614 17     Temp 03/24/22 1614 98.6 F (37 C)     Temp Source 03/24/22 1614 Oral     SpO2 03/24/22 1614 100 %     Weight 03/24/22 1615 270 lb (122.5 kg)     Height 03/24/22 1640 5\' 9"  (1.753 m)     Head Circumference --      Peak Flow --      Pain Score --      Pain Loc --      Pain Edu? --      Excl. in GC? --     Most recent vital signs: Vitals:   03/24/22 1614  BP: (!) 150/86  Pulse: 84  Resp: 17  Temp: 98.6 F (37 C)  SpO2: 100%     General: Awake, no distress.   CV:  Good peripheral perfusion. regular rate and  rhythm Resp:  Normal effort. Lungs CTA Abd:  No distention.   Other:      ED Results / Procedures / Treatments   Labs (all labs ordered are listed, but only abnormal results are displayed) Labs Reviewed  COMPREHENSIVE METABOLIC PANEL - Abnormal; Notable for the following components:      Result Value   Glucose, Bld 131 (*)    All other components within normal limits  URINALYSIS, ROUTINE W REFLEX MICROSCOPIC - Abnormal; Notable for the following components:   Color, Urine YELLOW (*)    APPearance CLOUDY (*)    Specific Gravity, Urine 1.033 (*)    Ketones, ur 20 (*)    Protein, ur 30  (*)    Leukocytes,Ua MODERATE (*)    Bacteria, UA FEW (*)    All other components within normal limits  HCG, QUANTITATIVE, PREGNANCY - Abnormal; Notable for the following components:   hCG, Beta Chain, Quant, S 97,719 (*)    All other components within normal limits  POC URINE PREG, ED - Abnormal; Notable for the following components:   Preg Test, Ur POSITIVE (*)    All other components within normal limits  RESP PANEL BY RT-PCR (RSV, FLU A&B, COVID)  RVPGX2  LIPASE, BLOOD  CBC     EKG     RADIOLOGY     PROCEDURES:   Procedures   MEDICATIONS ORDERED IN ED: Medications  sodium chloride 0.9 % bolus 1,000 mL (1,000 mLs Intravenous New Bag/Given 03/24/22 1738)  metoCLOPramide (REGLAN) injection 10 mg (10 mg Intravenous Given 03/24/22 1737)  diphenhydrAMINE (BENADRYL) injection 25 mg (25 mg Intravenous Given 03/24/22 1802)     IMPRESSION / MDM / ASSESSMENT AND  PLAN / ED COURSE  I reviewed the triage vital signs and the nursing notes.                              Differential diagnosis includes, but is not limited to, COVID, influenza, RSV, gastroenteritis, emesis in pregnancy,  Patient's presentation is most consistent with acute complicated illness / injury requiring diagnostic workup.   Will start fluids and give the patient something for nausea while here in the ED.  Will evaluate labs and consider ultrasound OB less than 14 weeks.   Labs are reassuring other than the urinalysis does show some infection with moderate leuks and bacteria.  Patient's blood type by her past medical charts is O+ so she will not need RhoGAM  Ultrasound OB less than 14 weeks independently reviewed and interpreted by me as having a single IUP.  Radiologist comments estimated age is 6 weeks 5 days  I did discuss the findings with the patient, she was given a prescription for Keflex 500 3 times daily for any UTI, Zofran ODT for nausea and vomiting.  [facility]   Return emergency department worsening.  She is in agreement treatment plan.  Discharged stable condition.   FINAL CLINICAL IMPRESSION(S) / ED DIAGNOSES   Final diagnoses:  Nausea and vomiting in pregnancy  Urinary tract infection in mother during first trimester of pregnancy     Rx / DC Orders   ED Discharge Orders          Ordered    cephALEXin (KEFLEX) 500 MG capsule  3 times daily        03/24/22 1922    ondansetron (ZOFRAN-ODT) 4 MG disintegrating tablet  Every 8 hours PRN        03/24/22 1922             Note:  This document was prepared using Dragon voice recognition software and may include unintentional dictation errors.    Faythe Ghee, PA-C 03/24/22 1930    Willy Eddy, MD 03/25/22 [phone]

## 2022-03-24 NOTE — Discharge Instructions (Signed)
Follow-up with Dr. Valentino Saxon.  Please call for an appointment. Take the medications as prescribed.  The Zofran ODT is for nausea and vomiting. Return to the emergency department if you are worsening

## 2022-03-31 ENCOUNTER — Ambulatory Visit: Payer: Medicaid Other

## 2022-03-31 ENCOUNTER — Other Ambulatory Visit: Payer: Self-pay | Admitting: Obstetrics

## 2022-03-31 ENCOUNTER — Telehealth: Payer: Self-pay

## 2022-03-31 MED ORDER — ONDANSETRON 4 MG PO TBDP: 4.0000 mg | ORAL_TABLET | Freq: Three times a day (TID) | ORAL | 0 refills | Status: DC | PRN

## 2022-03-31 NOTE — Telephone Encounter (Signed)
I sent in some Zofran for her. If she is unable to keep down any fluids or food, she should go to the ED for fluids again. Thanks!  Missy

## 2022-03-31 NOTE — Telephone Encounter (Signed)
Patient contacted office stating that she was suppose to receive call today for NOB intake, I advised she was scheduled for a nurse visit today to confirm pregnancy and will need to reschedule appointment. Patient states that pregnancy was confirmed in ED on 03/24/22, patient reports that she has been suffering with nausea and vomiting. Patient reports that she has been vomiting at least 6-8x a day and would like a prescription sent to her pharmacy to help relieve symptoms. Patient denies taking prenatal vitamins so I suggested to her to take ginger 250mg  four times per day or to try ginger tea to help with symptoms of nausea, patient was encouraged to eat small frequent bland meals and try eating saltine crackers prior to getting out of bed. Patient states that she has tried home remedies with no relief. Since patient is not established yet at this office I was not sure if we can prescribe her medication to help with nausea and vomiting? Can you review chart and please advise. KW

## 2022-04-01 ENCOUNTER — Ambulatory Visit (INDEPENDENT_AMBULATORY_CARE_PROVIDER_SITE_OTHER): Payer: Medicaid Other

## 2022-04-01 DIAGNOSIS — Z369 Encounter for antenatal screening, unspecified: Secondary | ICD-10-CM

## 2022-04-01 DIAGNOSIS — Z3480 Encounter for supervision of other normal pregnancy, unspecified trimester: Secondary | ICD-10-CM | POA: Insufficient documentation

## 2022-04-01 NOTE — Telephone Encounter (Signed)
Patient has been advised. KW 

## 2022-04-01 NOTE — Progress Notes (Signed)
New OB Intake  I connected with  Angela Flynn on 04/01/22 at  8:15 AM EST by telephone and verified that I am speaking with the correct person using two identifiers. Nurse is located at [facility] and pt is located at home.  I woke her up; offered for her to be rescheduled; she said it didn't matter so I proceeded; she never sounded like she woke up while talking with me for approx 30 minutes; her focus was on how sick she has been and is and wanting to sleep. Reminded pt that zofran was called in last night.  She said the FOB was irrelevant.  Her chart said she sometimes smoke cigarettes and vaped - she now denies that saying the only thing she has smoked is weed.  Pt answered most questions with a grunt instead of words.  This intake was very challenging. It was like she didn't care. At one point she asked how much longer it would be b/c she wanted to go back to sleep.  I said we could stop and reschedule her for the rest; she said she didn't want to reschedule; she just wanted to know how much longer; adv 5-10 minutes; she asked for my name which I pleasantly gave and we finished the intake.  I explained I am completing New OB Intake today. We discussed her EDD of 11/12/2022 that is based on U/S of 03/24/2022 - [redacted]w[redacted]d. Pt is G2/P1001. I reviewed her allergies, medications, Medical/Surgical/OB history, and appropriate screenings. Based on history, this is a/an pregnancy uncomplicated .   Patient Active Problem List   Diagnosis Date Noted   Supervision of other normal pregnancy, antepartum 04/01/2022   Marijuana abuse 11/28/2018   Herpes genitalis 02/21/2018    Concerns addressed today None  Delivery Plans:  Plans to deliver at [facility].  Anatomy US Explained first scheduled [place] will be around 20 weeks.   Labs Discussed  genetic screening with patient. Patient desires genetic testing to be drawn with new OB labs. Discussed possible labs to be drawn at new OB  appointment.  COVID Vaccine Patient has not had COVID vaccine.   Social Determinants of Health Food Insecurity: denies Transportation: Patient denies transportation needs.  First visit review I reviewed new OB appt with pt. I explained she will have ob bloodwork and pap smear/pelvic exam if indicated. Explained pt will be seen by Tresea Mall, CNM at first visit; encounter routed to appropriate provider.   Loran Senters, [facility] 04/01/2022  8:38 AM

## 2022-04-01 NOTE — Patient Instructions (Signed)
First Trimester of Pregnancy  The first trimester of pregnancy starts on the first day of your last menstrual period until the end of week 12. This is also called months 1 through 3 of pregnancy. Body changes during your first trimester Your body goes through many changes during pregnancy. The changes usually return to normal after your baby is born. Physical changes You may gain or lose weight. Your breasts may grow larger and hurt. The area around your nipples may get darker. Dark spots or blotches may develop on your face. You may have changes in your hair. Health changes You may feel like you might vomit (nauseous), and you may vomit. You may have heartburn. You may have headaches. You may have trouble pooping (constipation). Your gums may bleed. Other changes You may get tired easily. You may pee (urinate) more often. Your menstrual periods will stop. You may not feel hungry. You may want to eat certain kinds of food. You may have changes in your emotions from day to day. You may have more dreams. Follow these instructions at home: Medicines Take over-the-counter and prescription medicines only as told by your doctor. Some medicines are not safe during pregnancy. Take a prenatal vitamin that contains at least 600 micrograms (mcg) of folic acid. Eating and drinking Eat healthy meals that include: Fresh fruits and vegetables. Whole grains. Good sources of protein, such as meat, eggs, or tofu. Low-fat dairy products. Avoid raw meat and unpasteurized juice, milk, and cheese. If you feel like you may vomit, or you vomit: Eat 4 or 5 small meals a day instead of 3 large meals. Try eating a few soda crackers. Drink liquids between meals instead of during meals. You may need to take these actions to prevent or treat trouble pooping: Drink enough fluids to keep your pee (urine) pale yellow. Eat foods that are high in fiber. These include beans, whole grains, and fresh fruits and  vegetables. Limit foods that are high in fat and sugar. These include fried or sweet foods. Activity Exercise only as told by your doctor. Most people can do their usual exercise routine during pregnancy. Stop exercising if you have cramps or pain in your lower belly (abdomen) or low back. Do not exercise if it is too hot or too humid, or if you are in a place of great height (high altitude). Avoid heavy lifting. If you choose to, you may have sex unless your doctor tells you not to. Relieving pain and discomfort Wear a good support bra if your breasts are sore. Rest with your legs raised (elevated) if you have leg cramps or low back pain. If you have bulging veins (varicose veins) in your legs: Wear support hose as told by your doctor. Raise your feet for 15 minutes, 3-4 times a day. Limit salt in your food. Safety Wear your seat belt at all times when you are in a car. Talk with your doctor if someone is hurting you or yelling at you. Talk with your doctor if you are feeling sad or have thoughts of hurting yourself. Lifestyle Do not use hot tubs, steam rooms, or saunas. Do not douche. Do not use tampons or scented sanitary pads. Do not use herbal medicines, illegal drugs, or medicines that are not approved by your doctor. Do not drink alcohol. Do not smoke or use any products that contain nicotine or tobacco. If you need help quitting, ask your doctor. Avoid cat litter boxes and soil that is used by cats. These carry  germs that can cause harm to the baby and can cause a loss of your baby by miscarriage or stillbirth. General instructions Keep all follow-up visits. This is important. Ask for help if you need counseling or if you need help with nutrition. Your doctor can give you advice or tell you where to go for help. Visit your dentist. At home, brush your teeth with a soft toothbrush. Floss gently. Write down your questions. Take them to your prenatal visits. Where to find more  information American Pregnancy Association: americanpregnancy.org American College of Obstetricians and Gynecologists: www.acog.org Office on Women's Health: womenshealth.gov/pregnancy Contact a doctor if: You are dizzy. You have a fever. You have mild cramps or pressure in your lower belly. You have a nagging pain in your belly area. You continue to feel like you may vomit, you vomit, or you have watery poop (diarrhea) for 24 hours or longer. You have a bad-smelling fluid coming from your vagina. You have pain when you pee. You are exposed to a disease that spreads from person to person, such as chickenpox, measles, Zika virus, HIV, or hepatitis. Get help right away if: You have spotting or bleeding from your vagina. You have very bad belly cramping or pain. You have shortness of breath or chest pain. You have any kind of injury, such as from a fall or a car crash. You have new or increased pain, swelling, or redness in an arm or leg. Summary The first trimester of pregnancy starts on the first day of your last menstrual period until the end of week 12 (months 1 through 3). Eat 4 or 5 small meals a day instead of 3 large meals. Do not smoke or use any products that contain nicotine or tobacco. If you need help quitting, ask your doctor. Keep all follow-up visits. This information is not intended to replace advice given to you by your health care provider. Make sure you discuss any questions you have with your health care provider. Document Revised: 09/04/2019 Document Reviewed: 07/11/2019 Elsevier Patient Education  2023 Elsevier Inc. Commonly Asked Questions During Pregnancy  Cats: A parasite can be excreted in cat feces.  To avoid exposure you need to have another person empty the little box.  If you must empty the litter box you will need to wear gloves.  Wash your hands after handling your cat.  This parasite can also be found in raw or undercooked meat so this should also be  avoided.  Colds, Sore Throats, Flu: Please check your medication sheet to see what you can take for symptoms.  If your symptoms are unrelieved by these medications please call the office.  Dental Work: Most any dental work your dentist recommends is permitted.  X-rays should only be taken during the first trimester if absolutely necessary.  Your abdomen should be shielded with a lead apron during all x-rays.  Please notify your provider prior to receiving any x-rays.  Novocaine is fine; gas is not recommended.  If your dentist requires a note from us prior to dental work please call the office and we will provide one for you.  Exercise: Exercise is an important part of staying healthy during your pregnancy.  You may continue most exercises you were accustomed to prior to pregnancy.  Later in your pregnancy you will most likely notice you have difficulty with activities requiring balance like riding a bicycle.  It is important that you listen to your body and avoid activities that put you at a higher   risk of falling.  Adequate rest and staying well hydrated are a must!  If you have questions about the safety of specific activities ask your provider.    Exposure to Children with illness: Try to avoid obvious exposure; report any symptoms to [place] when noted,  If you have chicken pos, red measles or mumps, you should be immune to these diseases.   Please do not take any vaccines while pregnant unless you have checked with your OB provider.  Fetal Movement: After 28 weeks we recommend you do "kick counts" twice daily.  Lie or sit down in a calm quiet environment and count your baby movements "kicks".  You should feel your baby at least 10 times per hour.  If you have not felt 10 kicks within the first hour get up, walk around and have something sweet to eat or drink then repeat for an additional hour.  If count remains less than 10 per hour notify your provider.  Fumigating: Follow your pest control agent's  advice as to how long to stay out of your home.  Ventilate the area well before re-entering.  Hemorrhoids:   Most over-the-counter preparations can be used during pregnancy.  Check your medication to see what is safe to use.  It is important to use a stool softener or fiber in your diet and to drink lots of liquids.  If hemorrhoids seem to be getting worse please call the office.   Hot Tubs:  Hot tubs Jacuzzis and saunas are not recommended while pregnant.  These increase your internal body temperature and should be avoided.  Intercourse:  Sexual intercourse is safe during pregnancy as long as you are comfortable, unless otherwise advised by your provider.  Spotting may occur after intercourse; report any bright red bleeding that is heavier than spotting.  Labor:  If you know that you are in labor, please go to the hospital.  If you are unsure, please call the office and let us help you decide what to do.  Lifting, straining, etc:  If your job requires heavy lifting or straining please check with your provider for any limitations.  Generally, you should not lift items heavier than that you can lift simply with your hands and arms (no back muscles)  Painting:  Paint fumes do not harm your pregnancy, but may make you ill and should be avoided if possible.  Latex or water based paints have less odor than oils.  Use adequate ventilation while painting.  Permanents & Hair Color:  Chemicals in hair dyes are not recommended as they cause increase hair dryness which can increase hair loss during pregnancy.  " Highlighting" and permanents are allowed.  Dye may be absorbed differently and permanents may not hold as well during pregnancy.  Sunbathing:  Use a sunscreen, as skin burns easily during pregnancy.  Drink plenty of fluids; avoid over heating.  Tanning Beds:  Because their possible side effects are still unknown, tanning beds are not recommended.  Ultrasound Scans:  Routine ultrasounds are performed  at approximately 20 weeks.  You will be able to see your baby's general anatomy an if you would like to know the gender this can usually be determined as well.  If it is questionable when you conceived you may also receive an ultrasound early in your pregnancy for dating purposes.  Otherwise ultrasound exams are not routinely performed unless there is a medical necessity.  Although you can request a scan we ask that you pay for it when  conducted because insurance does not cover " patient request" scans.  Work: If your pregnancy proceeds without complications you may work until your due date, unless your physician or employer advises otherwise.  Round Ligament Pain/Pelvic Discomfort:  Sharp, shooting pains not associated with bleeding are fairly common, usually occurring in the second trimester of pregnancy.  They tend to be worse when standing up or when you remain standing for long periods of time.  These are the result of pressure of certain pelvic ligaments called "round ligaments".  Rest, Tylenol and heat seem to be the most effective relief.  As the womb and fetus grow, they rise out of the pelvis and the discomfort improves.  Please notify the office if your pain seems different than that described.  It may represent a more serious condition.  Common Medications Safe in Pregnancy  Acne:      Constipation:  Benzoyl Peroxide     Colace  Clindamycin      Dulcolax Suppository  Topica Erythromycin     Fibercon  Salicylic Acid      Metamucil         Miralax AVOID:        Senakot   Accutane    Cough:  Retin-A       Cough Drops  Tetracycline      Phenergan w/ Codeine if Rx  Minocycline      Robitussin (Plain & DM)  Antibiotics:     Crabs/Lice:  Ceclor       RID  Cephalosporins    AVOID:  E-Mycins      Kwell  Keflex  Macrobid/Macrodantin   Diarrhea:  Penicillin      Kao-Pectate  Zithromax      Imodium AD         PUSH FLUIDS AVOID:       Cipro     Fever:  Tetracycline      Tylenol (Regular  or Extra  Minocycline       Strength)  Levaquin      Extra Strength-Do not          Exceed 8 tabs/24 hrs Caffeine:        <273m/day (equiv. To 1 cup of coffee or  approx. 3 12 oz sodas)         Gas: Cold/Hayfever:       Gas-X  Benadryl      Mylicon  Claritin       Phazyme  **Claritin-D        Chlor-Trimeton    Headaches:  Dimetapp      ASA-Free Excedrin  Drixoral-Non-Drowsy     Cold Compress  Mucinex (Guaifenasin)     Tylenol (Regular or Extra  Sudafed/Sudafed-12 Hour     Strength)  **Sudafed PE Pseudoephedrine   Tylenol Cold & Sinus     Vicks Vapor Rub  Zyrtec  **AVOID if Problems With Blood Pressure         Heartburn: Avoid lying down for at least 1 hour after meals  Aciphex      Maalox     Rash:  Milk of Magnesia     Benadryl    Mylanta       1% Hydrocortisone Cream  Pepcid  Pepcid Complete   Sleep Aids:  Prevacid      Ambien   Prilosec       Benadryl  Rolaids       Chamomile Tea  Tums (Limit 4/day)     Unisom  Tylenol PM         Warm milk-add vanilla or  Hemorrhoids:       Sugar for taste  Anusol/Anusol H.C.  (RX: Analapram 2.5%)  Sugar Substitutes:  Hydrocortisone OTC     Ok in moderation  Preparation H      Tucks        Vaseline lotion applied to tissue with wiping    Herpes:     Throat:  Acyclovir      Oragel  Famvir  Valtrex     Vaccines:         Flu Shot Leg Cramps:       *Gardasil  Benadryl      Hepatitis A         Hepatitis B Nasal Spray:       Pneumovax  Saline Nasal Spray     Polio Booster         Tetanus Nausea:       Tuberculosis test or PPD  Vitamin B6 25 mg TID   AVOID:    Dramamine      *Gardasil  Emetrol       Live Poliovirus  Ginger Root 250 mg QID    MMR (measles, mumps &  High Complex Carbs @ Bedtime    rebella)  Sea Bands-Accupressure    Varicella (Chickenpox)  Unisom 1/2 tab TID     *No known complications           If received before Pain:         Known pregnancy;   Darvocet       Resume series  after  Lortab        Delivery  Percocet    Yeast:   Tramadol      Femstat  Tylenol 3      Gyne-lotrimin  Ultram       Monistat  Vicodin           MISC:         All Sunscreens           Hair Coloring/highlights          Insect Repellant's          (Including DEET)         Mystic Tans

## 2022-04-05 ENCOUNTER — Telehealth: Payer: Self-pay

## 2022-04-05 NOTE — Telephone Encounter (Signed)
TRIAGE VOICEMAIL: Patient has concerns about her NOB Intake visit this morning that she would like to discuss. She inquired about what medicines she should take and was advised of this verbally, but is unable to remember and can not locate this information on her visit note in my chart.

## 2022-04-05 NOTE — Telephone Encounter (Signed)
Spoke with patient. Guided patient to portion of her after visit summary on my chart where she can find the information on prenatal vitamins that she requested. Briefly discussed patient's concerns left on voicemail. Advised complaints are handled by our practice administrator who is out of the office. Advised I will pass the information along for review. Patient inquired about future visits and being asked questions about the father of the baby. She states he is not in her life and will not be, she does not wish to be asked questions about him. I advised the majority of those questions have already been asked during intake. However, there may be additional questions asked at the initial visit with provider. These questions are asked due to genetics and to be sure we are offering/providing appropriate tests based on needs of each individual patient. Patient voices understanding. She is appreciative of the call. She has no additional questions or concerns at this time.

## 2022-04-11 ENCOUNTER — Encounter: Payer: Self-pay | Admitting: Emergency Medicine

## 2022-04-11 DIAGNOSIS — O219 Vomiting of pregnancy, unspecified: Secondary | ICD-10-CM | POA: Diagnosis present

## 2022-04-11 DIAGNOSIS — N939 Abnormal uterine and vaginal bleeding, unspecified: Secondary | ICD-10-CM | POA: Diagnosis not present

## 2022-04-11 DIAGNOSIS — Z3A09 9 weeks gestation of pregnancy: Secondary | ICD-10-CM | POA: Insufficient documentation

## 2022-04-11 DIAGNOSIS — Z5321 Procedure and treatment not carried out due to patient leaving prior to being seen by health care provider: Secondary | ICD-10-CM | POA: Insufficient documentation

## 2022-04-11 LAB — COMPREHENSIVE METABOLIC PANEL
ALT: 26 U/L (ref 0–44)
AST: 20 U/L (ref 15–41)
Albumin: 4.1 g/dL (ref 3.5–5.0)
Alkaline Phosphatase: 50 U/L (ref 38–126)
Anion gap: 11 (ref 5–15)
BUN: 9 mg/dL (ref 6–20)
CO2: 22 mmol/L (ref 22–32)
Calcium: 9.5 mg/dL (ref 8.9–10.3)
Chloride: 102 mmol/L (ref 98–111)
Creatinine, Ser: 0.47 mg/dL (ref 0.44–1.00)
GFR, Estimated: >60 mL/min (ref >60)
Glucose, Bld: 97 mg/dL (ref 70–99)
Potassium: 3.4 mmol/L — ABNORMAL LOW (ref 3.5–5.1)
Sodium: 135 mmol/L (ref 135–145)
Total Bilirubin: 0.6 mg/dL (ref 0.3–1.2)
Total Protein: 7.9 g/dL (ref 6.5–8.1)

## 2022-04-11 LAB — CBC
HCT: 44.2 % (ref 36.0–46.0)
Hemoglobin: 14.5 g/dL (ref 12.0–15.0)
MCH: 30.1 pg (ref 26.0–34.0)
MCHC: 32.8 g/dL (ref 30.0–36.0)
MCV: 91.7 fL (ref 80.0–100.0)
Platelets: 288 10*3/uL (ref 150–400)
RBC: 4.82 MIL/uL (ref 3.87–5.11)
RDW: 11.8 % (ref 11.5–15.5)
WBC: 10.7 10*3/uL — ABNORMAL HIGH (ref 4.0–10.5)
nRBC: 0 % (ref 0.0–0.2)

## 2022-04-11 LAB — HCG, QUANTITATIVE, PREGNANCY: hCG, Beta Chain, Quant, S: 78186 m[IU]/mL — ABNORMAL HIGH (ref <5)

## 2022-04-11 LAB — LIPASE, BLOOD: Lipase: 26 U/L (ref 11–51)

## 2022-04-11 NOTE — ED Triage Notes (Signed)
Pt [redacted]wks pregnant with c/o yellow vaginal drainage and N/V. Pt sts she has not been able to get appointment at [facility] due to holidays.   [facility] 02/09/2022  EDD 11/11/2022

## 2022-04-12 ENCOUNTER — Emergency Department: Payer: Medicaid Other

## 2022-04-12 ENCOUNTER — Emergency Department
Admission: EM | Admit: 2022-04-12 | Discharge: 2022-04-12 | Discharge status: Against Medical Advice | Payer: Medicaid Other | Attending: Emergency Medicine | Admitting: Emergency Medicine

## 2022-04-12 NOTE — ED Notes (Signed)
No answer when called several times from lobby 

## 2022-04-13 ENCOUNTER — Other Ambulatory Visit (HOSPITAL_COMMUNITY)
Admission: RE | Admit: 2022-04-13 | Discharge: 2022-04-13 | Disposition: A | Discharge status: Home / Self Care | Payer: Medicaid Other | Source: Ambulatory Visit | Attending: Obstetrics | Admitting: Obstetrics

## 2022-04-13 ENCOUNTER — Encounter: Payer: Self-pay | Admitting: Obstetrics

## 2022-04-13 ENCOUNTER — Ambulatory Visit (INDEPENDENT_AMBULATORY_CARE_PROVIDER_SITE_OTHER): Payer: Medicaid Other | Admitting: Obstetrics

## 2022-04-13 VITALS — BP 125/82 | HR 88 | Ht 69.0 in | Wt 254.0 lb

## 2022-04-13 DIAGNOSIS — Z3A09 9 weeks gestation of pregnancy: Secondary | ICD-10-CM | POA: Diagnosis not present

## 2022-04-13 DIAGNOSIS — O09291 Supervision of pregnancy with other poor reproductive or obstetric history, first trimester: Secondary | ICD-10-CM

## 2022-04-13 DIAGNOSIS — N898 Other specified noninflammatory disorders of vagina: Secondary | ICD-10-CM | POA: Insufficient documentation

## 2022-04-13 LAB — POCT URINALYSIS DIPSTICK OB
Bilirubin, UA: NEGATIVE
Blood, UA: NEGATIVE
Glucose, UA: NEGATIVE
Ketones, UA: NEGATIVE
Leukocytes, UA: NEGATIVE
Nitrite, UA: NEGATIVE
POC,PROTEIN,UA: NEGATIVE
Spec Grav, UA: 1.02 (ref 1.010–1.025)
Urobilinogen, UA: 0.2 E.U./dL
pH, UA: 7.5 (ref 5.0–8.0)

## 2022-04-13 MED ORDER — CLOTRIMAZOLE 3 2 % VA CREA: 1.0000 | TOPICAL_CREAM | Freq: Every day | VAGINAL | 0 refills | Status: DC

## 2022-04-13 NOTE — Progress Notes (Signed)
GYN ENCOUNTER  Encounter for Vaginal Discharge   Subjective  HPI: Angela Flynn is a 27 y.o. G2P1001 at [redacted]w[redacted]d who presents today for evaluation of vaginal discharge, odor, and irritation. She reports that for the past 2 weeks, she has had an increased amount of yellow/brown discharge with odor. Her vulva has become irritated and painful, and she has been having some pain in her upper abdomen. There are some painful areas on her labia but no lesions that she has noticed. She has a history of HSV but this is not similar to an [place]. She denies any sexual intercourse since late October.  Past Medical History:  Diagnosis Date   Allergy    seasonal, dusts   Complication of anesthesia    Felt like she couldn't breathe with mask on after 1 procedure   DVT (deep vein thrombosis) in pregnancy    Gestational diabetes    Headache    2-3 per week   Herpes genitalis    MRSA infection    abscesses - history of   Past Surgical History:  Procedure Laterality Date   CESAREAN SECTION     CHOLECYSTECTOMY, LAPAROSCOPIC     OPEN REDUCTION INTERNAL FIXATION (ORIF) PROXIMAL PHALANX Left 02/04/2022   Procedure: Open reduction and internal fixation of left fifth digit proximal phalanx fracture;  Surgeon: Hessie Knows, MD;  Location: [facility];  Service: Orthopedics;  Laterality: Left;   TONSILLECTOMY     OB History     Gravida  2   Para  1   Term  1   Preterm      AB  0   Living  1      SAB  0   IAB  0   Ectopic  0   Multiple      Live Births  1          Allergies  Allergen Reactions   Tape     Blisters from tape used after c-section   Silicone Rash    Blisters from tape used after c-section    Negative except as noted in HPI History obtained from the patient  Objective  BP 125/82   Pulse 88   Ht 5\' 9"  (1.753 m)   Wt 254 lb (115.2 kg)   LMP 02/10/2022 (Approximate)   BMI 37.51 kg/m   Abdomen: soft, non-tender. Bowel sounds normal. No masses,   no organomegaly. Mild tenderness to palpation in epigastric area. Pelvic: External genitalia normal, moderate amount of curd-like white discharge noted on inner labia and around clitoral hood; vagina appears irritated with moderate amount of discharge; cervix normal in appearance, thick white discharge also noted. Swabs collected.  Assessment  1) Vaginal discharge, likely yeast infection. Possible BV.  Plan  1) Will treat empirically for yeast with Monistat OTC. Rx sent for clotrimazole if Monistat is not effective. Discussed comfort measures and prevention of future infections. NOB intake and prenatal visits scheduled.  Lloyd Huger, CNM

## 2022-04-14 ENCOUNTER — Other Ambulatory Visit: Payer: Self-pay | Admitting: Licensed Practical Nurse

## 2022-04-14 DIAGNOSIS — R110 Nausea: Secondary | ICD-10-CM

## 2022-04-14 MED ORDER — ONDANSETRON 4 MG PO TBDP: 4.0000 mg | ORAL_TABLET | Freq: Three times a day (TID) | ORAL | 0 refills | Status: DC | PRN

## 2022-04-14 NOTE — Telephone Encounter (Signed)
Pt calling; was seen yesterday; states nausea med was not sent to pharmacy; her other medication was; started to give pt the vitamin B6/unisom regimen but she quickly stated she cannot afford that; that's why she needs rx. Pt would like it to be done today as her pharmacy is closed on Fridays.  [phone]

## 2022-04-14 NOTE — Progress Notes (Signed)
Pt requesting nausea medication through mychart. Unable to afford OTC unisom.B12. Zofran reordered Roberto Scales, [place]  04/14/22  4:51 PM

## 2022-04-15 ENCOUNTER — Encounter: Payer: Self-pay | Admitting: Obstetrics

## 2022-04-15 ENCOUNTER — Ambulatory Visit (INDEPENDENT_AMBULATORY_CARE_PROVIDER_SITE_OTHER): Payer: Medicaid Other

## 2022-04-15 ENCOUNTER — Telehealth: Payer: Self-pay

## 2022-04-15 DIAGNOSIS — Z113 Encounter for screening for infections with a predominantly sexual mode of transmission: Secondary | ICD-10-CM

## 2022-04-15 DIAGNOSIS — N898 Other specified noninflammatory disorders of vagina: Secondary | ICD-10-CM

## 2022-04-15 DIAGNOSIS — R102 Pelvic and perineal pain: Secondary | ICD-10-CM

## 2022-04-15 LAB — CERVICOVAGINAL ANCILLARY ONLY
Bacterial Vaginitis (gardnerella): NEGATIVE
Candida Glabrata: NEGATIVE
Candida Vaginitis: POSITIVE — AB
Chlamydia: NEGATIVE
Comment: NEGATIVE
Comment: NEGATIVE
Comment: NEGATIVE
Comment: NEGATIVE
Comment: NEGATIVE
Comment: NORMAL
Neisseria Gonorrhea: NEGATIVE
Trichomonas: POSITIVE — AB

## 2022-04-15 NOTE — Telephone Encounter (Signed)
Spoke with patient. She states when she was seen on 04/13/22, there were two sets of labels on the metal cart in the exam room she was placed in. One belonged to her and another belonged to a different patient. She is concerned that her swab was mislabeled/mixed up with the other patient. She reports having a negative swab in October at the ED as well as 11/14 at the [facility]. I am able to located the negative result for 11/14 in EPIC. She was seen in ED on 10/15 for an ankle fracture. No results found for this visit date. Offered patient appointment for nurse visit to collect self swab for retesting. Patient accepted appointment scheduled for 4:35 pm today.

## 2022-04-15 NOTE — Telephone Encounter (Signed)
My Chart message sent

## 2022-04-15 NOTE — Telephone Encounter (Signed)
Angela Flynn called triage line very upset, that she was seen in office on 04/13/2022 and her results came back with Trich and patient stated she's been tested at the hospital, and health department and those results we're negative. Stated she hasn't had sex since she's been pregnant and the only two toilets that she sits on is at her house and here in office. She did state that when she was in office two labels we're faced down and she turned them over and took a picture and now she is concerned as if we have got them two switched up.

## 2022-04-15 NOTE — Progress Notes (Signed)
Patient here today for reswab for aptima per Almyra Free. Previous swab may have been mixed up with another patient. Resending.

## 2022-04-15 NOTE — Addendum Note (Signed)
Addended by: Meryl Dare on: 04/15/2022 05:01 PM   Modules accepted: Orders

## 2022-04-18 ENCOUNTER — Telehealth: Payer: Self-pay

## 2022-04-18 DIAGNOSIS — A599 Trichomoniasis, unspecified: Secondary | ICD-10-CM

## 2022-04-18 NOTE — Telephone Encounter (Signed)
Pt called triage wanting to know her results, she states that [place] told her the results would be back before the weekend, she needs results before the end of the day, I advised her no results were back. Please call

## 2022-04-18 NOTE — Telephone Encounter (Signed)
Spoke with patient. Advised per lab, this test can take 3-4 days to process. [facility] stats results for BV low-0. STI testing has not resulted. She will see results in my chart. Advised to take meds as prescribed by Lloyd Huger, CNM if results are the same. If results are different than previous swab, she will contact us for advice on next steps.

## 2022-04-20 LAB — NUSWAB VAGINITIS PLUS (VG+)
Candida albicans, NAA: POSITIVE — AB
Candida glabrata, NAA: NEGATIVE
Chlamydia trachomatis, NAA: NEGATIVE
Neisseria gonorrhoeae, NAA: NEGATIVE
Trich vag by NAA: POSITIVE — AB

## 2022-04-20 MED ORDER — METRONIDAZOLE 500 MG PO TABS: 500.0000 mg | ORAL_TABLET | Freq: Two times a day (BID) | ORAL | 0 refills | Status: DC

## 2022-04-20 NOTE — Telephone Encounter (Signed)
Spoke with patient. Verified she has seen her 04/15/22 swab results confirmed positive for Trichomonas and Yeast.  She reports she has not started treatment for either. Advised to begin treatment. Upon review of patients chart, metronidazole not found on Medication list. Advised will send. Patient gets notifications from pharmacy when rx's are ready for pick up. Also advised she will be retested in 4-6 weeks to verify medication took care of the infection.

## 2022-04-20 NOTE — Telephone Encounter (Signed)
Left voicemail requesting patient return call.

## 2022-04-22 MED ORDER — METRONIDAZOLE 0.75 % VA GEL: 1.0000 | Freq: Every day | VAGINAL | 1 refills | Status: AC

## 2022-04-22 NOTE — Telephone Encounter (Signed)
Spoke with Roberto Scales, CNM to verify metrogel safe to prescribe in first trimester. Safe per Roberto Scales, CNM. Rx sent.

## 2022-04-22 NOTE — Addendum Note (Signed)
Addended by: Meryl Dare on: 04/22/2022 03:19 PM   Modules accepted: Orders

## 2022-04-22 NOTE — Telephone Encounter (Signed)
Patient reports she has not been able to keep down the oral flagyl. Inquiring if there is another form of treatment. Advised will discusss with on call provider and return call. She also reports she went to the [facility] in [facility] yesterday and had an ultrasound that shows she is [redacted]w[redacted]d on 04/21/22 with EDD 11/07/23.

## 2022-04-22 NOTE — Telephone Encounter (Signed)
Patient aware metrogel rx sent to pharmacy.

## 2022-04-25 ENCOUNTER — Other Ambulatory Visit: Payer: Medicaid Other

## 2022-04-25 ENCOUNTER — Encounter: Payer: Self-pay | Admitting: Advanced Practice Midwife

## 2022-04-25 DIAGNOSIS — Z369 Encounter for antenatal screening, unspecified: Secondary | ICD-10-CM

## 2022-04-25 DIAGNOSIS — Z3480 Encounter for supervision of other normal pregnancy, unspecified trimester: Secondary | ICD-10-CM

## 2022-04-26 LAB — CBC/D/PLT+RPR+RH+ABO+RUBIGG...
Antibody Screen: NEGATIVE
Basophils Absolute: 0 10*3/uL (ref 0.0–0.2)
Basos: 0 %
EOS (ABSOLUTE): 0.1 10*3/uL (ref 0.0–0.4)
Eos: 1 %
HCV Ab: NONREACTIVE
HIV Screen 4th Generation wRfx: NONREACTIVE
Hematocrit: 36 % (ref 34.0–46.6)
Hemoglobin: 12.6 g/dL (ref 11.1–15.9)
Hepatitis B Surface Ag: NEGATIVE
Immature Grans (Abs): 0 10*3/uL (ref 0.0–0.1)
Immature Granulocytes: 0 %
Lymphocytes Absolute: 1.8 10*3/uL (ref 0.7–3.1)
Lymphs: 24 %
MCH: 31 pg (ref 26.6–33.0)
MCHC: 35 g/dL (ref 31.5–35.7)
MCV: 89 fL (ref 79–97)
Monocytes Absolute: 0.4 10*3/uL (ref 0.1–0.9)
Monocytes: 5 %
Neutrophils Absolute: 5.3 10*3/uL (ref 1.4–7.0)
Neutrophils: 70 %
Platelets: 300 10*3/uL (ref 150–450)
RBC: 4.06 x10E6/uL (ref 3.77–5.28)
RDW: 12.8 % (ref 11.7–15.4)
RPR Ser Ql: NONREACTIVE
Rh Factor: POSITIVE
Rubella Antibodies, IGG: 1.33 index (ref >0.99)
Varicella zoster IgG: 400 index (ref >165)
WBC: 7.5 10*3/uL (ref 3.4–10.8)

## 2022-04-26 LAB — HCV INTERPRETATION

## 2022-05-02 ENCOUNTER — Other Ambulatory Visit (HOSPITAL_COMMUNITY)
Admission: RE | Admit: 2022-05-02 | Discharge: 2022-05-02 | Disposition: A | Discharge status: Home / Self Care | Payer: Medicaid Other | Source: Ambulatory Visit | Attending: Advanced Practice Midwife | Admitting: Advanced Practice Midwife

## 2022-05-02 ENCOUNTER — Encounter: Payer: Self-pay | Admitting: Advanced Practice Midwife

## 2022-05-02 ENCOUNTER — Ambulatory Visit (INDEPENDENT_AMBULATORY_CARE_PROVIDER_SITE_OTHER): Payer: Medicaid Other | Admitting: Advanced Practice Midwife

## 2022-05-02 VITALS — BP 120/57 | HR 89 | Wt 251.0 lb

## 2022-05-02 DIAGNOSIS — Z3481 Encounter for supervision of other normal pregnancy, first trimester: Secondary | ICD-10-CM | POA: Diagnosis present

## 2022-05-02 DIAGNOSIS — Z124 Encounter for screening for malignant neoplasm of cervix: Secondary | ICD-10-CM

## 2022-05-02 DIAGNOSIS — O99211 Obesity complicating pregnancy, first trimester: Secondary | ICD-10-CM

## 2022-05-02 DIAGNOSIS — O09291 Supervision of pregnancy with other poor reproductive or obstetric history, first trimester: Secondary | ICD-10-CM | POA: Diagnosis not present

## 2022-05-02 DIAGNOSIS — N898 Other specified noninflammatory disorders of vagina: Secondary | ICD-10-CM | POA: Insufficient documentation

## 2022-05-02 DIAGNOSIS — O09299 Supervision of pregnancy with other poor reproductive or obstetric history, unspecified trimester: Secondary | ICD-10-CM | POA: Insufficient documentation

## 2022-05-02 DIAGNOSIS — Z3A12 12 weeks gestation of pregnancy: Secondary | ICD-10-CM

## 2022-05-02 DIAGNOSIS — O219 Vomiting of pregnancy, unspecified: Secondary | ICD-10-CM

## 2022-05-02 DIAGNOSIS — Z113 Encounter for screening for infections with a predominantly sexual mode of transmission: Secondary | ICD-10-CM | POA: Insufficient documentation

## 2022-05-02 DIAGNOSIS — Z8759 Personal history of other complications of pregnancy, childbirth and the puerperium: Secondary | ICD-10-CM | POA: Insufficient documentation

## 2022-05-02 DIAGNOSIS — O99210 Obesity complicating pregnancy, unspecified trimester: Secondary | ICD-10-CM | POA: Insufficient documentation

## 2022-05-02 DIAGNOSIS — Z369 Encounter for antenatal screening, unspecified: Secondary | ICD-10-CM

## 2022-05-02 LAB — OB RESULTS CONSOLE GBS: GBS: POSITIVE

## 2022-05-02 MED ORDER — PROMETHAZINE HCL 12.5 MG PO TABS: 12.5000 mg | ORAL_TABLET | Freq: Four times a day (QID) | ORAL | 1 refills | Status: DC | PRN

## 2022-05-02 NOTE — Progress Notes (Signed)
Angela Flynn  New Obstetric Patient H&P    Chief Complaint: "Desires prenatal care"   History of Present Illness: Patient is a 27 y.o. G2P1001 Not Hispanic or Latino female, presents with amenorrhea and positive home pregnancy test. Patient's last menstrual period was 02/10/2022 (approximate). and based on 6 week u/s, her EDD is Estimated Date of Delivery: 11/12/22 and her EGA is [redacted]w[redacted]d. Cycles are 5-8 days, irregular, and occur approximately every :  14-28  days. She is uncertain where or when her last PAP smear was or what result.    She had a urine pregnancy test which was positive 5 or 6 week(s)  ago. Her last menstrual period was normal and lasted for  6 or 7 day(s). Since her LMP she claims she has experienced breast tenderness, fatigue, nausea, vomiting. She denies vaginal bleeding. Her past medical history is noncontributory. Her prior pregnancy is notable for gestational HTN, pre-eclampsia, gestational DM, c/section for non-reassuring fetal heart tones.  Since her LMP, she admits to the use of tobacco products  no She claims she has lost  19  pounds since the start of her pregnancy.  There are cats in the home in the home  no  She admits close contact with children on a regular basis  yes  She has had chicken pox in the past no She has had Tuberculosis exposures, symptoms, or previously tested positive for TB   no Current or past history of domestic violence. no  Genetic Screening/Teratology Counseling: (Includes patient, baby's father, or anyone in either family with:)   50. Patient's age >/= 80 at [facility]  no 2. Thalassemia ([place], [place], [place], or Asian background): MCV<80  no 3. Neural tube defect (meningomyelocele, spina bifida, anencephaly)  no 4. Congenital heart defect  no  5. Down syndrome  no 6. Tay-Sachs (Jewish, [place])  no 7. Canavan's Disease  no 8. Sickle cell disease or trait (African)  no  9. Hemophilia or other blood disorders  no  10. Muscular  dystrophy  no  11. Cystic fibrosis  no  12. Huntington's Chorea  no  13. Mental retardation/autism  no 14. Other inherited genetic or chromosomal disorder  no 15. Maternal metabolic disorder (DM, PKU, etc)  no 16. Patient or FOB with a child with a birth defect not listed above no  16a. Patient or FOB with a birth defect themselves no 17. Recurrent pregnancy loss, or stillbirth  no  18. Any medications since LMP other than prenatal vitamins (include vitamins, supplements, OTC meds, drugs, alcohol)  no 19. Any other genetic/environmental exposure to discuss  no  Infection History:   1. Lives with someone with TB or TB exposed  no  2. Patient or partner has history of genital herpes  yes 3. Rash or viral illness since LMP  no 4. History of STI (GC, CT, HPV, syphilis, HIV)  recent trichomonas- used metro gel to treat and still having vaginal irritation 5. History of recent travel :  no  Other pertinent information:  no    Review of Systems:10 point review of systems negative unless otherwise noted in HPI  Past Medical History:  Patient Active Problem List   Diagnosis Date Noted   History of gestational diabetes in prior pregnancy, currently pregnant 05/02/2022   Hx of preeclampsia, prior pregnancy, currently pregnant 05/02/2022   Obesity affecting pregnancy 05/02/2022   Supervision of other normal pregnancy, antepartum 04/01/2022     Clinical Staff Provider  Office Location   Ob/Gyn  Dating  EDD by [redacted]w[redacted]d u/s  Language  English Anatomy US    Flu Vaccine  offer Genetic Screen  NIPS:   TDaP vaccine   offer Hgb A1C or  GTT Early : Third trimester :   Covid declined   LAB RESULTS   Rhogam     Blood Type     Feeding Plan Formula/may pump  Antibody    Contraception undecided Rubella    Circumcision undecided RPR     Pediatrician  undecided HBsAg     Support Person Mom, daughter HIV    Prenatal Classes maybe Varicella     GBS  (For PCN allergy, check sensitivities)   BTL  Consent  Hep C     VBAC Consent  Pap No results found for: "DIAGPAP"    Hgb Electro      CF      SMA            History of marijuana use 11/28/2018    Formatting of this note might be different from the original. Formatting of this note might be different from the original. + MJ 08/21/18 at [facility]: 2-5 cigars per day on average, which pt uses to smoke marijuana 11/29/2018: declines u tox today.  States she has not used MJ in 1.5-2 months.  States she will take u tox next visit. Discussed the following increased risks in mother and fetus during pregnancy with marijuana use: Curator of Obstetricians and Gynecologists (ACOG) and the Academy of Breastfeeding Medicine advise avoiding marijuana use during pregnancy and lactation because of concerns for the neurodevelopmental impact on the developing fetus and child. Chemical products from marijuana use are transferred across the placenta and into breast milk and repetitive exposure of THC resulted in higher fetal levels.    Herpes genitalis 02/21/2018    Takes acyclovir once every 3-4 months - no recent breakouts.     Past Surgical History:  Past Surgical History:  Procedure Laterality Date   CESAREAN SECTION     CHOLECYSTECTOMY, LAPAROSCOPIC     OPEN REDUCTION INTERNAL FIXATION (ORIF) PROXIMAL PHALANX Left 02/04/2022   Procedure: Open reduction and internal fixation of left fifth digit proximal phalanx fracture;  Surgeon: Kennedy Bucker, MD;  Location: [facility];  Service: Orthopedics;  Laterality: Left;   TONSILLECTOMY      Gynecologic History: Patient's last menstrual period was 02/10/2022 (approximate).  Obstetric History: G2P1001  Family History:  Family History  Problem Relation Age of Onset   Bipolar disorder Mother    Heart disease Mother    Heart disease Sister    Hypertension Sister    Diabetes Maternal Grandmother    Hypertension Maternal Grandmother     Social History:  Social History   Socioeconomic  History   Marital status: Single    Spouse name: Not on file   Number of children: 1   Years of education: 12   Highest education level: Not on file  Occupational History   Occupation: unemployed  Tobacco Use   Smoking status: Never   Smokeless tobacco: Never   Tobacco comments:    Black and mild occasionally   Vaping Use   Vaping Use: Never used  Substance and Sexual Activity   Alcohol use: Not Currently   Drug use: Yes    Types: Marijuana    Comment: last use 3 years ago   Sexual activity: Yes    Partners: Male    Birth control/protection: None  Other Topics Concern   Not  on file  Social History Narrative   Not on file   Social Determinants of Health   Financial Resource Strain: Low Risk  (04/01/2022)   Overall Financial Resource Strain (CARDIA)    Difficulty of Paying Living Expenses: Not hard at all  Food Insecurity: No Food Insecurity (04/01/2022)   Hunger Vital Sign    Worried About Running Out of Food in the Last Year: Never true    Ran Out of Food in the Last Year: Never true  Transportation Needs: No Transportation Needs (04/01/2022)   PRAPARE - Hydrologist (Medical): No    Lack of Transportation (Non-Medical): No  Physical Activity: Inactive (04/01/2022)   Exercise Vital Sign    Days of Exercise per Week: 0 days    Minutes of Exercise per Session: 0 min  Stress: Stress Concern Present (04/01/2022)   [place]    Feeling of Stress : Rather much  Social Connections: Unknown (04/01/2022)   Social Connection and Isolation Panel [NHANES]    Frequency of Communication with Friends and Family: Patient refused    Frequency of Social Gatherings with Friends and Family: Never    Attends Religious Services: Never    Marine scientist or Organizations: No    Attends Archivist Meetings: Never    Marital Status: Not on file  Intimate Partner Violence: Not  At Risk (04/01/2022)   Humiliation, Afraid, Rape, and Kick questionnaire    Fear of Current or Ex-Partner: No    Emotionally Abused: No    Physically Abused: No    Sexually Abused: No    Allergies:  Allergies  Allergen Reactions   Tape     Blisters from tape used after c-section   Silicone Rash    Blisters from tape used after c-section    Medications: Prior to Admission medications   Medication Sig Start Date End Date Taking? Authorizing Provider  ondansetron (ZOFRAN-ODT) 4 MG disintegrating tablet Take 1 tablet (4 mg total) by mouth every 8 (eight) hours as needed. 04/14/22  Yes Dominic, Nunzio Cobbs, CNM    Physical Exam Vitals: Blood pressure (!) 120/57, pulse 89, weight 251 lb (113.9 kg), last menstrual period 02/10/2022.  General: NAD HEENT: normocephalic, anicteric Thyroid: no enlargement, no palpable nodules Pulmonary: No increased work of breathing, CTAB Cardiovascular: RRR, distal pulses 2+ Abdomen: NABS, soft, non-tender, non-distended.  Umbilicus without lesions.  No hepatomegaly, splenomegaly or masses palpable. No evidence of hernia  Genitourinary:  External: Normal external female genitalia.  Normal urethral meatus, normal  Bartholin's and Skene's glands.    Vagina: Normal vaginal mucosa, no evidence of prolapse.    Cervix: Grossly normal in appearance, friable Extremities: no edema, erythema, or tenderness Neurologic: Grossly intact Psychiatric: mood appropriate, affect full   The following were addressed during this visit:  Breastfeeding Education - Early initiation of breastfeeding    Comments: Keeps milk supply adequate, helps contract uterus and slow bleeding, and early milk is the perfect first food and is easy to digest.   - The importance of exclusive breastfeeding    Comments: Provides antibodies, Lower risk of breast and ovarian cancers, and type-2 diabetes,Helps your body recover, Reduced chance of SIDS.   - The importance of early  skin-to-skin contact    Comments:  Keeps baby warm and secure, helps keep baby's blood sugar up and breathing steady, easier to bond and breastfeed, and helps calm baby.  - Individualized Education  Comments: Contraindications to breastfeeding and other special medical conditions Patient is uncertain if she wants to breastfeed. She may pump and feed breast milk by bottle.    Assessment: 27 y.o. G2P1001 at [redacted]w[redacted]d presenting to initiate prenatal care  Plan: 1) Avoid alcoholic beverages. 2) Patient encouraged not to smoke.  3) Discontinue the use of all non-medicinal drugs and chemicals.  4) Take prenatal vitamins daily.  5) Nutrition, food safety (fish, cheese advisories, and high nitrite foods) and exercise discussed. 6) Hospital and practice style discussed with cross coverage system.  7) Genetic Screening, such as with 1st Trimester Screening, cell free fetal DNA, AFP testing, and Ultrasound, as well as with amniocentesis and CVS as appropriate, is discussed with patient. At the conclusion of today's visit patient requested genetic testing 8) Patient is asked about travel to areas at risk for the Zika virus, and counseled to avoid travel and exposure to mosquitoes or sexual partners who may have themselves been exposed to the virus. Testing is discussed, and will be ordered as appropriate.  9) NOB screening, Hgb A1C, PIH baseline labs 10) Take baby ASA daily 11) PAPtima today 12) Aptima: vaginitis 13) Return to clinic in 4 weeks for ROB   Tresea Mall, CNM [facility] 05/02/2022, 4:51 PM

## 2022-05-02 NOTE — Patient Instructions (Signed)
Prenatal Care Prenatal care is health care during pregnancy. It helps you and your unborn baby (fetus) stay as healthy as possible. Prenatal care may be provided by a midwife, a family practice doctor, a IT consultant (nurse practitioner or physician assistant), or a childbirth and pregnancy doctor (obstetrician). How does this affect me? During pregnancy, you will be closely monitored for any new conditions that might develop. To lower your risk of pregnancy complications, you and your health care provider will talk about any underlying conditions you have. How does this affect my baby? Early and consistent prenatal care increases the chance that your baby will be healthy during pregnancy. Prenatal care lowers the risk that your baby will be: Born early (prematurely). Smaller than expected at birth (small for gestational age). What can I expect at the first prenatal care visit? Your first prenatal care visit will likely be the longest. You should schedule your first prenatal care visit as soon as you know that you are pregnant. Your first visit is a good time to talk about any questions or concerns you have about pregnancy. Medical history At your visit, you and your health care provider will talk about your medical history, including: Any past pregnancies. Your family's medical history. Medical history of the baby's father. Any long-term (chronic) health conditions you have and how you manage them. Any surgeries or procedures you have had. Any current over-the-counter or prescription medicines, herbs, or supplements that you are taking. Other factors that could pose a risk to your baby, including: Exposure to harmful chemicals or radiation at work or at home. Any substance use, including tobacco, alcohol, and drug use. Your home setting and your stress levels, including: Exposure to abuse or violence. Household financial strain. Your daily health habits, including diet and  exercise. Tests and screenings Your health care provider will: Measure your weight, height, and blood pressure. Do a physical exam, including a pelvic and breast exam. Perform blood tests and urine tests to check for: Urinary tract infection. Sexually transmitted infections (STIs). Low iron levels in your blood (anemia). Blood type and certain proteins on red blood cells (Rh antibodies). Infections and immunity to viruses, such as hepatitis B and rubella. HIV (human immunodeficiency virus). Discuss your options for genetic screening. Tips about staying healthy Your health care provider will also give you information about how to keep yourself and your baby healthy, including: Nutrition and taking vitamins. Physical activity. How to manage pregnancy symptoms such as nausea and vomiting (morning sickness). Infections and substances that may be harmful to your baby and how to avoid them. Food safety. Dental care. Working. Travel. Warning signs to watch for and when to call your health care provider. How often will I have prenatal care visits? After your first prenatal care visit, you will have regular visits throughout your pregnancy. The visit schedule is often as follows: Up to week 28 of pregnancy: once every 4 weeks. 28-36 weeks: once every 2 weeks. After 36 weeks: every week until delivery. Some women may have visits more or less often depending on any underlying health conditions and the health of the baby. Keep all follow-up and prenatal care visits. This is important. What happens during routine prenatal care visits? Your health care provider will: Measure your weight and blood pressure. Check for fetal heart sounds. Measure the height of your uterus in your abdomen (fundal height). This may be measured starting around week 20 of pregnancy. Check the position of your baby inside your uterus. Ask questions  about your diet, sleeping patterns, and whether you can feel the baby  move. Review warning signs to watch for and signs of labor. Ask about any pregnancy symptoms you are having and how you are dealing with them. Symptoms may include: Headaches. Nausea and vomiting. Vaginal discharge. Swelling. Fatigue. Constipation. Changes in your vision. Feeling persistently sad or anxious. Any discomfort, including back or pelvic pain. Bleeding or spotting. Make a list of questions to ask your health care provider at your routine visits. What tests might I have during prenatal care visits? You may have blood, urine, and imaging tests throughout your pregnancy, such as: Urine tests to check for glucose, protein, or signs of infection. Glucose tests to check for a form of diabetes that can develop during pregnancy (gestational diabetes mellitus). This is usually done around week 24 of pregnancy. Ultrasounds to check your baby's growth and development, to check for birth defects, and to check your baby's well-being. These can also help to decide when you should deliver your baby. A test to check for group B strep (GBS) infection. This is usually done around week 36 of pregnancy. Genetic testing. This may include blood, fluid, or tissue sampling, or imaging tests, such as an ultrasound. Some genetic tests are done during the first trimester and some are done during the second trimester. What else can I expect during prenatal care visits? Your health care provider may recommend getting certain vaccines during pregnancy. These may include: A yearly flu shot (annual influenza vaccine). This is especially important if you will be pregnant during flu season. Tdap (tetanus, diphtheria, pertussis) vaccine. Getting this vaccine during pregnancy can protect your baby from whooping cough (pertussis) after birth. This vaccine may be recommended between weeks 27 and 36 of pregnancy. A COVID-19 vaccine. Later in your pregnancy, your health care provider may give you information  about: Childbirth and breastfeeding classes. Choosing a health care provider for your baby. Umbilical cord banking. Breastfeeding. Birth control after your baby is born. The hospital labor and delivery unit and how to set up a tour. Registering at the hospital before you go into labor. Where to find more information Office on Women's Health: LegalWarrants.gl American Pregnancy Association: americanpregnancy.org March of Dimes: marchofdimes.org Summary Prenatal care helps you and your baby stay as healthy as possible during pregnancy. Your first prenatal care visit will most likely be the longest. You will have visits and tests throughout your pregnancy to monitor your health and your baby's health. Bring a list of questions to your visits to ask your health care provider. Make sure to keep all follow-up and prenatal care visits. This information is not intended to replace advice given to you by your health care provider. Make sure you discuss any questions you have with your health care provider. Document Revised: 01/09/2020 Document Reviewed: 01/09/2020 Elsevier Patient Education  [place]. Exercise During Pregnancy Exercise is an important part of being healthy for people of all ages. Exercise improves the function of your heart and lungs and helps you maintain strength, flexibility, and a healthy body weight. Exercise also boosts energy levels and elevates mood. Most women should exercise regularly during pregnancy. Exercise routines may need to change as your pregnancy progresses. In rare cases, women with certain medical conditions or complications may be asked to limit or avoid exercise during pregnancy. Your health care provider will give you information on what will work for you. How does this affect me? Along with maintaining general strength and flexibility, exercising during  pregnancy can help: Keep strength in muscles that are used during labor and  childbirth. Decrease low back pain or symptoms of depression. Control weight gain during pregnancy. Reduce the risk of needing insulin if you develop diabetes during pregnancy. Decrease the risk of cesarean delivery. Speed up your recovery after giving birth. Relieve constipation. How does this affect my baby? Exercise can help you have a healthy pregnancy. Exercise does not cause early (premature) birth. It will not cause your baby to weigh less at birth. What exercises can I do? Many exercises are safe for you to do during pregnancy. Do a variety of exercises that safely increase your heart and breathing rates and help you build and maintain muscle strength. Do exercises exactly as told by your health care provider. You may do these exercises: Walking. Swimming. Water aerobics. Riding a stationary bike. Modified yoga or Pilates. Tell your instructor that you are pregnant. Avoid overstretching, and avoid lying on your back for long periods of time. Running or jogging. Choose this type of exercise only if: You ran or jogged regularly before your pregnancy. You can run or jog and still talk in complete sentences. What exercises should I avoid? You may be told to limit high-intensity exercise depending on your level of fitness and whether you exercised regularly before you were pregnant. You can tell that you are exercising at a high intensity if you are breathing much harder and faster and cannot hold a conversation while exercising. You must avoid: Contact sports. Activities that put you at risk for falling on or being hit in the belly, such as downhill skiing, waterskiing, surfing, rock climbing, cycling, gymnastics, and horseback riding. Scuba diving. Skydiving. Hot yoga or hot Pilates. These activities take place in a room that is heated to high temperatures. Jogging or running, unless you jogged or ran regularly before you were pregnant. While jogging or running, you should always be  able to talk in full sentences. Do not run or jog so fast that you are unable to have a conversation. Do not exercise at more than 6,000 feet above sea level (high elevation) if you are not used to exercising at high elevation. How do I exercise in a safe way?  Avoid overheating. Do not exercise in very high temperatures. Wear loose-fitting, breathable clothes. Avoid dehydration. Drink enough fluid before, during, and after exercise to keep your urine pale yellow. Avoid overstretching. Because of hormone changes during pregnancy, it is easy to overstretch muscles, tendons, and ligaments. Start slowly and ask your health care provider to recommend the types of exercise that are safe for you. Do not exercise to lose weight. Wear a sports bra to support your breasts. Avoid standing still or lying flat on your back as much as you can. Follow these instructions at home: Exercise on most days or all days of the week. Try to exercise for 30 minutes a day, 5 days a week, unless your health care provider tells you not to. If you actively exercised before your pregnancy and you are healthy, your health care provider may tell you to continue to do moderate-intensity to high-intensity exercise. If you are just starting to exercise or did not exercise much before your pregnancy, your health care provider may tell you to do low-intensity to moderate-intensity exercise. Questions to ask your health care provider Is exercise safe for me? What are signs that I should stop exercising? Does my health condition mean that I should not exercise during pregnancy? When should I  avoid exercising during pregnancy? Stop exercising and contact a health care provider if: You have any unusual symptoms such as: Mild contractions of the uterus or cramps in the abdomen. A dizzy feeling that does not go away when you rest. Stop exercising and get help right away if: You have any unusual symptoms such as: Sudden, severe  pain in your low back or your belly. Regular, painful contractions of your uterus. Chest pain. Bleeding or fluid leaking from your vagina. Shortness of breath. Headache. Pain and swelling of your calves. Summary Most women should exercise regularly throughout pregnancy. In rare cases, women with certain medical conditions or complications may be asked to limit or avoid exercise during pregnancy. Do not exercise to lose weight during pregnancy. Your health care provider will tell you what level of physical activity is right for you. Stop exercising and contact a health care provider if you have unusual symptoms, such as mild contractions or dizziness. This information is not intended to replace advice given to you by your health care provider. Make sure you discuss any questions you have with your health care provider. Document Revised: 11/13/2019 Document Reviewed: 11/13/2019 Elsevier Patient Education  [place] for Pregnant Women While you are pregnant, your body requires additional nutrition to help support your growing baby. You also have a higher need for some vitamins and minerals, such as folic acid, calcium, iron, and vitamin D. Eating a healthy, well-balanced diet is very important for your health and your baby's health. Your need for extra calories varies over the course of your pregnancy. Pregnancy is divided into three trimesters, with each trimester lasting 3 months. For most women, it is recommended to consume: 150 extra calories a day during the first trimester. 300 extra calories a day during the second trimester. 300 extra calories a day during the third trimester. What are tips for following this plan? Cooking Practice good food safety and cleanliness. Wash your hands before you eat and after you prepare raw meat. Wash all fruits and vegetables well before peeling or eating. Taking these actions can help to prevent foodborne illnesses that can be very  dangerous to your baby, such as listeriosis. Ask your health care provider for more information about listeriosis. Make sure that all meats, poultry, and eggs are cooked to food-safe temperatures or "well-done." Meal planning  Eat a variety of foods (especially fruits and vegetables) to get a full range of vitamins and minerals. Two or more servings of fish are recommended each week in order to get the most benefits from omega-3 fatty acids that are found in seafood. Choose fish that are lower in mercury, such as salmon and pollock. Limit your overall intake of foods that have "empty calories." These are foods that have little nutritional value, such as sweets, desserts, candies, and sugar-sweetened beverages. Drinks that contain caffeine are okay to drink, but it is better to avoid caffeine. Keep your total caffeine intake to less than 200 mg each day (which is 12 oz or 355 mL of coffee, tea, or soda) or the limit as told by your health care provider. General information Do not try to lose weight or go on a diet during pregnancy. Take a prenatal vitamin to help meet your additional vitamin and mineral needs during pregnancy, specifically for folic acid, iron, calcium, and vitamin D. Remember to stay active. Ask your health care provider what types of exercise and activities are safe for you. What does 150 extra calories look like?  Healthy options that provide 150 extra calories each day could be any of the following: 6-8 oz (170-227 g) plain low-fat yogurt with  cup (70 g) berries. 1 apple with 2 tsp (11 g) peanut butter. Cut-up vegetables with  cup (60 g) hummus. 8 fl oz (237 mL) low-fat chocolate milk. 1 stick of string cheese with 1 medium orange. 1 peanut butter and jelly sandwich that is made with one slice of whole-wheat bread and 1 tsp (5 g) of peanut butter. For 300 extra calories, you could eat two of these healthy options each day. What is a healthy amount of weight to gain? The  right amount of weight gain for you is based on your BMI (body mass index) before you became pregnant. If your BMI was less than 18 (underweight), you should gain 28-40 lb (13-18 kg). If your BMI was 18-24.9 (normal), you should gain 25-35 lb (11-16 kg). If your BMI was 25-29.9 (overweight), you should gain 15-25 lb (7-11 kg). If your BMI was 30 or greater (obese), you should gain 11-20 lb (5-9 kg). What if I am having twins or multiples? Generally, if you are carrying twins or multiples: You may need to eat 300-600 extra calories a day. The recommended range for total weight gain is 25-54 lb (11-25 kg), depending on your BMI before pregnancy. Talk with your health care provider to find out about nutritional needs, weight gain, and exercise that is right for you. What foods should I eat?  Fruits All fruits. Eat a variety of colors and types of fruit. Remember to wash your fruits well before peeling or eating. Vegetables All vegetables. Eat a variety of colors and types of vegetables. Remember to wash your vegetables well before peeling or eating. Grains All grains. Choose whole grains, such as whole-wheat bread, oatmeal, or brown rice. Meats and other protein foods Lean meats, including chicken, [place], and lean cuts of beef, veal, or pork. Fish that is higher in omega-3 fatty acids and lower in mercury, such as salmon, herring, mussels, trout, sardines, pollock, shrimp, crab, and lobster. Tofu. Tempeh. Beans. Eggs. Peanut butter and other nut butters. Dairy Pasteurized milk and milk alternatives, such as almond milk. Pasteurized yogurt and pasteurized cheese. Cottage cheese. Sour cream. Beverages Water. Juices that contain 100% fruit juice or vegetable juice. Caffeine-free teas and decaffeinated coffee. Fats and oils Fats and oils are okay to include in moderation. Sweets and desserts Sweets and desserts are okay to include in moderation. Seasoning and other foods All pasteurized  condiments. The items listed above may not be a complete list of foods and beverages you can eat. Contact a dietitian for more information. What foods should I avoid? Fruits Raw (unpasteurized) fruit juices. Vegetables Unpasteurized vegetable juices. Meats and other protein foods Precooked or cured meat, such as bologna, hot dogs, sausages, or meat loaves. (If you must eat those meats, reheat them until they are steaming hot.) Refrigerated pate, meat spreads from a meat counter, or smoked seafood that is found in the refrigerated section of a store. Raw or undercooked meats, poultry, and eggs. Raw fish, such as sushi or sashimi. Fish that have high mercury content, such as tilefish, shark, swordfish, and king mackerel. Dairy Unpasteurized milk and any foods that have unpasteurized milk in them. Soft cheeses, such as feta, queso blanco, queso fresco, [place], [place], panela, and blue-veined cheeses (unless they are made with pasteurized milk, which must be stated on the label). Beverages Alcohol. Sugar-sweetened beverages, such as sodas, teas, or  energy drinks. Seasoning and other foods Homemade fermented foods and drinks, such as pickles, sauerkraut, or kombucha drinks. (Store-bought pasteurized versions of these are okay.) Salads that are made in a store or deli, such as ham salad, chicken salad, egg salad, tuna salad, and seafood salad. The items listed above may not be a complete list of foods and beverages you should avoid. Contact a dietitian for more information. Where to find more information To calculate the number of calories you need based on your height, weight, and activity level, you can use an online calculator such as: https://www.hunter.com/ To calculate how much weight you should gain during pregnancy, you can use an online pregnancy weight gain calculator such as: MassVoice.es To learn more about eating fish during pregnancy, talk with your health care provider or  visit: [place].ch Summary While you are pregnant, your body requires additional nutrition to help support your growing baby. Eat a variety of foods, especially fruits and vegetables, to get a full range of vitamins and minerals. Practice good food safety and cleanliness. Wash your hands before you eat and after you prepare raw meat. Wash all fruits and vegetables well before peeling or eating. Taking these actions can help to prevent foodborne illnesses, such as listeriosis, that can be very dangerous to your baby. Do not eat raw meat or fish. Do not eat fish that have high mercury content, such as tilefish, shark, swordfish, and king mackerel. Do not eat raw (unpasteurized) dairy. Take a prenatal vitamin to help meet your additional vitamin and mineral needs during pregnancy, specifically for folic acid, iron, calcium, and vitamin D. This information is not intended to replace advice given to you by your health care provider. Make sure you discuss any questions you have with your health care provider. Document Revised: 10/24/2019 Document Reviewed: 10/24/2019 Elsevier Patient Education  [place].

## 2022-05-03 LAB — CBC WITH DIFFERENTIAL/PLATELET
Basophils Absolute: 0 10*3/uL (ref 0.0–0.2)
Basos: 0 %
EOS (ABSOLUTE): 0.1 10*3/uL (ref 0.0–0.4)
Eos: 1 %
Hematocrit: 35 % (ref 34.0–46.6)
Hemoglobin: 12.5 g/dL (ref 11.1–15.9)
Immature Grans (Abs): 0 10*3/uL (ref 0.0–0.1)
Immature Granulocytes: 0 %
Lymphocytes Absolute: 2 10*3/uL (ref 0.7–3.1)
Lymphs: 28 %
MCH: 30.5 pg (ref 26.6–33.0)
MCHC: 35.7 g/dL (ref 31.5–35.7)
MCV: 85 fL (ref 79–97)
Monocytes Absolute: 0.4 10*3/uL (ref 0.1–0.9)
Monocytes: 5 %
Neutrophils Absolute: 4.7 10*3/uL (ref 1.4–7.0)
Neutrophils: 66 %
Platelets: 305 10*3/uL (ref 150–450)
RBC: 4.1 x10E6/uL (ref 3.77–5.28)
RDW: 12.4 % (ref 11.7–15.4)
WBC: 7.2 10*3/uL (ref 3.4–10.8)

## 2022-05-03 LAB — COMPREHENSIVE METABOLIC PANEL
ALT: 14 IU/L (ref 0–32)
AST: 15 IU/L (ref 0–40)
Albumin/Globulin Ratio: 1.8 (ref 1.2–2.2)
Albumin: 4.2 g/dL (ref 4.0–5.0)
Alkaline Phosphatase: 70 IU/L (ref 44–121)
BUN/Creatinine Ratio: 17 (ref 9–23)
BUN: 8 mg/dL (ref 6–20)
Bilirubin Total: 0.3 mg/dL (ref 0.0–1.2)
CO2: 19 mmol/L — ABNORMAL LOW (ref 20–29)
Calcium: 9.4 mg/dL (ref 8.7–10.2)
Chloride: 100 mmol/L (ref 96–106)
Creatinine, Ser: 0.46 mg/dL — ABNORMAL LOW (ref 0.57–1.00)
Globulin, Total: 2.3 g/dL (ref 1.5–4.5)
Glucose: 91 mg/dL (ref 70–99)
Potassium: 3.8 mmol/L (ref 3.5–5.2)
Sodium: 135 mmol/L (ref 134–144)
Total Protein: 6.5 g/dL (ref 6.0–8.5)
eGFR: 133 mL/min/{1.73_m2} (ref >59)

## 2022-05-03 LAB — MICROSCOPIC EXAMINATION
Casts: NONE SEEN /lpf
Epithelial Cells (non renal): >10 /hpf — AB (ref 0–10)

## 2022-05-03 LAB — URINALYSIS, ROUTINE W REFLEX MICROSCOPIC
Bilirubin, UA: NEGATIVE
Glucose, UA: NEGATIVE
Ketones, UA: NEGATIVE
Nitrite, UA: NEGATIVE
RBC, UA: NEGATIVE
Specific Gravity, UA: >=1.03 — AB (ref 1.005–1.030)
Urobilinogen, Ur: 1 mg/dL (ref 0.2–1.0)
pH, UA: 6 (ref 5.0–7.5)

## 2022-05-03 LAB — HGB A1C W/O EAG: Hgb A1c MFr Bld: 5.8 % — ABNORMAL HIGH (ref 4.8–5.6)

## 2022-05-04 LAB — URINE CYTOLOGY ANCILLARY ONLY
Chlamydia: NEGATIVE
Comment: NEGATIVE
Comment: NORMAL
Neisseria Gonorrhea: NEGATIVE

## 2022-05-04 LAB — CERVICOVAGINAL ANCILLARY ONLY
Bacterial Vaginitis (gardnerella): NEGATIVE
Candida Glabrata: NEGATIVE
Candida Vaginitis: POSITIVE — AB
Comment: NEGATIVE
Comment: NEGATIVE
Comment: NEGATIVE

## 2022-05-05 LAB — MONITOR DRUG PROFILE 14(MW)
Amphetamine Scrn, Ur: NEGATIVE ng/mL
BARBITURATE SCREEN URINE: NEGATIVE ng/mL
BENZODIAZEPINE SCREEN, URINE: NEGATIVE ng/mL
Buprenorphine, Urine: NEGATIVE ng/mL
Cocaine (Metab) Scrn, Ur: NEGATIVE ng/mL
Creatinine(Crt), U: 248.8 mg/dL (ref 20.0–300.0)
Fentanyl, Urine: NEGATIVE pg/mL
Meperidine Screen, Urine: NEGATIVE ng/mL
Methadone Screen, Urine: NEGATIVE ng/mL
OXYCODONE+OXYMORPHONE UR QL SCN: NEGATIVE ng/mL
Opiate Scrn, Ur: NEGATIVE ng/mL
Ph of Urine: 5.7 (ref 4.5–8.9)
Phencyclidine Qn, Ur: NEGATIVE ng/mL
Propoxyphene Scrn, Ur: NEGATIVE ng/mL
SPECIFIC GRAVITY: 1.032
Tramadol Screen, Urine: NEGATIVE ng/mL

## 2022-05-05 LAB — CANNABINOID (GC/MS), URINE
Cannabinoid: POSITIVE — AB
Carboxy THC (GC/MS): 370 ng/mL

## 2022-05-05 LAB — NICOTINE SCREEN, URINE: Cotinine Ql Scrn, Ur: NEGATIVE ng/mL

## 2022-05-06 ENCOUNTER — Other Ambulatory Visit: Payer: Self-pay | Admitting: Advanced Practice Midwife

## 2022-05-06 ENCOUNTER — Encounter: Payer: Self-pay | Admitting: Advanced Practice Midwife

## 2022-05-06 DIAGNOSIS — R8271 Bacteriuria: Secondary | ICD-10-CM

## 2022-05-06 DIAGNOSIS — B3731 Acute candidiasis of vulva and vagina: Secondary | ICD-10-CM

## 2022-05-06 LAB — MATERNIT21 PLUS CORE+SCA
Fetal Fraction: 7
Monosomy X (Turner Syndrome): NOT DETECTED
Result (T21): NEGATIVE
Trisomy 13 (Patau syndrome): NEGATIVE
Trisomy 18 (Edwards syndrome): NEGATIVE
Trisomy 21 (Down syndrome): NEGATIVE
XXX (Triple X Syndrome): NOT DETECTED
XXY (Klinefelter Syndrome): NOT DETECTED
XYY (Jacobs Syndrome): NOT DETECTED

## 2022-05-06 LAB — URINE CULTURE, OB REFLEX

## 2022-05-06 LAB — CULTURE, OB URINE

## 2022-05-06 MED ORDER — AMPICILLIN 500 MG PO CAPS: 500.0000 mg | ORAL_CAPSULE | Freq: Three times a day (TID) | ORAL | 0 refills | Status: AC

## 2022-05-06 MED ORDER — FLUCONAZOLE 150 MG PO TABS: 150.0000 mg | ORAL_TABLET | Freq: Once | ORAL | 1 refills | Status: AC

## 2022-05-06 NOTE — Progress Notes (Signed)
Message to patient regarding GBS in urine (Rx sent) and continued yeast infection (Rx sent).

## 2022-05-09 LAB — CYTOLOGY - PAP
Chlamydia: NEGATIVE
Comment: NEGATIVE
Comment: NEGATIVE
Comment: NORMAL
Diagnosis: NEGATIVE
Neisseria Gonorrhea: NEGATIVE
Trichomonas: POSITIVE — AB

## 2022-05-10 ENCOUNTER — Other Ambulatory Visit: Payer: Self-pay | Admitting: Advanced Practice Midwife

## 2022-05-17 ENCOUNTER — Telehealth (INDEPENDENT_AMBULATORY_CARE_PROVIDER_SITE_OTHER): Payer: Medicaid Other

## 2022-05-17 DIAGNOSIS — Z3480 Encounter for supervision of other normal pregnancy, unspecified trimester: Secondary | ICD-10-CM

## 2022-05-17 DIAGNOSIS — O0992 Supervision of high risk pregnancy, unspecified, second trimester: Secondary | ICD-10-CM

## 2022-05-17 DIAGNOSIS — F1291 Cannabis use, unspecified, in remission: Secondary | ICD-10-CM

## 2022-05-17 DIAGNOSIS — O0990 Supervision of high risk pregnancy, unspecified, unspecified trimester: Secondary | ICD-10-CM | POA: Insufficient documentation

## 2022-05-17 DIAGNOSIS — B379 Candidiasis, unspecified: Secondary | ICD-10-CM

## 2022-05-17 DIAGNOSIS — K117 Disturbances of salivary secretion: Secondary | ICD-10-CM

## 2022-05-17 DIAGNOSIS — Z3A14 14 weeks gestation of pregnancy: Secondary | ICD-10-CM

## 2022-05-17 MED ORDER — GLYCOPYRROLATE 1 MG PO TABS: 1.0000 mg | ORAL_TABLET | Freq: Three times a day (TID) | ORAL | 3 refills | Status: DC

## 2022-05-17 MED ORDER — MONISTAT 1 COMBO PACK 1200 & 2 MG & % VA KIT: 1.0000 | PACK | Freq: Once | VAGINAL | 0 refills | Status: AC

## 2022-05-17 MED ORDER — PRENATAL PLUS 27-1 MG PO TABS: 1.0000 | ORAL_TABLET | Freq: Every day | ORAL | 11 refills | Status: DC

## 2022-05-17 NOTE — Progress Notes (Signed)
New OB Intake  I connected with Erling Conte  on 05/17/22 at  2:15 PM EST by MyChart Video Visit and verified that I am speaking with the correct person using two identifiers. Nurse is located at [facility] and pt is located at home.  I discussed the limitations, risks, security and privacy concerns of performing an evaluation and management service by telephone and the availability of in person appointments. I also discussed with the patient that there may be a patient responsible charge related to this service. The patient expressed understanding and agreed to proceed.  I explained I am completing New OB Intake today. We discussed EDD of 11/12/22 that is based on [place] on 03/24/22. Pt is G3/P1. I reviewed her allergies, medications, Medical/Surgical/OB history, and appropriate screenings. I informed her of [facility] services. [facility] information placed in AVS. Based on history, this is a low risk pregnancy.  Patient Active Problem List   Diagnosis Date Noted   History of gestational diabetes in prior pregnancy, currently pregnant 05/02/2022   Hx of preeclampsia, prior pregnancy, currently pregnant 05/02/2022   Obesity affecting pregnancy 05/02/2022   Supervision of other normal pregnancy, antepartum 04/01/2022   History of marijuana use 11/28/2018   Herpes genitalis 02/21/2018    Concerns addressed today  Delivery Plans Plans to deliver at [facility]. Patient given information for [facility] Healthy Baby website for more information about Women's and [place]. Patient is not interested in water birth. Offered upcoming OB visit with CNM to discuss further.  MyChart/Babyscripts MyChart access verified. I explained pt will have some visits in office and some virtually. Babyscripts instructions given and order placed. Patient verifies receipt of registration text/e-mail. Account successfully created and app downloaded.  Blood Pressure Cuff/Weight Scale Blood pressure cuff ordered for patient to  pick-up from [employer]. Explained after first prenatal appt pt will check weekly and document in 60. Patient does have weight scale.  Anatomy US Explained first scheduled [place] will be around 19 weeks. Anatomy US scheduled for 06/21/22 at 0100p. Pt notified to arrive at 1245p.  Labs Discussed Johnsie Cancel genetic screening with patient. Would like both Panorama and Horizon drawn at new OB visit. Routine prenatal labs needed.  COVID Vaccine Patient has not had COVID vaccine.   Is patient a CenteringPregnancy candidate?  Did not ask, please go over at [facility]     Is patient a Mom+Baby Combined Care candidate?  Not a candidate   If accepted, Mom+Baby staff notified  Social Determinants of Health Food Insecurity: Patient denies food insecurity. WIC Referral: Patient is interested in referral to [facility].  Transportation: Patient denies transportation needs. Childcare: Discussed no children allowed at ultrasound appointments. Offered childcare services; patient declines childcare services at this time.  Interested in [place]? If yes, send referral.   First visit review I reviewed new OB appt with patient. Explained pt will be seen by Dr.Arnold at first visit; encounter routed to appropriate provider. Explained that patient will be seen by pregnancy navigator following visit with provider.   Bethanne Ginger, [place] 05/17/2022  2:24 PM

## 2022-05-17 NOTE — Patient Instructions (Signed)
[place] [facility] Providers  [place] ([address]) [facility] Brown, MD; Chambliss, MD; Eniola, MD; Hensel, MD; McDiarmid, MD; McIntyer, MD [address] [place], [place] [address] [phone] Mon-Fri 8:30-12:30, 1:30-5:00  Providers come to see babies during newborn hospitalization Only accepting infants of Mother's who are seen at Family Medicine Center or have siblings seen at   [facility] Center Medicaid - Yes; Tricare - Yes   [facility] Mulberry, MD [address] [place], [place] [address] [phone] Mon, Tue, Thur, Fri 8:30-5:00, Wed 10:00-7:00 (closed 1-2pm daily for lunch) Takes [place] residents with no insurance.  [place] [facility] only with Medicaid/insurance; Tricare - no  [facility] Ben-Davies, MD; Brown, MD; Chandler, MD; Ettefagh, MD; Grant, MD; Hanvey, MD; Herrin, MD; Jones,  MD; Lester, MD; McCormick, MD; McQueen, MD; Simha, MD; Stanley, MD; Stryffeler, NP [address], [place], [place] [address] [phone] Mon, Tue, Thur, Fri 8:30-5:30, Wed 9:30-5:30, Sat 8:30-12:30 Only accepting infants of first-time parents or siblings of current patients Hospital discharge coordinator will make follow-up appointment Medicaid - yes; Tricare - yes  [place] ([address]) [facility] Cox, MD; Davis, MD; Dovico, MD; Ettefaugh, MD; Lowe, MD; Nation, MD; Slimp, MD; Sumner, MD; Williams, MD [address], [place], [place] [address] [phone] Mon-Fri 8:30-5:00, closed for lunch 12:30-1:30; Sat-Sun 10:00-1:00 Accepting Newborns with commercial insurance only, must call prior to delivery to be accepted into  practice.  Medicaid - no, Tricare - yes   [facility] [address] [place], [place] [address] [phone] or [phone] Mon to Fri 8am to 10pm, Sat 8am to 1pm  (virtual only on weekends) Only accepts Medicaid Healthy Blue pts  [facility]  Artis, MD; Coccaro, MD; [place] Gardner, MD; Netherton, NP; Roper, MD; Wilmot, PA-C; Skinner, MD [address] [place], [place] [address] [phone] Mon-Fri 8:30-5:30 Medicaid - yes, Tricare - yes  [place] ([address]) [facility] Warner, MD [address], [place], [place] [address] [phone] Mon, Tues, Wed Fri 8:30-5:00, Sat 8:30-12:00, Closed Thursdays Accepting siblings of established patients and first time mom's if you call prenatally Medicaid- yes; Tricare - yes  [facility] Becker, PA; Hagler, MD; Quinn, PA-C; Scifres, PA; Sun, MD; Swayne, MD;  [address], [place], [place] [address] [phone] Mon-Fri 8:30-5:00, closed for lunch 1-2 Only accepting newborns of established patients Medicaid- no; Tricare - yes  [place] ([address]) [facility] Timberlake, MD; [address], [place], [place] [address] [phone] Mon-Fri 8:00-5:00 Medicaid - No; Tricare - Yes  [facility] [employer]  Brake, NP; Wharton, PA [address], [place], [place] [address] [phone] Mon-Fri 8:00-5:00 Medicaid - No, Tricare - Yes  [facility] Gay, MD; Quinlan, MD; Blatt, DNP [address] [place], [place] [address] [phone]  Mon-Fri 8:00-5:00 Medicaid - No; Tricare - Yes  [facility] [address] [place], [place] [address] [phone] Mon-Fri 8:30-5:00 (lunch 12:00-1:00) Medicaid -Yes; Tricare - Yes  [facility] Jordan, MD [address], [place], [place] [address] [phone] Mon-Fri 8:00-5:00 Seeing newborns of current patients only. No new patients Medicaid - No, Tricare - yes  [facility] Parker, MD [address] [place], [place] [address] [phone] Mon-Fri 8:00-5:00 Medicaid -yes as secondary coverage only;   Tricare - yes  [facility] Brecken, PA; Christy, NP; Dees, MD; DeClaire, MD; DeWeese, MD; Hodge, PA; Smoot, NP; Summer, MD; Vapne, MD [address] [place], [place] [address] [phone] Mon-Fri 8:30-5:00, Sat 9:00-11:00 Accepts commercial insurance ONLY. Offers free prenatal information sessions for families. Medicaid - No, Tricare - Call first  [facility] Bouska, MD; Gordon, PA; Jeffery, PA; Weber, PA [address] [place] [address] [phone] Mon-Fri 7:30-5:30 Medicaid - Yes; Tricare - yes  [place] ([address] & [address])  [facility] Reese, MD [address] [place], [place] [address] [phone] Mon-Thur 8:00-6:00, closed for lunch 12-2, closed Fridays Medicaid - yes; Tricare - no  [facility] Anderson, NP; Badger, MD; Beal, PA; Spencer, PA [address], [place], [place] [address] [phone] Mon-Fri 7:30-4:30 Medicaid - yes, Tricare - yes  [facility]  Agbuya, MD; Klett, NP; Romgoolam, MD; Rothstein, NP [address], [place], [place] [address] [phone] Mon-Fri 8:30-5:00, closed for lunch 1-2, Sat 8:30-12:00 - sick visits only Providers come to see babies at [facility] Only accepting newborns of siblings and first time parents ONLY if who have met with office prior to delivery Medicaid -Yes; Tricare - yes  [facility] [place]  Golden, DO; Friddle, NP; Wallace, MD; Wood, MD:  [address], [place], [place] [address] [phone] Mon- Fri 8:00-5:00, Sat 9:00-12:00 - sick visits only Accepting siblings of established patients and first time mom/baby Medicaid - Yes; Tricare - yes Patients must have vaccinations (baby vaccines)  [place] ([address] &  [address])  [facility] [facility] [address] [place], [place] [address] [phone] Mon-Fri 8:00-5:00 Medicaid - no; Tricare - yes  [facility] Briscoe, MD; Schmidt, PA; Moreira, PA [address], [place], [place] [address] [phone] Mon-Fri 8:00-5:00 Medicaid- yes; Tricare - yes  [facility] Boyd, MD; Jones, NP; Osborn, PA [address], , [place] [address] [phone] Mon-Fri 8:00-5:00 Medicaid - Yes; Tricare - yes  [place] ([address])  [facility] Atkinson, PA; Calderon, PA; Cummings, MD; Dillard, MD; Henrish, NP; Isenhour, DO; Martin, PA; Olson, MD; Ott, MD; Phillips, MD; Valente, PA; VanDeven, PA; Yonjof, NP [address], [place], [place] [address] [phone] Mon-Fri 8:30-5:00, Sat 9:00-12:00 - sick only Please register online [email or web address] then schedule online or call office Medicaid-Yes; Tricare -yes  [facility]  Dabrusco, MD; Dial, MD; , MD; Fleenor, NP; Goolsby, PA; Tonuzi, MD; Turner, NP; West, MD [address], [place], [place] [address] [phone] Mon-Fri 8:00-5:30, Sat&Sun by appointment (phones open at 8:30) Medicaid - Yes; Tricare - yes  [place] ([address]) [facility] Allen, CPNP; Bates, MD; Gordon, MD; Mills, NP; Weinshilboum, DO [address], [place], [place] [address] [phone] M-F 8:00 - 5:15, Sat/Sun 9-12 sick visits only Medicaid - No; Tricare - yes  [facility] [place] [facility]  Brown, PA-C; Cowen, PA-C; Dennis, DO; Fuster, PA-C; Martin, PA-C; Shelton, PA-C; Spry, MD [address] [place],  [address] [phone] Mon-Thur 8:00-7:00, Fri 8:00-5:00 Accepting Medicaid for 13 and under only   [facility] [place] [facility] [place]) Hayes, FNP; List, FNP; Moran, MD; Pitonzo, PA-C; Scholer,   MD; Spangle, FNP; Nzenwa, FNP; Jasper, MD; Moran, MD [address] [place], [place] [address] [phone] Mon-Fri 8:30-5:30 Medicaid - Yes; Tricare - yes  [facility] [address]  Kelly, CPNP; Logan, MD; Poth, MD; Ramadoss, MD; Staton, NP [address], [place], [place] [address] [phone] Mon-Thur 8:00-5:30, Fri 8:00-5:00, Sat 9:00-12:00 Medicaid - yes, Tricare - yes  [place] ([address])  [facility] [place] Masneri, DO; Meyers, MD; Nelson, PA [address], [place], [place] [address] [phone] Mon-Fri 8:00-5:00, closed for lunch 12-1 Medicaid - No; Tricare - yes  [facility] [place] McGowen, MD [address], [place], [place] [address] [phone] Mon-Fri 8:00-5:00 Medicaid - No; Tricare - yes  [facility] [place] MacDonald, MD; Nayak, MD; Kearns, MD; Jones, MD [address] [place] [address], [place], [place] [address] [phone] Mon-Fri 8:00-5:00 Medicaid- Yes; Tricare - yes  [place] ([address])  [facility] [place] [facility] Martin, PA-C; Tabori, MD [address], [place], [place] [address] [phone] Mon-Fri 8:00-5:00 Medicaid - No; Tricare - yes  [facility] [place]  Margin - CPNP [address], [place], [place] [address] [phone] Mon-Weds 8:00-6:00, Thurs-Fri 8:00-5:00, Sat 9:00-12:00 Medicaid - yes; Tricare - yes   [facility] [place] Aubuchon, MD; Brandon, PA [address] [place], [place] [address] [phone] Mon-Fri 8:00-5:00 Medicaid - yes; Tricare - yes  [place] [facility] Providers  [facility] [address], [place], [place] [address] [phone] M, Thur: 8am -8pm, Tues, Weds: 8am - 5pm; Fri: 8-1 Medicaid - Yes; Tricare -  yes  [facility] Mertz, MD; Johnson, MD; Wells, MD; Downs, PA; Hockenberger, PA [address], [place], [place] [address] [phone] M-F 8:30 - 5:00 Medicaid - Call office; Tricare -yes  [facility] Bonney, MD; Page, MD, Minter, MD; Mueller, PNP; Thomason, NP [address], [place], [place] [address] [phone] M-F 8:30 - 5:00, Sat/Sun 8:30 - 12:30 (sick visits) Medicaid - Call office; Tricare -yes  [facility] Lewis, MD; Shaub, PNP; Boylston, MD; Quaile, PA; Nonato, NP; Landon, CPNP [address], [place], [place] [address] [phone] M-F 8:30 - 5:00 Medicaid - Call office; Tricare - yes  [facility] [place] Cline, MD; Dvergsten, MD; Flores, MD; Kawatu, MD; Nogo, MD [address], [place], [place] [address] [phone] M-Thur: 8:00 - 5:00; Fri: 8:00 - 4:00 Medicaid - yes; Tricare - yes  [facility] [address] [place], [place], [place] [address] [phone] M-F: 8:30- 5:00, closed for lunch 12:30 - 1:00 Medicaid - yes; Tricare -yes  [facility] [place] [address], [place], [place] [address] [phone] M-F 8:00 - 5:00 Medicaid - yes; Tricare - yes  [facility] Johnson, DO; Rumball, DO; Wicker, NP [address] [place], [place], [place] [address] [phone] M-F 8:00 - 5:00, Closed 12-1 for lunch Medicaid - Call; Tricare - yes  [facility] Stein, MD [address], , [place] [address] [phone] M-F: 8:00-5:00, Sat: 8:00 - noon Medicaid - call; Tricare -yes  [place] [facility] Providers  [facility] Collins, FNP-C [address], [place], [place] [address] [phone] M-W: 8:00-5:00, Thur: 8:00 - 7:00, Fri: 8:00 - noon Medicaid - yes; Tricare - yes  [facility] [place] Adams, FNP [address], [place],   [place] [address] [phone] M-F 8:00 - 5:00, Closed for lunch 12-1 Medicaid -  yes; Tricare - yes  [place] [facility] Providers  [facility] Smith, FNP, Melvin, MD, Fay, FNP-C [address] [place] [address] [place], [place], [place] [address] [phone] M-T 8:00-5:00, Wed-Fri 7:00-6:00 Medicaid - Yes; Tricare -yes  [facility] [place] Civiletti, DO; [address], [place], [place] [address] [phone] M-F 8:00 - 5:00, closed for lunch 12-1 Medicaid - Yes; Tricare - yes  [facility]  Barnes, MD; Bergdolt, MD; [place], MD; Emrich, MD; Fiscus, MD; Hoppens, MD; Kylstra, MD, McPherson, MD; Todd, MD; Prestwood, MD; Waters, MD; Wood, MD [address], [place], [place] [address] [phone] M-F 8:00-5:00 Medicaid - yes; Tricare - yes  [facility] Cheema, MD (speaks Punjabi and Hindi) [address] [place], [place] [address] [phone] M-F: 8:30 - 5:00, closed 12:30 - 1 for lunch Medicaid - Yes; Tricare -yes  [place] [facility] Providers  [facility] Loda, MD; [place], MD; Burke, MD [address], [place], [place] [address] [phone] M-Th: 8:00 - 5:30, Fri: 8:00 - 12:00 Medicaid - yes; Tricare - yes  [facility] [place] Lookabill, NP; Meier, MD; Daffron, MD [address], [place], [place] [address] [phone] M-F: 8:00 - 5:00 Medicaid - yes; Tricare - yes  [facility] Busse, NP; Bowman, NP; Baune, NP; Entwistle, MD; Williams, MD, Huffman, NP, Ferguson, MD; Patel, DO [address], [place], [place] [address] [phone] M-F: 8:30 - 5:30p Medicaid - yes; Tricare - yes Other locations available as well  [facility] [facility] Rajan, MD; Wilson, MD; Morgan, PA-C, Domenech, PA-C; Myers, PA-C [address], [place], [place] [address] [phone] M-W: 8:00am - 7:00pm, Thurs: 8:00am - 8:00pm; Fri: 8:00am -  5:00pm, closed daily from 12-1 for lunch Medicaid - yes; Tricare - yes  [place] [facility] Providers  [facility] [place] Adams, MD; Crystal, FNP; Hadley, MD; Stokes, MD; Johnson, PNP; Brady, PA-C; West, PNP; Gardner, MD;  [address], [place], [place] [address] [phone] M - Fri: 8am - 5pm, Sat 9-noon Medicaid - Yes; Tricare -yes  [facility] [place] Nayak, MD; Jones, FNP; McDonald, MD; Kearns, MD [address] [place] [address], [place], [place] [phone] M-F 8:00 - 5:00 Medicaid - call; Tricare - yes  [facility] [place] Bell, MD; Emory, PNP; Pinder, MD; Anderson, MD; Light, PA-C; Johnson, MD; Latta, MD; Saul, PNP; Rainey, MD; Clifford, MD; McClung, MD [address], [place], [place] [address] [phone] M-F 8:00am - 5:00pm; Sat. 9:00 - 11:00 Medicaid - yes; Tricare - yes  [facility] [place] Soldato-Couture, MD [address], [place], [place] [address] [phone] M-F 8:00 - 5:00 Medicaid - [place] Medicaid only; Tricare - yes  [facility] [place] Walker, MD; Davis, PNP; Ajizian, MD [address] [place] [address], [place], [place] [address] [phone] M-F 8:00 - 5:00 Medicaid - yes; Tricare - yes  [facility] [place] Barry, MD; Brown, MD, Forest, MD, Hazek, MD; Hoyle, MD; Smith, MD; [address], [place], [place] [address] [phone] M-F: 8-5 Medicaid - yes; Tricare - yes  [facility] [place] Brady, Md; Dowlen, MD; [address] [place] [address], [place], [place] [address] [phone] M-F 8-5 Medicaid - yes; Tricare - yes  [facility] Kearns, MD; Nayak, MD;   Soldato-Courture, MD; Pellam-Palmer, DNP; Herring, PNP [address], [place], [place] [address] [phone] M-F 8-5 Medicaid - yes; Tricare - yes  [facility] Weathers, MD;  Merritt, PA-C; Davis-PA-C; Warnimont, MD [address], [place], [place] [address] [phone] M-F 7am - 5 pm Medicaid - call; Tricare - yes  [facility] Hill, PNP; Erickson, MD; Robinson, MD [address], [place], [place] [address] [phone] M-F 8-5 Medicaid - yes; Tricare - yes  [facility] Kribbs, MD; Warner, MD; Williams, FNP; Brooks, FNP; Boles, FNP; Romblad, PA-C; [place] - FNP [address], [place], [place] [address] [phone] M-F 8-5 Medicaid- yes; Tricare - yes  [facility] [place] [place] [place] Yoder, MD; Verenes, MD; Armentrout, MD; Stewart, MD; Beasley, CPNP; [place], MD; Erickson, MD; Rice, MD [address], [place], [place] [address] [phone] M-F: 8-5, Sat: 9-4, Sun 9-12 Medicaid - yes; Tricare - yes  [facility] Little, PNP; Davis, PNP [address], [place], [place] [address] [phone] M-F 8 - 5, closed 12-1 for lunch Medicaid - yes; Tricare - yes  [facility] Friesen, MD; Cnegia, MD; Rice, MD; Patel, DO [address], [place], [place] [address] [phone] M-F 8- 5:30 Medicaid - yes; Tricare - yes  [facility] [place] Zvolensky, MD; Ray, MD; Haas, MD [address], [place], [place] [address] [phone] M: 8-7; Tues-Fri: 8-5; Sat: 9-12 Medicaid - yes; Tricare - yes  [facility] [place] Heinrich, MD; Meyer, MD; Clark, MD; Rhyne, MD; Aubuchon, MD [address], [place], [place] [address] [phone] M: 8-7; Tues-Fri: 8-5; Sat: 8:30-12:30 Medicaid - yes; Tricare - yes  [facility] [place] Bista, MD; Dillard, PA [address], [place], [place] [address] [phone] Mon-Fri: 8-5 Medicaid - yes;  Tricare - yes  [facility] [place] Beasley, CPNP; Mahle, CPNP; Rice, MD; Duffy, MD; Culler, MD; [address], [place], [place] [address] [phone] M-F: 8-5, closed 1-2 for lunch Medicaid - yes; Tricare - yes  [facility] Rickman, PA; Mounce, NP; Smith, MD; Jordan, CPNP; Darty, PA; Ball, MD; Wallace, MD [address] [place] [address], [place], [place] [address] [phone] M-Thurs: 8-7; Fri: 8-6; Sat: 9-12; Sun 2-4 Medicaid - yes; Tricare - yes  [facility] Brown, MD; Shin, MD; Goodman, DNP, FNP; Sebesta, DO; [address], [place], [place] [address] [phone] M-F: 8-5 Medicaid - yes; Tricare - yes  [place] [facility] Providers  [facility] Dough, MD; Welsh, NP [address], [place], [place] [address] [phone] M - Fri: 8am - 5pm, closed for lunch 12-1 Medicaid - Yes; Tricare - yes  [facility] Manandhar, MD; Riley, MD; Sanger, DO; Vinocur, MD;Hall, PA; Walsh, PA; Campbell, NP [address], [place]  [address] [phone] M-F 8:00 - 5:00, Sat 8:00 - 11:30 Medicaid - yes; Tricare - yes  [facility] Khan, MD; Redding, MD, Street, MD, Holt, MD, Burgart, MD; Rhyne, NP; Dickinson, PA;  [address] [place] [address], [place],  [address] [phone] M-F 8:10am - 5:00pm Medicaid - yes; Tricare - yes    [facility] Connors, MD; Kime, NP [address], [place], [place] [address] [phone] M-F 8:00 - 5:00 Medicaid - [place] Medicaid only; Tricare - yes  [facility] [place] Whyte, MD; Fox, NP [address], [place], [place] [address] [phone] M-F 8:00 - 5:00; Closed for lunch 12 - 1:00 Medicaid - yes;  Tricare - yes  [facility] Penner, MD; Wilburn, FNP [address], [place], [place] [address] [phone] Mon 9-5; Tues/Wed 10-5; Thurs 8:30-5; Fri: 8-12:30 Medicaid - yes; Tricare - yes  [place] [facility] Providers  [facility]  Golding, MD; Jackson, PA-C [address], [place], [place] [address] [phone] phone [phone] fax M-F 7:15 - 4:30 Medicaid - yes; Tricare - yes  [facility] Gosrani, MD; Meccariello, DO [address] , [place] [address] [phone] M-Fri: 8:30 - 5:00, closed for lunch everyday noon - 1pm Medicaid - Yes; Tricare - yes  [facility] Burdine, MD; Daniel, MD; Howard, MD; Sasser, MD; Boles, PA; [place], PA-C; Carroll, PA; McGee, PA; Skillman, PA; Wilson, PA [address] [place], [place] [address] [phone] M-Thurs: 7:30am - 7:00pm; Friday 7:30am - 4pm; Sat: 8:00 - 1:00 Medicaid - Yes; Tricare - yes  [facility] [place] Akhbari, MD; Law, MD; Qayumi, MD; Salvador, DO [address], [place], [place] [address] [phone] M-Thur: 8:00 - 5:00, Fri: 8:00 - Noon Medicaid - yes; Tricare - yes No [place] [employer]  [facility] Dettinger, MD; Gottschalk, DO; Hawks, NP; Martin, NP; Morgan, NP; Milian, NP; Rakes, NP; Stacks, MD; Webster, PA [address], [place], [place] [address] [phone] M-F 8:00 - 5:00 Medicaid - yes; Tricare - yes  [facility] Collins, FNP-C; Bucio, FNP-C [address], [place], [place] [address] [phone] M, W, R 8:00-5:00, Tues: 8:00am - 7:00pm; Fri 8:00 - noon Medicaid - Yes; Tricare - yes  [facility] Khan, MD [address] [place] [address] [place], [place] [address] [phone]  M-Thurs 8:30-5:30, Fri: 8:30-12:30pm Medicaid - Yes; Tricare - N  

## 2022-05-17 NOTE — Progress Notes (Signed)
Pt advised was not happy with Prenatal care at [facility], Pt would like to get re-tested for Trich. Pt also complained of excessive Saliva, Rx Robinul. Pt also explained prev. Dr. @[facility] Rx her Diflucan for her Yeast infection, Pt states did not feel safe taking so I Rx her Monistat.

## 2022-05-25 ENCOUNTER — Other Ambulatory Visit: Payer: Self-pay

## 2022-05-25 ENCOUNTER — Encounter: Payer: Self-pay | Admitting: Obstetrics & Gynecology

## 2022-05-25 ENCOUNTER — Ambulatory Visit (INDEPENDENT_AMBULATORY_CARE_PROVIDER_SITE_OTHER): Payer: Medicaid Other | Admitting: Obstetrics & Gynecology

## 2022-05-25 ENCOUNTER — Other Ambulatory Visit (HOSPITAL_COMMUNITY)
Admission: RE | Admit: 2022-05-25 | Discharge: 2022-05-25 | Disposition: A | Discharge status: Home / Self Care | Payer: Medicaid Other | Source: Ambulatory Visit | Attending: Obstetrics & Gynecology | Admitting: Obstetrics & Gynecology

## 2022-05-25 VITALS — BP 126/84 | HR 96 | Wt 258.4 lb

## 2022-05-25 DIAGNOSIS — O09292 Supervision of pregnancy with other poor reproductive or obstetric history, second trimester: Secondary | ICD-10-CM

## 2022-05-25 DIAGNOSIS — O09299 Supervision of pregnancy with other poor reproductive or obstetric history, unspecified trimester: Secondary | ICD-10-CM

## 2022-05-25 DIAGNOSIS — Z3A15 15 weeks gestation of pregnancy: Secondary | ICD-10-CM

## 2022-05-25 DIAGNOSIS — O0990 Supervision of high risk pregnancy, unspecified, unspecified trimester: Secondary | ICD-10-CM

## 2022-05-25 DIAGNOSIS — Z8632 Personal history of gestational diabetes: Secondary | ICD-10-CM | POA: Diagnosis not present

## 2022-05-25 DIAGNOSIS — O0992 Supervision of high risk pregnancy, unspecified, second trimester: Secondary | ICD-10-CM

## 2022-05-25 DIAGNOSIS — O99212 Obesity complicating pregnancy, second trimester: Secondary | ICD-10-CM | POA: Diagnosis not present

## 2022-05-25 DIAGNOSIS — O99211 Obesity complicating pregnancy, first trimester: Secondary | ICD-10-CM

## 2022-05-25 MED ORDER — PANTOPRAZOLE SODIUM 20 MG PO TBEC: 20.0000 mg | DELAYED_RELEASE_TABLET | Freq: Every day | ORAL | 6 refills | Status: DC

## 2022-05-25 MED ORDER — ASPIRIN 81 MG PO TBEC: 81.0000 mg | DELAYED_RELEASE_TABLET | Freq: Every day | ORAL | 2 refills | Status: DC

## 2022-05-25 NOTE — Progress Notes (Signed)
Subjective: Transfer from [place] is a [id number] 53w4dbeing seen today for her first obstetrical visit.  Her obstetrical history is significant for obesity and HTN . Patient does intend to breast feed. Pregnancy history fully reviewed.  Patient reports heartburn.  Vitals:   05/25/22 0927  BP: 126/84  Pulse: 96  Weight: 117.2 kg    HISTORY: OB History  Gravida Para Term Preterm AB Living  3 1 1   1 1  $ SAB IAB Ectopic Multiple Live Births  1 0 0   1    # Outcome Date GA Lbr Len/2nd Weight Sex Delivery Anes PTL Lv  3 Current           2 Term 03/23/19 420w2d F CS-LTranv   LIV     Birth Comments: c/s due to heart problems for mom and baby  1 SAB 2014           Past Medical History:  Diagnosis Date   Allergy    seasonal, dusts   Complication of anesthesia    Felt like she couldn't breathe with mask on after 1 procedure   DVT (deep vein thrombosis) in pregnancy    Gestational diabetes    Headache    2-3 per week   Herpes genitalis    MRSA infection    abscesses - history of   Pregnancy induced hypertension    Past Surgical History:  Procedure Laterality Date   CESAREAN SECTION     CHOLECYSTECTOMY, LAPAROSCOPIC     OPEN REDUCTION INTERNAL FIXATION (ORIF) PROXIMAL PHALANX Left 02/04/2022   Procedure: Open reduction and internal fixation of left fifth digit proximal phalanx fracture;  Surgeon: MeHessie KnowsMD;  [place]: [place]ics;  Laterality: Left;   TONSILLECTOMY     Family History  Problem Relation Age of Onset   Bipolar disorder Mother    Heart disease Mother    Heart disease Sister    Hypertension Sister    Diabetes Maternal Grandmother    Hypertension Maternal Grandmother      Exam    Uterus:   16 weeks  Pelvic Exam: Deferred, was done last month                                   Skin: normal coloration and turgor, no rashes    Neurologic: oriented, normal mood   Extremities: normal  strength, tone, and muscle mass   HEENT PERRLA   Mouth/Teeth mucous membranes moist, pharynx normal without lesions   Neck supple   Cardiovascular: regular rate and rhythm   Respiratory:  appears well, vitals normal, no respiratory distress, acyanotic, normal RR   Abdomen: soft, non-tender; bowel sounds normal; no masses,  no organomegaly   Urinary:       Assessment:    Pregnancy: G3P1011 Patient Active Problem List   Diagnosis Date Noted   Supervision of high risk pregnancy, antepartum 05/17/2022   History of gestational diabetes in prior pregnancy, currently pregnant 05/02/2022   Hx of preeclampsia, prior pregnancy, currently pregnant 05/02/2022   Obesity affecting pregnancy 05/02/2022   History of marijuana use 11/28/2018   Herpes genitalis 02/21/2018        Plan:     Initial labs drawn. Prenatal vitamins. Problem list reviewed and updated. Genetic Screening discussed : results reviewed.  Ultrasound discussed; fetal survey: ordered.  Follow up in 4  weeks. 50% of 30 min visit spent on counseling and coordination of care.  ASA daily   Emeterio Reeve 05/25/2022

## 2022-05-26 ENCOUNTER — Encounter: Payer: Self-pay | Admitting: *Deleted

## 2022-05-26 LAB — CERVICOVAGINAL ANCILLARY ONLY
Bacterial Vaginitis (gardnerella): POSITIVE — AB
Candida Glabrata: NEGATIVE
Candida Vaginitis: POSITIVE — AB
Chlamydia: NEGATIVE
Comment: NEGATIVE
Comment: NEGATIVE
Comment: NEGATIVE
Comment: NEGATIVE
Comment: NEGATIVE
Comment: NORMAL
Neisseria Gonorrhea: NEGATIVE
Trichomonas: POSITIVE — AB

## 2022-05-27 LAB — AFP, SERUM, OPEN SPINA BIFIDA
AFP MoM: 1.09
AFP Value: 27.6 ng/mL
Gest. Age on Collection Date: 15.5 weeks
Maternal Age At EDD: 29.5 yr
OSBR Risk 1 IN: 10000
Test Results:: NEGATIVE
Weight: 258 [lb_av]

## 2022-05-28 ENCOUNTER — Other Ambulatory Visit: Payer: Self-pay | Admitting: Obstetrics & Gynecology

## 2022-05-28 ENCOUNTER — Encounter: Payer: Self-pay | Admitting: Obstetrics & Gynecology

## 2022-05-28 DIAGNOSIS — N898 Other specified noninflammatory disorders of vagina: Secondary | ICD-10-CM

## 2022-05-28 MED ORDER — FLUCONAZOLE 150 MG PO TABS: 150.0000 mg | ORAL_TABLET | Freq: Once | ORAL | 0 refills | Status: AC

## 2022-05-28 MED ORDER — METRONIDAZOLE 500 MG PO TABS: 500.0000 mg | ORAL_TABLET | Freq: Two times a day (BID) | ORAL | 0 refills | Status: DC

## 2022-05-28 NOTE — Progress Notes (Signed)
Meds ordered this encounter  Medications   metroNIDAZOLE (FLAGYL) 500 MG tablet    Sig: Take 1 tablet (500 mg total) by mouth 2 (two) times daily for 7 days.    Dispense:  14 tablet    Refill:  0   fluconazole (DIFLUCAN) 150 MG tablet    Sig: Take 1 tablet (150 mg total) by mouth once for 1 dose.    Dispense:  1 tablet    Refill:  0   BV, yeast and trichomonas

## 2022-05-30 ENCOUNTER — Encounter: Payer: Medicaid Other | Admitting: Advanced Practice Midwife

## 2022-05-30 MED ORDER — METRONIDAZOLE 500 MG PO TABS: 500.0000 mg | ORAL_TABLET | Freq: Two times a day (BID) | ORAL | 0 refills | Status: AC

## 2022-05-31 ENCOUNTER — Telehealth: Payer: Self-pay

## 2022-05-31 NOTE — Telephone Encounter (Signed)
Called pt to confirm Rx's.  Pt reports that she was to get tx for Trich, BV, and yeast.  I advised pt that she should have two Rx's as the Flagyl will treat both BV and Trich.  Rx's verified at [facility] on [address]  (Flagyl and Diflucan).  Pt advised to go pick up.  Pt verbalized understanding.   Frances Nickels  05/31/22

## 2022-06-21 ENCOUNTER — Ambulatory Visit: Payer: Medicaid Other

## 2022-06-21 ENCOUNTER — Encounter: Payer: Self-pay | Admitting: Obstetrics and Gynecology

## 2022-06-21 ENCOUNTER — Telehealth (INDEPENDENT_AMBULATORY_CARE_PROVIDER_SITE_OTHER): Payer: Medicaid Other | Admitting: Obstetrics and Gynecology

## 2022-06-21 DIAGNOSIS — O99322 Drug use complicating pregnancy, second trimester: Secondary | ICD-10-CM

## 2022-06-21 DIAGNOSIS — O99212 Obesity complicating pregnancy, second trimester: Secondary | ICD-10-CM

## 2022-06-21 DIAGNOSIS — O0992 Supervision of high risk pregnancy, unspecified, second trimester: Secondary | ICD-10-CM

## 2022-06-21 DIAGNOSIS — Z98891 History of uterine scar from previous surgery: Secondary | ICD-10-CM | POA: Insufficient documentation

## 2022-06-21 DIAGNOSIS — O98312 Other infections with a predominantly sexual mode of transmission complicating pregnancy, second trimester: Secondary | ICD-10-CM

## 2022-06-21 DIAGNOSIS — O34219 Maternal care for unspecified type scar from previous cesarean delivery: Secondary | ICD-10-CM

## 2022-06-21 DIAGNOSIS — O99891 Other specified diseases and conditions complicating pregnancy: Secondary | ICD-10-CM

## 2022-06-21 DIAGNOSIS — O09299 Supervision of pregnancy with other poor reproductive or obstetric history, unspecified trimester: Secondary | ICD-10-CM

## 2022-06-21 DIAGNOSIS — A6000 Herpesviral infection of urogenital system, unspecified: Secondary | ICD-10-CM

## 2022-06-21 DIAGNOSIS — O09292 Supervision of pregnancy with other poor reproductive or obstetric history, second trimester: Secondary | ICD-10-CM

## 2022-06-21 DIAGNOSIS — O99211 Obesity complicating pregnancy, first trimester: Secondary | ICD-10-CM

## 2022-06-21 DIAGNOSIS — L608 Other nail disorders: Secondary | ICD-10-CM

## 2022-06-21 DIAGNOSIS — F1291 Cannabis use, unspecified, in remission: Secondary | ICD-10-CM

## 2022-06-21 DIAGNOSIS — O09212 Supervision of pregnancy with history of pre-term labor, second trimester: Secondary | ICD-10-CM

## 2022-06-21 DIAGNOSIS — E669 Obesity, unspecified: Secondary | ICD-10-CM

## 2022-06-21 DIAGNOSIS — O0990 Supervision of high risk pregnancy, unspecified, unspecified trimester: Secondary | ICD-10-CM

## 2022-06-21 DIAGNOSIS — Z3A19 19 weeks gestation of pregnancy: Secondary | ICD-10-CM

## 2022-06-21 MED ORDER — BLOOD PRESSURE KIT DEVI: 1.0000 | 0 refills | Status: DC | PRN

## 2022-06-21 NOTE — Progress Notes (Signed)
OBSTETRICS PRENATAL VIRTUAL VISIT ENCOUNTER NOTE  Provider location: [facility] [place] [facility]   Patient location: Home  I connected with Erling Conte on 06/21/22 at  2:35 PM EDT by MyChart Video Encounter and verified that I am speaking with the correct person using two identifiers. I discussed the limitations, risks, security and privacy concerns of performing an evaluation and management service virtually and the availability of in person appointments. I also discussed with the patient that there may be a patient responsible charge related to this service. The patient expressed understanding and agreed to proceed. Subjective:  Angela Flynn is a 27 y.o. G3P1011 at 43w3dbeing seen today for ongoing prenatal care.  She is currently monitored for the following issues for this low-risk pregnancy and has Herpes genitalis; History of marijuana use; History of gestational diabetes in prior pregnancy, currently pregnant; Hx of preeclampsia, prior pregnancy, currently pregnant; Obesity affecting pregnancy; Supervision of high risk pregnancy, antepartum; and History of C-section on their problem list.  Patient reports  "knot in her feet'  Has issues with nails - color change.  Contractions: Not present. Vag. Bleeding: None.  Movement: Present. Denies any leaking of fluid.   The following portions of the patient's history were reviewed and updated as appropriate: allergies, current medications, past family history, past medical history, past social history, past surgical history and problem list.   Objective:  There were no vitals filed for this visit. No BP cuff.   Fetal Status:     Movement: Present     General:  Alert, oriented and cooperative. Patient is in no acute distress.  Respiratory: Normal respiratory effort, no problems with respiration noted  Mental Status: Normal mood and affect. Normal behavior. Normal judgment and thought content.  Rest of  physical exam deferred due to type of encounter  Imaging: No results found.  Assessment and Plan:  Pregnancy: G3P1011 at 168w3d. Genital herpes simplex, unspecified site Valtrex prophy at 35-36w  2. History of marijuana use None since + UPT. Unable to tolerate.  3. History of gestational diabetes in prior pregnancy, currently pregnant Early A1C was 5.8  4. Hx of preeclampsia, prior pregnancy, currently pregnant Taking ldASA  5. Obesity affecting pregnancy in first trimester, unspecified obesity type  6. Supervision of high risk pregnancy, antepartum Anatomy US[place]ext week.  AFP wnl Schedule for 2 hr since A1C was 5.8.  She will pick up BP cuff as instructed.  Reviewed measures to help with feet - will refer to podiatry.   Preterm labor symptoms and general obstetric precautions including but not limited to vaginal bleeding, contractions, leaking of fluid and fetal movement were reviewed in detail with the patient. I discussed the assessment and treatment plan with the patient. The patient was provided an opportunity to ask questions and all were answered. The patient agreed with the plan and demonstrated an understanding of the instructions. The patient was advised to call back or seek an in-person office evaluation/[facility] or concerning symptoms. Please refer to After Visit Summary for other counseling recommendations.   I provided 27 minutes of face-to-face time during this encounter.  Return in about 4 weeks (around 07/19/2022) for LROB VISIT, MD or APP.  Future Appointments  Date Time Provider De[place]3/22/2024 10:15 AM [facility]3/22/2024 10:30 AM [facility]-M[facility] US3 [facility]  PaRadene GunningMDPot[place] Valley[facility]

## 2022-06-27 ENCOUNTER — Ambulatory Visit: Payer: Medicaid Other | Admitting: Podiatry

## 2022-06-27 ENCOUNTER — Other Ambulatory Visit: Payer: Self-pay | Admitting: *Deleted

## 2022-06-27 ENCOUNTER — Encounter: Payer: Self-pay | Admitting: *Deleted

## 2022-06-27 ENCOUNTER — Ambulatory Visit: Payer: Medicaid Other | Admitting: *Deleted

## 2022-06-27 ENCOUNTER — Ambulatory Visit: Payer: Medicaid Other | Attending: Obstetrics & Gynecology

## 2022-06-27 VITALS — BP 140/70

## 2022-06-27 DIAGNOSIS — Z3689 Encounter for other specified antenatal screening: Secondary | ICD-10-CM | POA: Diagnosis present

## 2022-06-27 DIAGNOSIS — O09292 Supervision of pregnancy with other poor reproductive or obstetric history, second trimester: Secondary | ICD-10-CM | POA: Diagnosis not present

## 2022-06-27 DIAGNOSIS — Z3480 Encounter for supervision of other normal pregnancy, unspecified trimester: Secondary | ICD-10-CM | POA: Insufficient documentation

## 2022-06-27 DIAGNOSIS — O99212 Obesity complicating pregnancy, second trimester: Secondary | ICD-10-CM

## 2022-06-27 DIAGNOSIS — Z3A21 21 weeks gestation of pregnancy: Secondary | ICD-10-CM | POA: Insufficient documentation

## 2022-06-27 DIAGNOSIS — Z362 Encounter for other antenatal screening follow-up: Secondary | ICD-10-CM

## 2022-06-27 DIAGNOSIS — O1492 Unspecified pre-eclampsia, second trimester: Secondary | ICD-10-CM | POA: Diagnosis not present

## 2022-06-27 DIAGNOSIS — O34219 Maternal care for unspecified type scar from previous cesarean delivery: Secondary | ICD-10-CM

## 2022-06-27 DIAGNOSIS — Z363 Encounter for antenatal screening for malformations: Secondary | ICD-10-CM | POA: Insufficient documentation

## 2022-06-27 DIAGNOSIS — O24112 Pre-existing diabetes mellitus, type 2, in pregnancy, second trimester: Secondary | ICD-10-CM | POA: Diagnosis not present

## 2022-06-27 DIAGNOSIS — O321XX0 Maternal care for breech presentation, not applicable or unspecified: Secondary | ICD-10-CM | POA: Diagnosis not present

## 2022-06-27 DIAGNOSIS — O99320 Drug use complicating pregnancy, unspecified trimester: Secondary | ICD-10-CM

## 2022-07-01 ENCOUNTER — Ambulatory Visit: Payer: Medicaid Other

## 2022-07-01 ENCOUNTER — Other Ambulatory Visit: Payer: Medicaid Other

## 2022-07-11 ENCOUNTER — Ambulatory Visit (INDEPENDENT_AMBULATORY_CARE_PROVIDER_SITE_OTHER): Payer: Medicaid Other | Admitting: Podiatry

## 2022-07-11 DIAGNOSIS — Z91199 Patient's noncompliance with other medical treatment and regimen due to unspecified reason: Secondary | ICD-10-CM

## 2022-07-11 NOTE — Progress Notes (Signed)
No show

## 2022-07-13 ENCOUNTER — Telehealth: Payer: Self-pay

## 2022-07-13 NOTE — Telephone Encounter (Signed)
Alert received on 07/10/2022 through [facility] system as follows: Patient identified as having high BP reading (140/70) on 06/27/2022  Chart review: No blood pressure follow up documented.  Action needed: called patient and got follow up appointment scheduled.  Caryl Ada, CMA 07/13/2022  1:15 PM

## 2022-07-25 ENCOUNTER — Ambulatory Visit: Payer: Medicaid Other

## 2022-07-28 ENCOUNTER — Encounter: Payer: Self-pay | Admitting: Obstetrics and Gynecology

## 2022-07-28 ENCOUNTER — Encounter: Payer: Medicaid Other | Admitting: Obstetrics and Gynecology

## 2022-07-31 NOTE — Progress Notes (Signed)
Patient did not keep her OB appointment for 07/28/2022.  Cornelia Copa MD Attending Center for [employer] [facility])

## 2022-08-03 ENCOUNTER — Other Ambulatory Visit: Payer: Self-pay | Admitting: *Deleted

## 2022-08-03 DIAGNOSIS — O283 Abnormal ultrasonic finding on antenatal screening of mother: Secondary | ICD-10-CM | POA: Insufficient documentation

## 2022-08-05 ENCOUNTER — Other Ambulatory Visit: Payer: Self-pay | Admitting: *Deleted

## 2022-08-05 ENCOUNTER — Ambulatory Visit (HOSPITAL_BASED_OUTPATIENT_CLINIC_OR_DEPARTMENT_OTHER): Payer: Medicaid Other

## 2022-08-05 ENCOUNTER — Other Ambulatory Visit: Payer: Self-pay | Admitting: Advanced Practice Midwife

## 2022-08-05 ENCOUNTER — Ambulatory Visit: Payer: Medicaid Other | Attending: Obstetrics | Admitting: *Deleted

## 2022-08-05 ENCOUNTER — Other Ambulatory Visit: Payer: Self-pay | Admitting: Internal Medicine

## 2022-08-05 VITALS — BP 132/60 | HR 85

## 2022-08-05 DIAGNOSIS — O99212 Obesity complicating pregnancy, second trimester: Secondary | ICD-10-CM

## 2022-08-05 DIAGNOSIS — O34219 Maternal care for unspecified type scar from previous cesarean delivery: Secondary | ICD-10-CM

## 2022-08-05 DIAGNOSIS — O09292 Supervision of pregnancy with other poor reproductive or obstetric history, second trimester: Secondary | ICD-10-CM

## 2022-08-05 DIAGNOSIS — O99322 Drug use complicating pregnancy, second trimester: Secondary | ICD-10-CM | POA: Diagnosis not present

## 2022-08-05 DIAGNOSIS — Z113 Encounter for screening for infections with a predominantly sexual mode of transmission: Secondary | ICD-10-CM

## 2022-08-05 DIAGNOSIS — Z3A26 26 weeks gestation of pregnancy: Secondary | ICD-10-CM | POA: Insufficient documentation

## 2022-08-05 DIAGNOSIS — O09299 Supervision of pregnancy with other poor reproductive or obstetric history, unspecified trimester: Secondary | ICD-10-CM

## 2022-08-05 DIAGNOSIS — F1290 Cannabis use, unspecified, uncomplicated: Secondary | ICD-10-CM | POA: Insufficient documentation

## 2022-08-05 DIAGNOSIS — O283 Abnormal ultrasonic finding on antenatal screening of mother: Secondary | ICD-10-CM

## 2022-08-05 DIAGNOSIS — Z362 Encounter for other antenatal screening follow-up: Secondary | ICD-10-CM

## 2022-08-05 DIAGNOSIS — E669 Obesity, unspecified: Secondary | ICD-10-CM

## 2022-08-05 DIAGNOSIS — A6004 Herpesviral vulvovaginitis: Secondary | ICD-10-CM

## 2022-08-05 NOTE — Telephone Encounter (Signed)
Requested medications are due for refill today.  unsure  Requested medications are on the active medications list.  yes  Last refill. unsure  Future visit scheduled.   no  Notes to clinic.  Historical medication. PCP listed as Wilhelmina Mcardle    Requested Prescriptions  Pending Prescriptions Disp Refills   valACYclovir (VALTREX) 1000 MG tablet [Pharmacy Med Name: VALACYCLOVIR 1GM TABLETS] 30 tablet 5    Sig: TAKE 1 TABLET(1000 MG) BY MOUTH DAILY     Antimicrobials:  Antiviral Agents - Anti-Herpetic Failed - 08/05/2022  3:13 PM      Failed - Valid encounter within last 12 months    Recent Outpatient Visits           1 year ago Easy bruising   [place] [facility], Salvadore Oxford, NP   1 year ago Acute non-recurrent frontal sinusitis   [place] [facility] Smitty Cords, DO   2 years ago Generalized abdominal pain   [place] [facility] Gabriel Cirri, NP   2 years ago Acute non-recurrent maxillary sinusitis   [place] [facility] Smitty Cords, DO   4 years ago Generalized abdominal pain   [place] [facility] Kyung Rudd, Alison Stalling, NP

## 2022-08-09 ENCOUNTER — Other Ambulatory Visit (HOSPITAL_COMMUNITY)
Admission: RE | Admit: 2022-08-09 | Discharge: 2022-08-09 | Disposition: A | Discharge status: Home / Self Care | Payer: Medicaid Other | Source: Ambulatory Visit | Attending: Obstetrics and Gynecology | Admitting: Obstetrics and Gynecology

## 2022-08-09 ENCOUNTER — Encounter: Payer: Self-pay | Admitting: Family Medicine

## 2022-08-09 ENCOUNTER — Ambulatory Visit (INDEPENDENT_AMBULATORY_CARE_PROVIDER_SITE_OTHER): Payer: Medicaid Other | Admitting: Family Medicine

## 2022-08-09 VITALS — BP 122/81 | HR 101 | Wt 293.8 lb

## 2022-08-09 DIAGNOSIS — A599 Trichomoniasis, unspecified: Secondary | ICD-10-CM | POA: Insufficient documentation

## 2022-08-09 DIAGNOSIS — O283 Abnormal ultrasonic finding on antenatal screening of mother: Secondary | ICD-10-CM

## 2022-08-09 DIAGNOSIS — O09299 Supervision of pregnancy with other poor reproductive or obstetric history, unspecified trimester: Secondary | ICD-10-CM

## 2022-08-09 DIAGNOSIS — Z3A27 27 weeks gestation of pregnancy: Secondary | ICD-10-CM

## 2022-08-09 DIAGNOSIS — O99213 Obesity complicating pregnancy, third trimester: Secondary | ICD-10-CM

## 2022-08-09 DIAGNOSIS — Z8632 Personal history of gestational diabetes: Secondary | ICD-10-CM

## 2022-08-09 DIAGNOSIS — Z98891 History of uterine scar from previous surgery: Secondary | ICD-10-CM

## 2022-08-09 DIAGNOSIS — O0992 Supervision of high risk pregnancy, unspecified, second trimester: Secondary | ICD-10-CM

## 2022-08-09 DIAGNOSIS — O09292 Supervision of pregnancy with other poor reproductive or obstetric history, second trimester: Secondary | ICD-10-CM

## 2022-08-09 DIAGNOSIS — Z8619 Personal history of other infectious and parasitic diseases: Secondary | ICD-10-CM

## 2022-08-09 DIAGNOSIS — O99212 Obesity complicating pregnancy, second trimester: Secondary | ICD-10-CM

## 2022-08-09 DIAGNOSIS — O0990 Supervision of high risk pregnancy, unspecified, unspecified trimester: Secondary | ICD-10-CM

## 2022-08-09 MED ORDER — VALACYCLOVIR HCL 500 MG PO TABS
500.0000 mg | ORAL_TABLET | Freq: Two times a day (BID) | ORAL | 2 refills | Status: DC
Start: 2022-08-09 — End: 2022-09-27

## 2022-08-09 MED ORDER — BLOOD PRESSURE KIT DEVI: 1.0000 | 0 refills | Status: DC | PRN

## 2022-08-09 MED ORDER — ASPIRIN 81 MG PO TBEC
162.0000 mg | DELAYED_RELEASE_TABLET | Freq: Every day | ORAL | 5 refills | Status: DC
Start: 2022-08-09 — End: 2022-11-07

## 2022-08-09 NOTE — Progress Notes (Unsigned)
   Subjective:  Angela Flynn is a 27 y.o. G3P1011 at [redacted]w[redacted]d being seen today for ongoing prenatal care.  She is currently monitored for the following issues for this high-risk pregnancy and has History of marijuana use; History of gestational diabetes in prior pregnancy, currently pregnant; Hx of preeclampsia, prior pregnancy, currently pregnant; Obesity affecting pregnancy; Supervision of high risk pregnancy, antepartum; History of C-section; Echogenic intracardiac focus of fetus on prenatal ultrasound; and Trichomonas infection on their problem list.  Patient reports multiple issues, see below.  Contractions: Irritability. Vag. Bleeding: None.  Movement: Present. Denies leaking of fluid.   The following portions of the patient's history were reviewed and updated as appropriate: allergies, current medications, past family history, past medical history, past social history, past surgical history and problem list. Problem list updated.  Objective:   Vitals:   08/09/22 1534  BP: 122/81  Pulse: (!) 101  Weight: 293 lb 12.8 oz (133.3 kg)    Fetal Status: Fetal Heart Rate (bpm): 134   Movement: Present     General:  Alert, oriented and cooperative. Patient is in no acute distress.  Skin: Skin is warm and dry. No rash noted.   Cardiovascular: Normal heart rate noted  Respiratory: Normal respiratory effort, no problems with respiration noted  Abdomen: Soft, gravid, appropriate for gestational age. Pain/Pressure: Present     Pelvic: Vag. Bleeding: None Vag D/C Character: Yellow (with vaginal itching)   Cervical exam deferred        Extremities: Normal range of motion.  Edema: Mild pitting, slight indentation  Mental Status: Normal mood and affect. Normal behavior. Normal judgment and thought content.   Urinalysis:      Assessment and Plan:  Pregnancy: G3P1011 at [redacted]w[redacted]d  1. Supervision of high risk pregnancy, antepartum BP and FHR normal Will schedule for third trimester labs at next  visit Declines tdap today, offer again next visit - Blood Pressure Monitoring (BLOOD PRESSURE KIT) DEVI; 1 Device by Does not apply route as needed.  Dispense: 1 each; Refill: 0  2. History of herpes genitalis Refill sent per request - valACYclovir (VALTREX) 500 MG tablet; Take 1 tablet (500 mg total) by mouth 2 (two) times daily.  Dispense: 60 tablet; Refill: 2  3. History of C-section Op note reviewed in Care Everywhere, LTCS, no contraindication to vaginal delivery TOLAC consent form signed  4. Echogenic intracardiac focus of fetus on prenatal ultrasound Low risk Materni 21, otherwise normal anatomy Following w MFM  5. History of gestational diabetes in prior pregnancy, currently pregnant New OB A1c was 5.8%, prediabetic range Discussed importance of retesting asap, will coordinate at checkout  6. Hx of preeclampsia, prior pregnancy, currently pregnant Encouraged to take, increased dose to 182 mg daily  7. Obesity affecting pregnancy in third trimester, unspecified obesity type Prescribed ASA but not taking, encouraged to do so  8. Trichomonas infection Positive in January and February Has ongoing itching and yellow discharge Reports she took full course of treatment in February Will retest today  Preterm labor symptoms and general obstetric precautions including but not limited to vaginal bleeding, contractions, leaking of fluid and fetal movement were reviewed in detail with the patient. Please refer to After Visit Summary for other counseling recommendations.  Return in 2 weeks (on 08/23/2022) for [facility], ob visit.   Venora Maples, MD

## 2022-08-09 NOTE — Patient Instructions (Signed)

## 2022-08-11 LAB — CERVICOVAGINAL ANCILLARY ONLY
Bacterial Vaginitis (gardnerella): NEGATIVE
Candida Glabrata: POSITIVE — AB
Candida Vaginitis: POSITIVE — AB
Chlamydia: NEGATIVE
Comment: NEGATIVE
Comment: NEGATIVE
Comment: NEGATIVE
Comment: NEGATIVE
Comment: NEGATIVE
Comment: NORMAL
Neisseria Gonorrhea: NEGATIVE
Trichomonas: NEGATIVE

## 2022-08-11 MED ORDER — FLUCONAZOLE 150 MG PO TABS: 150.0000 mg | ORAL_TABLET | Freq: Once | ORAL | 0 refills | Status: AC

## 2022-08-11 NOTE — Addendum Note (Signed)
Addended by: Merian Capron on: 08/11/2022 04:23 PM   Modules accepted: Orders

## 2022-08-19 ENCOUNTER — Other Ambulatory Visit: Payer: Medicaid Other

## 2022-08-23 ENCOUNTER — Other Ambulatory Visit: Payer: Self-pay

## 2022-08-23 ENCOUNTER — Ambulatory Visit (INDEPENDENT_AMBULATORY_CARE_PROVIDER_SITE_OTHER): Payer: Medicaid Other | Admitting: Family Medicine

## 2022-08-23 VITALS — BP 131/69 | HR 106 | Wt 300.9 lb

## 2022-08-23 DIAGNOSIS — B3731 Acute candidiasis of vulva and vagina: Secondary | ICD-10-CM

## 2022-08-23 DIAGNOSIS — Z3A29 29 weeks gestation of pregnancy: Secondary | ICD-10-CM

## 2022-08-23 DIAGNOSIS — O99213 Obesity complicating pregnancy, third trimester: Secondary | ICD-10-CM

## 2022-08-23 DIAGNOSIS — O0990 Supervision of high risk pregnancy, unspecified, unspecified trimester: Secondary | ICD-10-CM

## 2022-08-23 DIAGNOSIS — O0993 Supervision of high risk pregnancy, unspecified, third trimester: Secondary | ICD-10-CM

## 2022-08-23 DIAGNOSIS — O99211 Obesity complicating pregnancy, first trimester: Secondary | ICD-10-CM

## 2022-08-23 MED ORDER — TERCONAZOLE 0.4 % VA CREA
1.0000 | TOPICAL_CREAM | Freq: Every day | VAGINAL | 0 refills | Status: DC
Start: 2022-08-23 — End: 2022-09-27

## 2022-08-23 MED ORDER — CLOTRIMAZOLE 3 2 % VA CREA
1.0000 | TOPICAL_CREAM | Freq: Every day | VAGINAL | 0 refills | Status: DC
Start: 2022-08-23 — End: 2022-08-23

## 2022-08-23 NOTE — Progress Notes (Addendum)
Pt reports sharpe pains in abdomen.Pt also wants to make sure that she gets treatment for abnormal labs from 08/09/22.

## 2022-08-23 NOTE — Progress Notes (Signed)
   PRENATAL VISIT NOTE  Subjective:  Angela Flynn is a 27 y.o. G3P1011 at [redacted]w[redacted]d being seen today for ongoing prenatal care.  She is currently monitored for the following issues for this high-risk pregnancy and has History of gestational diabetes in prior pregnancy, currently pregnant; Hx of preeclampsia, prior pregnancy, currently pregnant; Obesity affecting pregnancy; Supervision of high risk pregnancy, antepartum; History of C-section; Echogenic intracardiac focus of fetus on prenatal ultrasound; and Trichomonas infection on their problem list.  Patient reports vaginal irritation. Yeast  Contractions: Not present. Vag. Bleeding: None.  Movement: Present. Denies leaking of fluid.   The following portions of the patient's history were reviewed and updated as appropriate: allergies, current medications, past family history, past medical history, past social history, past surgical history and problem list.   Objective:   Vitals:   08/23/22 1550 08/23/22 1611  BP: (!) 147/79 131/69  Pulse: 92 (!) 106  Weight: (!) 300 lb 14.4 oz (136.5 kg)     Fetal Status: Fetal Heart Rate (bpm): 136   Movement: Present     General:  Alert, oriented and cooperative. Patient is in no acute distress.  Skin: Skin is warm and dry. No rash noted.   Cardiovascular: Normal heart rate noted  Respiratory: Normal respiratory effort, no problems with respiration noted  Abdomen: Soft, gravid, appropriate for gestational age.  Pain/Pressure: Present     Pelvic: Cervical exam deferred        Extremities: Normal range of motion.  Edema: Trace  Mental Status: Normal mood and affect. Normal behavior. Normal judgment and thought content.   Assessment and Plan:  Pregnancy: G3P1011 at [redacted]w[redacted]d  1. Supervision of high risk pregnancy, antepartum  2. Obesity affecting pregnancy in first trimester, unspecified obesity type  3. Vulvovaginal candidiasis - terconazole (TERAZOL 7) 0.4 % vaginal cream; Place 1 applicator  vaginally at bedtime.  Dispense: 45 g; Refill: 0   Terconazole sent. Taking ASA. Glucola scheduled, can bring daughter for test.   Preterm labor symptoms and general obstetric precautions including but not limited to vaginal bleeding, contractions, leaking of fluid and fetal movement were reviewed in detail with the patient. Please refer to After Visit Summary for other counseling recommendations.   Future Appointments  Date Time Provider Department Center  08/26/2022  8:20 AM [facility]  09/07/2022  3:45 PM [facility] US1 [facility]  09/14/2022  3:55 PM Warden Fillers, MD [facility]  09/30/2022  3:30 PM [facility] US3 [facility]  10/07/2022  3:15 PM W[facility]-M[facility] NST [facility]  10/14/2022  3:15 PM [facility] NST [facility]  10/21/2022  3:15 PM [facility] NST [facility]  10/28/2022  3:45 PM [facility] US5 [facility]  11/04/2022  3:15 PM [facility] NST [facility]  11/11/2022  3:15 PM W[facility]-MFC NST W[facility]-MFC W[facility]    Myrtie Hawk, DO FMOB Fellow, Faculty practice [facility], Center for [facility] 08/23/22  5:35 PM

## 2022-08-26 ENCOUNTER — Other Ambulatory Visit: Payer: Medicaid Other

## 2022-08-26 ENCOUNTER — Other Ambulatory Visit: Payer: Self-pay

## 2022-08-26 DIAGNOSIS — O0990 Supervision of high risk pregnancy, unspecified, unspecified trimester: Secondary | ICD-10-CM

## 2022-09-07 ENCOUNTER — Ambulatory Visit: Payer: Medicaid Other | Attending: Maternal & Fetal Medicine

## 2022-09-07 DIAGNOSIS — O99212 Obesity complicating pregnancy, second trimester: Secondary | ICD-10-CM | POA: Diagnosis present

## 2022-09-07 DIAGNOSIS — Z8632 Personal history of gestational diabetes: Secondary | ICD-10-CM | POA: Diagnosis present

## 2022-09-07 DIAGNOSIS — Z362 Encounter for other antenatal screening follow-up: Secondary | ICD-10-CM | POA: Insufficient documentation

## 2022-09-07 DIAGNOSIS — O99323 Drug use complicating pregnancy, third trimester: Secondary | ICD-10-CM | POA: Diagnosis not present

## 2022-09-07 DIAGNOSIS — O09299 Supervision of pregnancy with other poor reproductive or obstetric history, unspecified trimester: Secondary | ICD-10-CM | POA: Insufficient documentation

## 2022-09-07 DIAGNOSIS — O99213 Obesity complicating pregnancy, third trimester: Secondary | ICD-10-CM

## 2022-09-07 DIAGNOSIS — O34219 Maternal care for unspecified type scar from previous cesarean delivery: Secondary | ICD-10-CM

## 2022-09-07 DIAGNOSIS — F1290 Cannabis use, unspecified, uncomplicated: Secondary | ICD-10-CM | POA: Diagnosis not present

## 2022-09-07 DIAGNOSIS — O09293 Supervision of pregnancy with other poor reproductive or obstetric history, third trimester: Secondary | ICD-10-CM

## 2022-09-07 DIAGNOSIS — E669 Obesity, unspecified: Secondary | ICD-10-CM

## 2022-09-07 DIAGNOSIS — Z3A31 31 weeks gestation of pregnancy: Secondary | ICD-10-CM

## 2022-09-14 ENCOUNTER — Other Ambulatory Visit: Payer: Self-pay

## 2022-09-14 ENCOUNTER — Ambulatory Visit (INDEPENDENT_AMBULATORY_CARE_PROVIDER_SITE_OTHER): Payer: Medicaid Other | Admitting: Obstetrics and Gynecology

## 2022-09-14 VITALS — BP 139/86 | HR 101 | Wt 301.1 lb

## 2022-09-14 DIAGNOSIS — O0993 Supervision of high risk pregnancy, unspecified, third trimester: Secondary | ICD-10-CM

## 2022-09-14 DIAGNOSIS — O0990 Supervision of high risk pregnancy, unspecified, unspecified trimester: Secondary | ICD-10-CM

## 2022-09-14 DIAGNOSIS — Z3A32 32 weeks gestation of pregnancy: Secondary | ICD-10-CM

## 2022-09-14 DIAGNOSIS — Z98891 History of uterine scar from previous surgery: Secondary | ICD-10-CM

## 2022-09-14 DIAGNOSIS — Z8632 Personal history of gestational diabetes: Secondary | ICD-10-CM

## 2022-09-14 DIAGNOSIS — O99213 Obesity complicating pregnancy, third trimester: Secondary | ICD-10-CM

## 2022-09-14 DIAGNOSIS — O09299 Supervision of pregnancy with other poor reproductive or obstetric history, unspecified trimester: Secondary | ICD-10-CM

## 2022-09-14 DIAGNOSIS — O09293 Supervision of pregnancy with other poor reproductive or obstetric history, third trimester: Secondary | ICD-10-CM

## 2022-09-14 NOTE — Progress Notes (Signed)
Pain C section scar, it feels like its going to rupture, swelling \\in  legs, and ankles, hands numb

## 2022-09-14 NOTE — Progress Notes (Signed)
   PRENATAL VISIT NOTE  Subjective:  Angela Flynn is a 27 y.o. G3P1011 at [redacted]w[redacted]d being seen today for ongoing prenatal care.  She is currently monitored for the following issues for this high-risk pregnancy and has History of gestational diabetes in prior pregnancy, currently pregnant; Hx of preeclampsia, prior pregnancy, currently pregnant; Obesity affecting pregnancy; Supervision of high risk pregnancy, antepartum; History of C-section; Echogenic intracardiac focus of fetus on prenatal ultrasound; and Trichomonas infection on their problem list.  Patient doing well with no acute concerns today. She reports carpal tunnel symptoms and lower extremity swelling .  Contractions: Not present. Vag. Bleeding: None.  Movement: Present. Denies leaking of fluid. Pt also concerned regarding abdominal discomfort.  The following portions of the patient's history were reviewed and updated as appropriate: allergies, current medications, past family history, past medical history, past social history, past surgical history and problem list. Problem list updated.  Objective:   Vitals:   09/14/22 1611 09/14/22 1644  BP: (!) 136/94 139/86  Pulse: (!) 101   Weight: (!) 301 lb 1.6 oz (136.6 kg)     Fetal Status: Fetal Heart Rate (bpm): 144 (Simultaneous filing. User may not have seen previous data.) Fundal Height: 33 cm Movement: Present     General:  Alert, oriented and cooperative. Patient is in no acute distress.  Skin: Skin is warm and dry. No rash noted.   Cardiovascular: Normal heart rate noted  Respiratory: Normal respiratory effort, no problems with respiration noted  Abdomen: Soft, gravid, appropriate for gestational age.  Pain/Pressure: Present (in scar and sometimes from mid belly button down to c section scar)     Pelvic: Cervical exam deferred        Extremities: Normal range of motion.  Edema: Other (Comment) (legs and ankles)  Mental Status:  Normal mood and affect. Normal behavior.  Normal judgment and thought content.   Assessment and Plan:  Pregnancy: G3P1011 at [redacted]w[redacted]d  1. [redacted] weeks gestation of pregnancy   2. Supervision of high risk pregnancy, antepartum Continue routine prenatal care Pt concerned about her pregnancy due to complications in previous pregnancy.  Will get weekly NST until BPP start at 34 weeks  - RPR - HIV Antibody (routine testing w rflx) - CBC  3. Obesity affecting pregnancy in third trimester, unspecified obesity type   4. Hx of preeclampsia, prior pregnancy, currently pregnant Pt's BP is borderline , she has no other symptoms, but will get preeclampsia labs today - Comp Met (CMET) - Protein / creatinine ratio, urine  5. History of gestational diabetes in prior pregnancy, currently pregnant Pt will reschedule to get 2 hour GTT completed  6. History of C-section   Preterm labor symptoms and general obstetric precautions including but not limited to vaginal bleeding, contractions, leaking of fluid and fetal movement were reviewed in detail with the patient.  Please refer to After Visit Summary for other counseling recommendations.   Return in about 2 weeks (around 09/28/2022) for [facility], in person.   Mariel Aloe, MD Faculty Attending Center for [facility]

## 2022-09-15 LAB — RPR: RPR Ser Ql: NONREACTIVE

## 2022-09-15 LAB — COMPREHENSIVE METABOLIC PANEL
ALT: 5 IU/L (ref 0–32)
AST: 7 IU/L (ref 0–40)
Albumin/Globulin Ratio: 1.4 (ref 1.2–2.2)
Albumin: 3.8 g/dL — ABNORMAL LOW (ref 4.0–5.0)
Alkaline Phosphatase: 117 IU/L (ref 44–121)
BUN/Creatinine Ratio: 16 (ref 9–23)
BUN: 6 mg/dL (ref 6–20)
Bilirubin Total: 0.2 mg/dL (ref 0.0–1.2)
CO2: 18 mmol/L — ABNORMAL LOW (ref 20–29)
Calcium: 9.3 mg/dL (ref 8.7–10.2)
Chloride: 102 mmol/L (ref 96–106)
Creatinine, Ser: 0.38 mg/dL — ABNORMAL LOW (ref 0.57–1.00)
Globulin, Total: 2.7 g/dL (ref 1.5–4.5)
Glucose: 82 mg/dL (ref 70–99)
Potassium: 4.2 mmol/L (ref 3.5–5.2)
Sodium: 135 mmol/L (ref 134–144)
Total Protein: 6.5 g/dL (ref 6.0–8.5)
eGFR: 139 mL/min/{1.73_m2} (ref >59)

## 2022-09-15 LAB — HIV ANTIBODY (ROUTINE TESTING W REFLEX): HIV Screen 4th Generation wRfx: NONREACTIVE

## 2022-09-15 LAB — CBC
Hematocrit: 35.9 % (ref 34.0–46.6)
Hemoglobin: 12.3 g/dL (ref 11.1–15.9)
MCH: 29.2 pg (ref 26.6–33.0)
MCHC: 34.3 g/dL (ref 31.5–35.7)
MCV: 85 fL (ref 79–97)
Platelets: 317 10*3/uL (ref 150–450)
RBC: 4.21 x10E6/uL (ref 3.77–5.28)
RDW: 12.3 % (ref 11.7–15.4)
WBC: 9.2 10*3/uL (ref 3.4–10.8)

## 2022-09-15 LAB — PROTEIN / CREATININE RATIO, URINE
Creatinine, Urine: 122.5 mg/dL
Protein, Ur: 20 mg/dL
Protein/Creat Ratio: 163 mg/g creat (ref 0–200)

## 2022-09-21 ENCOUNTER — Other Ambulatory Visit: Payer: Medicaid Other

## 2022-09-21 ENCOUNTER — Ambulatory Visit (INDEPENDENT_AMBULATORY_CARE_PROVIDER_SITE_OTHER): Payer: Medicaid Other | Admitting: Obstetrics and Gynecology

## 2022-09-21 VITALS — BP 129/79 | HR 97 | Wt 303.0 lb

## 2022-09-21 DIAGNOSIS — Z3A33 33 weeks gestation of pregnancy: Secondary | ICD-10-CM | POA: Diagnosis not present

## 2022-09-21 DIAGNOSIS — O0993 Supervision of high risk pregnancy, unspecified, third trimester: Secondary | ICD-10-CM

## 2022-09-21 DIAGNOSIS — O163 Unspecified maternal hypertension, third trimester: Secondary | ICD-10-CM

## 2022-09-21 DIAGNOSIS — O0990 Supervision of high risk pregnancy, unspecified, unspecified trimester: Secondary | ICD-10-CM

## 2022-09-21 DIAGNOSIS — O09299 Supervision of pregnancy with other poor reproductive or obstetric history, unspecified trimester: Secondary | ICD-10-CM

## 2022-09-21 NOTE — Progress Notes (Signed)
Pt reports occasional decreased FM. She was aware of good FM during NST today

## 2022-09-27 ENCOUNTER — Ambulatory Visit: Payer: Medicaid Other

## 2022-09-27 ENCOUNTER — Other Ambulatory Visit: Payer: Medicaid Other

## 2022-09-27 ENCOUNTER — Other Ambulatory Visit: Payer: Self-pay

## 2022-09-27 ENCOUNTER — Ambulatory Visit: Payer: Medicaid Other | Admitting: Family Medicine

## 2022-09-27 VITALS — BP 133/80 | HR 98 | Wt 305.8 lb

## 2022-09-27 DIAGNOSIS — Z98891 History of uterine scar from previous surgery: Secondary | ICD-10-CM

## 2022-09-27 DIAGNOSIS — O0990 Supervision of high risk pregnancy, unspecified, unspecified trimester: Secondary | ICD-10-CM

## 2022-09-27 DIAGNOSIS — O09293 Supervision of pregnancy with other poor reproductive or obstetric history, third trimester: Secondary | ICD-10-CM

## 2022-09-27 DIAGNOSIS — Z8619 Personal history of other infectious and parasitic diseases: Secondary | ICD-10-CM

## 2022-09-27 DIAGNOSIS — Z3A34 34 weeks gestation of pregnancy: Secondary | ICD-10-CM

## 2022-09-27 DIAGNOSIS — O0993 Supervision of high risk pregnancy, unspecified, third trimester: Secondary | ICD-10-CM

## 2022-09-27 DIAGNOSIS — R7303 Prediabetes: Secondary | ICD-10-CM

## 2022-09-27 DIAGNOSIS — Z8632 Personal history of gestational diabetes: Secondary | ICD-10-CM

## 2022-09-27 DIAGNOSIS — O99213 Obesity complicating pregnancy, third trimester: Secondary | ICD-10-CM

## 2022-09-27 DIAGNOSIS — O09299 Supervision of pregnancy with other poor reproductive or obstetric history, unspecified trimester: Secondary | ICD-10-CM

## 2022-09-27 DIAGNOSIS — L299 Pruritus, unspecified: Secondary | ICD-10-CM

## 2022-09-27 MED ORDER — VALACYCLOVIR HCL 500 MG PO TABS
500.0000 mg | ORAL_TABLET | Freq: Two times a day (BID) | ORAL | 2 refills | Status: DC
Start: 2022-09-27 — End: 2022-11-07

## 2022-09-27 NOTE — Progress Notes (Signed)
PRENATAL VISIT NOTE  Subjective:  Angela Flynn is a 27 y.o. G3P1011 at [redacted]w[redacted]d being seen today for ongoing prenatal care.  She is currently monitored for the following issues for this high-risk pregnancy and has History of gestational diabetes in prior pregnancy, currently pregnant; Hx of preeclampsia, prior pregnancy, currently pregnant; Obesity affecting pregnancy; Supervision of high risk pregnancy, antepartum; History of C-section; Echogenic intracardiac focus of fetus on prenatal ultrasound; Trichomonas infection; and Prediabetes on their problem list.  Patient reports  itching in hands, arms, legs-present for 2 months, keeps her up at night .  Contractions: Irritability. Vag. Bleeding: None.  Movement: Present. Denies leaking of fluid.   The following portions of the patient's history were reviewed and updated as appropriate: allergies, current medications, past family history, past medical history, past social history, past surgical history and problem list.   Objective:   Vitals:   09/27/22 0952  BP: 133/80  Pulse: 98  Weight: (!) 305 lb 12.8 oz (138.7 kg)    Fetal Status: Fetal Heart Rate (bpm): 132   Movement: Present     General:  Alert, oriented and cooperative. Patient is in no acute distress.  Skin: Skin is warm and dry. No rash noted.   Cardiovascular: Normal heart rate noted  Respiratory: Normal respiratory effort, no problems with respiration noted  Abdomen: Soft, gravid, appropriate for gestational age.  Pain/Pressure: Present     Pelvic: Cervical exam deferred        Extremities: Normal range of motion.     Mental Status: Normal mood and affect. Normal behavior. Normal judgment and thought content.   Assessment and Plan:  Pregnancy: G3P1011 at [redacted]w[redacted]d  1. History of C-section Confirmed her desire for TOLAC--signed on 08/12/22  2. History of gestational diabetes in prior pregnancy, currently pregnant Never completed 2hr GTT-- Doing this today HA1c in early  pregnancy was prediabetes range Growth [place] on 5/29 showed AC 32nd% and EFW 62nd% which makes poorly controlled GDM unlikely  3. Hx of preeclampsia, prior pregnancy, currently pregnant BP is WNL today  4. Obesity affecting pregnancy in third trimester, unspecified obesity type TWG=25 lb 12.8 oz (11.7 kg) which is slightly above goal  5. Supervision of high risk pregnancy, antepartum Up to date, completing gtt today  Patient with extensive birth trauma that she discussed today. She was induced and reports emergent CS with meconium and then wound infection. She wants to avoid CS and induction.  She is having itching today that is concerning for ICP and we discussed possiblity of IOL at 37 weeks and hope for a different experience. We reviewed the reasons for induction and risk of stillbirth with ICP  but again she does not have this. Patient with anxiety about birth process.   6. HSV  - PPX today  Preterm labor symptoms and general obstetric precautions including but not limited to vaginal bleeding, contractions, leaking of fluid and fetal movement were reviewed in detail with the patient. Please refer to After Visit Summary for other counseling recommendations.   Return in about 2 weeks (around 10/11/2022) for Routine prenatal care.  Future Appointments  Date Time Provider Department Center  09/30/2022  3:30 PM [facility]3 [facility]  10/06/2022  4:15 PM Sue Lush, FNP [facility]  10/07/2022  3:15 PM [facility] NST [facility]  10/14/2022  3:15 PM [facility] NST [facility]  10/21/2022  3:15 PM [facility] NST [facility]  10/28/2022  3:45 PM [facility] US5 [facility]  11/04/2022  3:15 PM [facility] NST [facility]  11/11/2022  3:15 PM [facility]ST [facility]    Federico Flake, MD

## 2022-09-28 ENCOUNTER — Other Ambulatory Visit: Payer: Medicaid Other

## 2022-09-28 LAB — HEPATIC FUNCTION PANEL
ALT: 4 IU/L (ref 0–32)
AST: 8 IU/L (ref 0–40)
Albumin: 3.7 g/dL — ABNORMAL LOW (ref 4.0–5.0)
Alkaline Phosphatase: 121 IU/L (ref 44–121)
Bilirubin Total: 0.2 mg/dL (ref 0.0–1.2)
Bilirubin, Direct: <0.1 mg/dL (ref 0.00–0.40)
Total Protein: 6.1 g/dL (ref 6.0–8.5)

## 2022-09-28 LAB — GLUCOSE TOLERANCE, 2 HOURS W/ 1HR
Glucose, 1 hour: 143 mg/dL (ref 70–179)
Glucose, 2 hour: 157 mg/dL — ABNORMAL HIGH (ref 70–152)
Glucose, Fasting: 99 mg/dL — ABNORMAL HIGH (ref 70–91)

## 2022-09-28 LAB — BILE ACIDS, TOTAL: Bile Acids Total: 3.7 umol/L (ref 0.0–10.0)

## 2022-09-30 ENCOUNTER — Ambulatory Visit: Payer: Medicaid Other | Attending: Maternal & Fetal Medicine

## 2022-09-30 ENCOUNTER — Other Ambulatory Visit: Payer: Self-pay | Admitting: Family Medicine

## 2022-09-30 DIAGNOSIS — O24410 Gestational diabetes mellitus in pregnancy, diet controlled: Secondary | ICD-10-CM | POA: Insufficient documentation

## 2022-09-30 DIAGNOSIS — O99323 Drug use complicating pregnancy, third trimester: Secondary | ICD-10-CM | POA: Diagnosis not present

## 2022-09-30 DIAGNOSIS — Z3A34 34 weeks gestation of pregnancy: Secondary | ICD-10-CM

## 2022-09-30 DIAGNOSIS — Z362 Encounter for other antenatal screening follow-up: Secondary | ICD-10-CM | POA: Diagnosis present

## 2022-09-30 DIAGNOSIS — F1290 Cannabis use, unspecified, uncomplicated: Secondary | ICD-10-CM

## 2022-09-30 DIAGNOSIS — O09293 Supervision of pregnancy with other poor reproductive or obstetric history, third trimester: Secondary | ICD-10-CM

## 2022-09-30 DIAGNOSIS — Z8632 Personal history of gestational diabetes: Secondary | ICD-10-CM | POA: Insufficient documentation

## 2022-09-30 DIAGNOSIS — O99212 Obesity complicating pregnancy, second trimester: Secondary | ICD-10-CM | POA: Diagnosis not present

## 2022-09-30 DIAGNOSIS — E669 Obesity, unspecified: Secondary | ICD-10-CM

## 2022-09-30 DIAGNOSIS — O09299 Supervision of pregnancy with other poor reproductive or obstetric history, unspecified trimester: Secondary | ICD-10-CM | POA: Diagnosis present

## 2022-09-30 DIAGNOSIS — O24419 Gestational diabetes mellitus in pregnancy, unspecified control: Secondary | ICD-10-CM | POA: Insufficient documentation

## 2022-09-30 DIAGNOSIS — O34219 Maternal care for unspecified type scar from previous cesarean delivery: Secondary | ICD-10-CM

## 2022-09-30 DIAGNOSIS — O99213 Obesity complicating pregnancy, third trimester: Secondary | ICD-10-CM

## 2022-09-30 MED ORDER — BLOOD GLUCOSE MONITORING SUPPL DEVI
1.0000 | Freq: Three times a day (TID) | 0 refills | Status: DC
Start: 2022-09-30 — End: 2022-11-07

## 2022-09-30 MED ORDER — LANCETS MISC. MISC
1.0000 | Freq: Three times a day (TID) | 0 refills | Status: AC
Start: 2022-09-30 — End: 2022-10-30

## 2022-09-30 MED ORDER — BLOOD GLUCOSE TEST VI STRP
1.0000 | ORAL_STRIP | Freq: Three times a day (TID) | 0 refills | Status: AC
Start: 2022-09-30 — End: 2022-10-30

## 2022-09-30 MED ORDER — LANCET DEVICE MISC
1.0000 | Freq: Three times a day (TID) | 0 refills | Status: AC
Start: 2022-09-30 — End: 2022-10-30

## 2022-10-04 ENCOUNTER — Encounter: Payer: Self-pay | Admitting: Family Medicine

## 2022-10-06 ENCOUNTER — Ambulatory Visit (INDEPENDENT_AMBULATORY_CARE_PROVIDER_SITE_OTHER): Payer: Medicaid Other | Admitting: Obstetrics and Gynecology

## 2022-10-06 ENCOUNTER — Other Ambulatory Visit: Payer: Self-pay

## 2022-10-06 ENCOUNTER — Encounter: Payer: Self-pay | Admitting: Obstetrics and Gynecology

## 2022-10-06 VITALS — BP 121/79 | HR 101 | Wt 311.0 lb

## 2022-10-06 DIAGNOSIS — L299 Pruritus, unspecified: Secondary | ICD-10-CM

## 2022-10-06 DIAGNOSIS — O09299 Supervision of pregnancy with other poor reproductive or obstetric history, unspecified trimester: Secondary | ICD-10-CM

## 2022-10-06 DIAGNOSIS — Z98891 History of uterine scar from previous surgery: Secondary | ICD-10-CM

## 2022-10-06 DIAGNOSIS — O0990 Supervision of high risk pregnancy, unspecified, unspecified trimester: Secondary | ICD-10-CM

## 2022-10-06 DIAGNOSIS — O09293 Supervision of pregnancy with other poor reproductive or obstetric history, third trimester: Secondary | ICD-10-CM

## 2022-10-06 DIAGNOSIS — Z3A35 35 weeks gestation of pregnancy: Secondary | ICD-10-CM

## 2022-10-06 DIAGNOSIS — O0993 Supervision of high risk pregnancy, unspecified, third trimester: Secondary | ICD-10-CM

## 2022-10-06 DIAGNOSIS — Z8619 Personal history of other infectious and parasitic diseases: Secondary | ICD-10-CM

## 2022-10-06 DIAGNOSIS — O24410 Gestational diabetes mellitus in pregnancy, diet controlled: Secondary | ICD-10-CM

## 2022-10-06 NOTE — Progress Notes (Signed)
PRENATAL VISIT NOTE  Subjective:  Angela Flynn is a 27 y.o. G3P1011 at [redacted]w[redacted]d being seen today for ongoing prenatal care.  She is currently monitored for the following issues for this high-risk pregnancy and has History of gestational diabetes in prior pregnancy, currently pregnant; Hx of preeclampsia, prior pregnancy, currently pregnant; Obesity affecting pregnancy; Supervision of high risk pregnancy, antepartum; History of C-section; Echogenic intracardiac focus of fetus on prenatal ultrasound; Trichomonas infection; Prediabetes; and Gestational diabetes on their problem list.  Patient reports  Contractions: Not present. Vag. Bleeding: None.  Movement: Present. Denies leaking of fluid.   The following portions of the patient's history were reviewed and updated as appropriate: allergies, current medications, past family history, past medical history, past social history, past surgical history and problem list.   Objective:   Vitals:   10/06/22 1629  BP: 121/79  Pulse: (!) 101  Weight: (!) 311 lb (141.1 kg)    Fetal Status: Fetal Heart Rate (bpm): 128   Movement: Present     General:  Alert, oriented and cooperative. Patient is in no acute distress.  Skin: Skin is warm and dry. No rash noted.   Cardiovascular: Normal heart rate noted  Respiratory: Normal respiratory effort, no problems with respiration noted  Abdomen: Soft, gravid, appropriate for gestational age.  Pain/Pressure: Absent     Pelvic: Cervical exam deferred        Extremities: Normal range of motion.     Mental Status: Normal mood and affect. Normal behavior. Normal judgment and thought content.   Assessment and Plan:  Pregnancy: G3P1011 at [redacted]w[redacted]d 1. Supervision of high risk pregnancy, antepartum BP and FHR normal Feeling regular fetal movement Having a "popping sound like a bone breaking" occasionally in abdomen. Reports this is not like when her bone pops or fingers pop but cannot decipher what it is and why  its happening. Discussed reassuring u/s, FHR, denies other s&s. Continue to monitor and precautions discussed when to follow up   2. History of C-section Reports emergency c/s 2020 for FHR and reported her incision reopened Desires TOLAC, consent signed 08/12/22  3. Diet controlled gestational diabetes mellitus (GDM) in third trimester Has not been checking sugars, supplies sent to wrong pharmacy. Picking up supplies today, encourage to start checking and bring to appt next week.  Discussed IOL around 39 weeks per MFM 6/21 u/s EFW 78%, AC 94% AFI nml, BPP 8/8 Continue antenatal testing and growth u/s   4. Hx of preeclampsia, prior pregnancy, currently pregnant Normotensive today, continue ASA  5. History of herpes genitalis Continue valtrex  6. Itching Reported previously that she is having itching in her hand, legs, feet mostly at night. First reported its mainly itching where her incision is. Previous visit they discussed concern for ICP, possiblity of IOL at 37 weeks .They reviewed the reasons for induction and risk of stillbirth with ICP.  Bile acids and hepatic fx panel normal   Preterm labor symptoms and general obstetric precautions including but not limited to vaginal bleeding, contractions, leaking of fluid and fetal movement were reviewed in detail with the patient. Please refer to After Visit Summary for other counseling recommendations.   Return in one week for routine prenatal visit  Future Appointments  Date Time Provider Department Center  10/07/2022  3:15 PM [facility] NST [facility]  10/14/2022  3:15 PM [facility]ST [facility]  10/21/2022  3:15 PM [facility]ST [facility]  10/28/2022  3:45 PM [facility] US5 [facility]  11/04/2022  3:15 PM [facility]ST [facility]  11/11/2022  3:15 PM [facility] NST [facility]    Albertine Grates, FNP

## 2022-10-06 NOTE — Patient Instructions (Signed)

## 2022-10-07 ENCOUNTER — Ambulatory Visit: Payer: Medicaid Other | Attending: Maternal & Fetal Medicine

## 2022-10-10 NOTE — Progress Notes (Signed)
Patient was assessed and managed by nursing staff during this encounter. I have reviewed the chart and agree with the documentation and plan. I have also made any necessary editorial changes.  Warden Fillers, MD 10/10/2022 11:10 AM

## 2022-10-12 ENCOUNTER — Ambulatory Visit (INDEPENDENT_AMBULATORY_CARE_PROVIDER_SITE_OTHER): Payer: Medicaid Other | Admitting: Obstetrics and Gynecology

## 2022-10-12 VITALS — BP 110/77 | HR 119 | Wt 311.1 lb

## 2022-10-12 DIAGNOSIS — O368130 Decreased fetal movements, third trimester, not applicable or unspecified: Secondary | ICD-10-CM

## 2022-10-12 DIAGNOSIS — O09293 Supervision of pregnancy with other poor reproductive or obstetric history, third trimester: Secondary | ICD-10-CM

## 2022-10-12 DIAGNOSIS — R8271 Bacteriuria: Secondary | ICD-10-CM

## 2022-10-12 DIAGNOSIS — O0990 Supervision of high risk pregnancy, unspecified, unspecified trimester: Secondary | ICD-10-CM

## 2022-10-12 DIAGNOSIS — O24410 Gestational diabetes mellitus in pregnancy, diet controlled: Secondary | ICD-10-CM

## 2022-10-12 DIAGNOSIS — Z3A36 36 weeks gestation of pregnancy: Secondary | ICD-10-CM

## 2022-10-12 DIAGNOSIS — O0993 Supervision of high risk pregnancy, unspecified, third trimester: Secondary | ICD-10-CM | POA: Diagnosis not present

## 2022-10-12 DIAGNOSIS — Z98891 History of uterine scar from previous surgery: Secondary | ICD-10-CM

## 2022-10-12 DIAGNOSIS — O09299 Supervision of pregnancy with other poor reproductive or obstetric history, unspecified trimester: Secondary | ICD-10-CM

## 2022-10-12 NOTE — Progress Notes (Signed)
   PRENATAL VISIT NOTE  Subjective:  Angela Flynn is a 27 y.o. G3P1011 at [redacted]w[redacted]d being seen today for ongoing prenatal care.  She is currently monitored for the following issues for this low-risk pregnancy and has History of gestational diabetes in prior pregnancy, currently pregnant; Hx of preeclampsia, prior pregnancy, currently pregnant; Obesity affecting pregnancy; Supervision of high risk pregnancy, antepartum; History of C-section; Echogenic intracardiac focus of fetus on prenatal ultrasound; Trichomonas infection; Prediabetes; and Gestational diabetes on their problem list.  Patient reports heartburn and decreased movement .  Contractions: Not present. Vag. Bleeding: None.  Movement: (!) Decreased. Denies leaking of fluid.   Has not been checking sugars at home. Had GDM in prior pregnancy and states that she could feel when her sugars were off and hasn't felt that way in this pregnancy. Feeling warmth - last ate this morning, states has been eating less due to the GDM  Having issues w/ communication and MyChart. Not getting appt notifications and no phone call regarding upcoming appointments   The following portions of the patient's history were reviewed and updated as appropriate: allergies, current medications, past family history, past medical history, past social history, past surgical history and problem list.   Objective:   Vitals:   10/12/22 1424  BP: 110/77  Pulse: (!) 119  Weight: (!) 311 lb 1.6 oz (141.1 kg)    Fetal Status:     Movement: (!) Decreased     General:  Alert, oriented and cooperative. Patient is in no acute distress.  Skin: Skin is warm and dry. No rash noted.   Cardiovascular: Normal heart rate noted  Respiratory: Normal respiratory effort, no problems with respiration noted  Abdomen: Soft, gravid, appropriate for gestational age.  Pain/Pressure: Present     Pelvic: Cervical exam deferred        Extremities: Normal range of motion.  Edema: Trace   Mental Status: Normal mood and affect. Normal behavior. Normal judgment and thought content.   Assessment and Plan:  Pregnancy: G3P1011 at [redacted]w[redacted]d 1. [redacted] weeks gestation of pregnancy Reported decreased FM > NST reactive  2. Supervision of high risk pregnancy, antepartum Self swabbed - Culture, beta strep (group b only)  3. Diet controlled gestational diabetes mellitus (GDM) in third trimester Has not been checking sugars - advised to at least check morning fasting levels to get some data. Will also follow up US and MFM recommendations Recommend IOL at least at 39 weeks but may be indicated sooner if sugars poorly controlled or changes in fetal status   4. Hx of preeclampsia, prior pregnancy, currently pregnant Normotensive today   5. History of C-section Would like to TOLAC. Reports prior c/s indicated for fetal intolerance to labor   6. GBS bacteriuria Will need treatment in labor   Preterm labor symptoms and general obstetric precautions including but not limited to vaginal bleeding, contractions, leaking of fluid and fetal movement were reviewed in detail with the patient. Please refer to After Visit Summary for other counseling recommendations.   No follow-ups on file.  Future Appointments  Date Time Provider Department Center  10/14/2022  3:15 PM [facility] NST [facility]  10/21/2022  3:15 PM [facility]  10/28/2022  3:45 PM [facility]  11/04/2022  3:15 PM [facility]ST [facility]  11/11/2022  3:15 PM [facility]ST [facility]    Lorriane Shire, MD

## 2022-10-12 NOTE — Progress Notes (Signed)
Nst reviewed with Dr. Briscoe Deutscher prior to patient leaving.  Reviewed when to report to hospital with patient and kick counts. Nancy Fetter

## 2022-10-12 NOTE — Patient Instructions (Signed)
Plan for July 25 for your induction assuming all testing normal

## 2022-10-14 ENCOUNTER — Ambulatory Visit: Payer: Medicaid Other

## 2022-10-16 ENCOUNTER — Encounter: Payer: Self-pay | Admitting: Obstetrics and Gynecology

## 2022-10-19 ENCOUNTER — Encounter (HOSPITAL_COMMUNITY): Payer: Self-pay | Admitting: Obstetrics & Gynecology

## 2022-10-19 ENCOUNTER — Other Ambulatory Visit: Payer: Self-pay

## 2022-10-19 ENCOUNTER — Encounter: Payer: Self-pay | Admitting: General Practice

## 2022-10-19 ENCOUNTER — Ambulatory Visit (INDEPENDENT_AMBULATORY_CARE_PROVIDER_SITE_OTHER): Payer: Medicaid Other | Admitting: Obstetrics & Gynecology

## 2022-10-19 ENCOUNTER — Inpatient Hospital Stay (HOSPITAL_COMMUNITY)
Admission: AD | Admit: 2022-10-19 | Discharge: 2022-10-19 | Disposition: A | Discharge status: Home / Self Care | Payer: Medicaid Other | Attending: Obstetrics & Gynecology | Admitting: Obstetrics & Gynecology

## 2022-10-19 ENCOUNTER — Encounter: Payer: Self-pay | Admitting: Family Medicine

## 2022-10-19 VITALS — BP 136/83 | HR 102 | Wt 314.8 lb

## 2022-10-19 DIAGNOSIS — O368130 Decreased fetal movements, third trimester, not applicable or unspecified: Secondary | ICD-10-CM

## 2022-10-19 DIAGNOSIS — N760 Acute vaginitis: Secondary | ICD-10-CM | POA: Diagnosis not present

## 2022-10-19 DIAGNOSIS — O26893 Other specified pregnancy related conditions, third trimester: Secondary | ICD-10-CM | POA: Insufficient documentation

## 2022-10-19 DIAGNOSIS — Z3A37 37 weeks gestation of pregnancy: Secondary | ICD-10-CM | POA: Diagnosis not present

## 2022-10-19 DIAGNOSIS — Z98891 History of uterine scar from previous surgery: Secondary | ICD-10-CM

## 2022-10-19 DIAGNOSIS — O24410 Gestational diabetes mellitus in pregnancy, diet controlled: Secondary | ICD-10-CM

## 2022-10-19 DIAGNOSIS — R030 Elevated blood-pressure reading, without diagnosis of hypertension: Secondary | ICD-10-CM | POA: Insufficient documentation

## 2022-10-19 DIAGNOSIS — O99210 Obesity complicating pregnancy, unspecified trimester: Secondary | ICD-10-CM

## 2022-10-19 DIAGNOSIS — O09299 Supervision of pregnancy with other poor reproductive or obstetric history, unspecified trimester: Secondary | ICD-10-CM

## 2022-10-19 DIAGNOSIS — O0990 Supervision of high risk pregnancy, unspecified, unspecified trimester: Secondary | ICD-10-CM

## 2022-10-19 DIAGNOSIS — B9689 Other specified bacterial agents as the cause of diseases classified elsewhere: Secondary | ICD-10-CM | POA: Diagnosis not present

## 2022-10-19 DIAGNOSIS — Z0371 Encounter for suspected problem with amniotic cavity and membrane ruled out: Secondary | ICD-10-CM

## 2022-10-19 LAB — URINALYSIS, ROUTINE W REFLEX MICROSCOPIC
Bilirubin Urine: NEGATIVE
Glucose, UA: 50 mg/dL — AB
Hgb urine dipstick: NEGATIVE
Ketones, ur: NEGATIVE mg/dL
Nitrite: NEGATIVE
Protein, ur: NEGATIVE mg/dL
Specific Gravity, Urine: 1.021 (ref 1.005–1.030)
pH: 5 (ref 5.0–8.0)

## 2022-10-19 LAB — WET PREP, GENITAL
Sperm: NONE SEEN
Trich, Wet Prep: NONE SEEN
WBC, Wet Prep HPF POC: >=10 — AB (ref <10)
Yeast Wet Prep HPF POC: NONE SEEN

## 2022-10-19 LAB — PROTEIN / CREATININE RATIO, URINE
Creatinine, Urine: 147 mg/dL
Protein Creatinine Ratio: 0.09 mg/mg{Cre} (ref 0.00–0.15)
Total Protein, Urine: 13 mg/dL

## 2022-10-19 LAB — COMPREHENSIVE METABOLIC PANEL
ALT: 8 U/L (ref 0–44)
AST: 12 U/L — ABNORMAL LOW (ref 15–41)
Albumin: 2.6 g/dL — ABNORMAL LOW (ref 3.5–5.0)
Alkaline Phosphatase: 128 U/L — ABNORMAL HIGH (ref 38–126)
Anion gap: 10 (ref 5–15)
BUN: 5 mg/dL — ABNORMAL LOW (ref 6–20)
CO2: 20 mmol/L — ABNORMAL LOW (ref 22–32)
Calcium: 8.9 mg/dL (ref 8.9–10.3)
Chloride: 104 mmol/L (ref 98–111)
Creatinine, Ser: 0.48 mg/dL (ref 0.44–1.00)
GFR, Estimated: >60 mL/min (ref >60)
Glucose, Bld: 82 mg/dL (ref 70–99)
Potassium: 3.8 mmol/L (ref 3.5–5.1)
Sodium: 134 mmol/L — ABNORMAL LOW (ref 135–145)
Total Bilirubin: 0.3 mg/dL (ref 0.3–1.2)
Total Protein: 6.6 g/dL (ref 6.5–8.1)

## 2022-10-19 LAB — CBC
HCT: 34.6 % — ABNORMAL LOW (ref 36.0–46.0)
Hemoglobin: 11.4 g/dL — ABNORMAL LOW (ref 12.0–15.0)
MCH: 27.9 pg (ref 26.0–34.0)
MCHC: 32.9 g/dL (ref 30.0–36.0)
MCV: 84.6 fL (ref 80.0–100.0)
Platelets: 308 10*3/uL (ref 150–400)
RBC: 4.09 MIL/uL (ref 3.87–5.11)
RDW: 13.4 % (ref 11.5–15.5)
WBC: 10.6 10*3/uL — ABNORMAL HIGH (ref 4.0–10.5)
nRBC: 0 % (ref 0.0–0.2)

## 2022-10-19 LAB — POCT FERN TEST: POCT Fern Test: NEGATIVE

## 2022-10-19 MED ORDER — METRONIDAZOLE 500 MG PO TABS: 500.0000 mg | ORAL_TABLET | Freq: Two times a day (BID) | ORAL | 0 refills | Status: DC

## 2022-10-19 NOTE — Progress Notes (Signed)
PRENATAL VISIT NOTE  Subjective:  Angela Flynn is a 27 y.o. G3P1011 at [redacted]w[redacted]d being seen today for ongoing prenatal care.  She is currently monitored for the following issues for this high-risk pregnancy and has Hx of preeclampsia, prior pregnancy, currently pregnant; Obesity affecting pregnancy; Supervision of high risk pregnancy, antepartum; History of C-section; Echogenic intracardiac focus of fetus on prenatal ultrasound; Trichomonas infection; Prediabetes; Gestational diabetes; and GBS bacteriuria on their problem list.  Patient reports  leaking of fluid that happened over this past weekend, and reports that she continues to leak especially when she goes to bathroom.  Also reports diffuse abdominal tenderness, worsened over the last few days.  Reports decreased fetal movement, also had this last visit and had a reactive NST . She is very concerned about this.   Contractions: Irritability. Vag. Bleeding: None.  Movement: (!) Decreased. Patient denies any headaches, visual symptoms, RUQ/epigastric pain or other concerning symptoms.  The following portions of the patient's history were reviewed and updated as appropriate: allergies, current medications, past family history, past medical history, past social history, past surgical history and problem list.   Objective:   Vitals:   10/19/22 1453 10/19/22 1520  BP: (!) 140/84 136/83  Pulse: 100 (!) 102  Weight: (!) 314 lb 12.8 oz (142.8 kg)     Fetal Status: Fetal Heart Rate (bpm): 145 (On bedside)   Movement: (!) Decreased     General:  Alert, oriented and cooperative. Patient is in no acute distress.  Skin: Skin is warm and dry. No rash noted.   Cardiovascular: Normal heart rate noted  Respiratory: Normal respiratory effort, no problems with respiration noted  Abdomen: Soft, gravid, appropriate for gestational age.  Pain/Pressure: Present     Pelvic: Cervical exam deferred        Extremities: Normal range of motion.  Edema: Trace   Mental Status: Normal mood and affect. Normal behavior. Normal judgment and thought content.   [place] MFM FETAL BPP WO NON STRESS  Result Date: 09/30/2022 ----------------------------------------------------------------------  OBSTETRICS REPORT                       (Signed Final 09/30/2022 04:54 pm) ---------------------------------------------------------------------- Patient Info  ID #:       [id number]                          D.O.B.:  10-Aug-1995 (27 yrs)  Name:       Angela Flynn             Visit Date: 09/30/2022 03:41 pm ---------------------------------------------------------------------- Performed By  Attending:        Braxton Feathers DO       Ref. Address:     [address]                                                             [place], [place]  [address]  Performed By:     Tommie Raymond BS,       [facility]                    RD[facility], RVT                                [facility]  Referred By:      [facility] ---------------------------------------------------------------------- Orders  #  Description                           Code        Ordered By  1  [place] MFM FETAL BPP WO NON               76819.01    Angela Flynn     STRESS  2  [place] MFM OB FOLLOW UP                   76816.01    [facility] ----------------------------------------------------------------------  #  Order #                     Accession #                Episode #  1  [id number]                   [id number]                 [id number]  2  [id number]                   [id number]                 [id number] ---------------------------------------------------------------------- Indications  [redacted] weeks gestation of pregnancy                Z3A.34  Poor obstetric history: Previous preeclampsia   O09.299  Obesity complicating pregnancy, third          O99.213  trimester (BMI 41)  History of cesarean delivery, currently        O34.219  pregnant  Cannabinoid exposure in pregnancy              O99.323  (05/02/22)  Antenatal follow-up for nonvisualized fetal    Z36.2  anatomy  Gestational diabetes in pregnancy, diet        O24.410  controlled ---------------------------------------------------------------------- Fetal Evaluation  Num Of Fetuses:  1  Fetal Heart Rate(bpm):  137  Cardiac Activity:       Observed  Presentation:           Cephalic  Placenta:               Posterior  P. Cord Insertion:      Previously visualized  Amniotic Fluid  AFI FV:      Within normal limits  AFI Sum(cm)     %Tile       Largest Pocket(cm)  14.92           54          5.34  RUQ(cm)       RLQ(cm)       LUQ(cm)        LLQ(cm)  5.34          4.07          2.44           3.07 ---------------------------------------------------------------------- Biophysical Evaluation  Amniotic F.V:   Pocket => 2 cm             F. Tone:        Observed  F. Movement:    Observed                   Score:          8/8  F. Breathing:   Observed ---------------------------------------------------------------------- Biometry  BPD:      85.3  mm     G. Age:  34w 3d         37  %    CI:        73.83   %    70 - 86                                                          FL/HC:      21.9   %    20.1 - 22.3  HC:      315.3  mm     G. Age:  35w 3d         28  %    HC/AC:      0.96        0.93 - 1.11  AC:      327.7  mm     G. Age:  36w 5d         94  %    FL/BPD:     81.1   %    71 - 87  FL:       69.2  mm     G. Age:  35w 4d         60  %    FL/AC:      21.1   %    20 - 24  LV:        3.1  mm  Est. FW:    2819  gm      6 lb 3 oz     78  % ---------------------------------------------------------------------- OB History  Gravidity:    3         Term:   1         SAB:   1  Living:       1  ---------------------------------------------------------------------- Gestational  Age  LMP:           33w 1d        Date:  02/10/22                  EDD:   11/17/22  U/S Today:     35w 4d                                        EDD:   10/31/22  Best:          34w 6d     Det. By:  U/S  (06/27/22)          EDD:   11/05/22 ---------------------------------------------------------------------- Anatomy  Cranium:               Appears normal         LVOT:                   Not well visualized  Cavum:                 Appears normal         Aortic Arch:            Not well visualized  Ventricles:            Appears normal         Ductal Arch:            Not well visualized  Choroid Plexus:        Previously seen        Diaphragm:              Appears normal  Cerebellum:            Previously seen        Stomach:                Appears normal, left                                                                        sided  Posterior Fossa:       Previously seen        Abdomen:                Previously seen  Nuchal Fold:           Not applicable (>20    Abdominal Wall:         Previously seen                         wks GA)  Face:                  Orbits and profile     Cord Vessels:           Previously seen                         previously seen  Lips:                  Previously seen  Kidneys:                Pelvic kidney  Palate:                Not well visualized    Bladder:                Appears normal  Thoracic:              Appears normal         Spine:                  Previously seen  Heart:                 Appears normal; EIF    Upper Extremities:      Previously seen  RVOT:                  Not well visualized    Lower Extremities:      Previously seen  Other:  Female gender, Nasal bone, lenses, falx, Heels/feet, hands/5th digits,          VC, and 3VV previously visualized. Technically difficult due to          maternal habitus and fetal position.  ---------------------------------------------------------------------- Cervix Uterus Adnexa  Cervix  Normal appearance by transabdominal scan  Uterus  No abnormality visualized.  Right Ovary  Within normal limits.  Left Ovary  Within normal limits.  Cul De Sac  No free fluid seen.  Adnexa  No abnormality visualized ---------------------------------------------------------------------- Comments  The patient is here for a follow-up BPP and growth ultrasound  at 34w 6d. EDD: 11/05/2022 dated by U/S  (06/27/22).  She  appears to have failed her GCT but was not told about it. She  will call monday to see about getting referred to diabetic  education.  Sonographic findings  Single intrauterine pregnancy.  Fetal cardiac activity: Observed.  Presentation: Cephalic.  Interval fetal anatomy appears normal except for what  appears to be a right sided pelvic kidney.  Fetal biometry shows the estimated fetal weight at the 78  percentile.  Amniotic fluid volume: Within normal limits. AFI: 14.92 cm.  MVP: 5.34 cm.  Placenta: Posterior.  BPP: 8/8.  Recommendations  1. Weekly antenatal testing until delivery  2. Growth ultrasounds every 4-6 weeks until delivery  3. Delivery around [redacted] weeks gestation  4. Messaged her OB provider to have diabetes supplies sent  asap ----------------------------------------------------------------------                  Braxton Feathers, DO Electronically Signed Final Report   09/30/2022 04:54 pm ----------------------------------------------------------------------  [place] MFM OB FOLLOW UP  Result Date: 09/30/2022 ----------------------------------------------------------------------  OBSTETRICS REPORT                       (Signed Final 09/30/2022 04:54 pm) ---------------------------------------------------------------------- Patient Info  ID #:       [id number]                          D.O.B.:  1995-04-13 (27 yrs)  Name:       Angela Raring Marcoe             Visit Date: 09/30/2022 03:41 pm  ---------------------------------------------------------------------- Performed By  Attending:        Braxton Feathers DO       Ref. Address:     [address]  [place], [place]                                                             [address]  Performed By:     Tommie Raymond BS,       Location:         [facility] RVT                                [facility]  Referred By:      [facility] ---------------------------------------------------------------------- Orders  #  Description                           Code        Ordered By  1  [place] MFM FETAL BPP WO NON               76819.01    Angela Flynn     STRESS  2  [place] MFM OB FOLLOW UP                   [id number]    [facility] ----------------------------------------------------------------------  #  Order #                     Accession #                Episode #  1  [id number]                   [id number]                 [id number]  2  [id number]                   [id number]                 [id number] ---------------------------------------------------------------------- Indications  [redacted] weeks gestation of pregnancy                Z3A.34  Poor obstetric history: Previous preeclampsia  O09.299  Obesity complicating pregnancy, third          O99.213  trimester (BMI 41)  History of cesarean delivery, currently        O34.219  pregnant  Cannabinoid exposure in pregnancy              O99.323  (05/02/22)  Antenatal follow-up for nonvisualized fetal    Z36.2  anatomy  Gestational diabetes in pregnancy, diet        O24.410  controlled ---------------------------------------------------------------------- Fetal Evaluation  Num Of Fetuses:         1  Fetal Heart Rate(bpm):  137  Cardiac Activity:       Observed   Presentation:           Cephalic  Placenta:               Posterior  P. Cord Insertion:      Previously visualized  Amniotic Fluid  AFI FV:      Within normal limits  AFI Sum(cm)     %Tile       Largest Pocket(cm)  14.92           54          5.34  RUQ(cm)       RLQ(cm)       LUQ(cm)        LLQ(cm)  5.34          4.07          2.44           3.07 ---------------------------------------------------------------------- Biophysical Evaluation  Amniotic F.V:   Pocket => 2 cm             F. Tone:        Observed  F. Movement:    Observed                   Score:          8/8  F. Breathing:   Observed ---------------------------------------------------------------------- Biometry  BPD:      85.3  mm     G. Age:  34w 3d         37  %    CI:        73.83   %    70 - 86                                                          FL/HC:      21.9   %    20.1 - 22.3  HC:      315.3  mm     G. Age:  35w 3d         28  %    HC/AC:      0.96        0.93 - 1.11  AC:      327.7  mm     G. Age:  36w 5d         94  %    FL/BPD:     81.1   %    71 - 87  FL:       69.2  mm     G. Age:  35w 4d         60  %    FL/AC:      21.1   %    20 - 24  LV:        3.1  mm  Est. FW:  2819  gm      6 lb 3 oz     78  % ---------------------------------------------------------------------- OB History  Gravidity:    3         Term:   1         SAB:   1  Living:       1 ---------------------------------------------------------------------- Gestational Age  LMP:           33w 1d        Date:  02/10/22                  EDD:   11/17/22  U/S Today:     35w 4d                                        EDD:   10/31/22  Best:          34w 6d     Det. By:  U/S  (06/27/22)          EDD:   11/05/22 ---------------------------------------------------------------------- Anatomy  Cranium:               Appears normal         LVOT:                   Not well visualized  Cavum:                 Appears normal         Aortic Arch:            Not well visualized  Ventricles:             Appears normal         Ductal Arch:            Not well visualized  Choroid Plexus:        Previously seen        Diaphragm:              Appears normal  Cerebellum:            Previously seen        Stomach:                Appears normal, left                                                                        sided  Posterior Fossa:       Previously seen        Abdomen:                Previously seen  Nuchal Fold:           Not applicable (>20    Abdominal Wall:         Previously seen                         wks GA)  Face:                  Orbits and profile     Cord Vessels:  Previously seen                         previously seen  Lips:                  Previously seen        Kidneys:                Pelvic kidney  Palate:                Not well visualized    Bladder:                Appears normal  Thoracic:              Appears normal         Spine:                  Previously seen  Heart:                 Appears normal; EIF    Upper Extremities:      Previously seen  RVOT:                  Not well visualized    Lower Extremities:      Previously seen  Other:  Female gender, Nasal bone, lenses, falx, Heels/feet, hands/5th digits,          VC, and 3VV previously visualized. Technically difficult due to          maternal habitus and fetal position. ---------------------------------------------------------------------- Cervix Uterus Adnexa  Cervix  Normal appearance by transabdominal scan  Uterus  No abnormality visualized.  Right Ovary  Within normal limits.  Left Ovary  Within normal limits.  Cul De Sac  No free fluid seen.  Adnexa  No abnormality visualized ---------------------------------------------------------------------- Comments  The patient is here for a follow-up BPP and growth ultrasound  at 34w 6d. EDD: 11/05/2022 dated by U/S  (06/27/22).  She  appears to have failed her GCT but was not told about it. She  will call monday to see about getting referred to diabetic  education.   Sonographic findings  Single intrauterine pregnancy.  Fetal cardiac activity: Observed.  Presentation: Cephalic.  Interval fetal anatomy appears normal except for what  appears to be a right sided pelvic kidney.  Fetal biometry shows the estimated fetal weight at the 78  percentile.  Amniotic fluid volume: Within normal limits. AFI: 14.92 cm.  MVP: 5.34 cm.  Placenta: Posterior.  BPP: 8/8.  Recommendations  1. Weekly antenatal testing until delivery  2. Growth ultrasounds every 4-6 weeks until delivery  3. Delivery around [redacted] weeks gestation  4. Messaged her OB provider to have diabetes supplies sent  asap ----------------------------------------------------------------------                  Braxton Feathers, DO Electronically Signed Final Report   09/30/2022 04:54 pm ----------------------------------------------------------------------   Assessment and Plan:  Pregnancy: G3P1011 at [redacted]w[redacted]d 1. Decreased fetal movements in third trimester, single or unspecified fetus Concerned about this, patient will be sent to MAU for NST and further evaluation.  2. Maternal morbid obesity, antepartum (HCC) TWG 37 lbs, Body mass index is 46.49 kg/m.  3. Diet controlled gestational diabetes mellitus (GDM) in third trimester Did not bring log, reports only checking fasting BS.  Unsure about control for her.  4. History of C-section Desires TOLAC, consent signed 08/09/22  5. Hx of preeclampsia,  prior pregnancy, currently pregnant Had initial elevated BP, no symptoms. Recheck normal. Labs are likely to be checked while in MAU. If she continues to have elevated BP, she may need to proceed with delivery.  6. [redacted] weeks gestation of pregnancy 7. Supervision of high risk pregnancy, antepartum Sent to MAU for evaluation of possible ROM, further BP monitoring and NST given decreased FM. MAU providers and staff informed. Term labor symptoms and general obstetric precautions including but not limited to vaginal bleeding,  contractions, leaking of fluid and fetal movement were reviewed in detail with the patient. Please refer to After Visit Summary for other counseling recommendations.   Return in about 1 week (around 10/26/2022) for OFFICE OB VISIT (MD only).  Future Appointments  Date Time Provider Department Center  10/21/2022  3:15 PM [facility] NST [facility]  10/28/2022  3:45 PM [facility] US5 [facility]  11/03/2022  6:30 AM [facility] SCHED ROOM [facility] None  11/04/2022  3:15 PM [facility] NST [facility]  11/11/2022  3:15 PM [facility] NST [facility]    Jaynie Collins, MD

## 2022-10-19 NOTE — MAU Note (Signed)
.  Angela Flynn is a 27 y.o. at [redacted]w[redacted]d here in MAU reporting: sent from office for leaking since this weekend. Reports generalized discomfort . Does not think she is contraction. Denies any vag  bleeding good fetal movement LMP:  Onset of complaint: Saturday Pain score: 0 Vitals:   10/19/22 1555  BP: 125/82  Pulse: (!) 108  Resp: 18  Temp: 97.9 F (36.6 C)     FHT:130 Lab orders placed from triage:  u/a

## 2022-10-19 NOTE — MAU Provider Note (Signed)
History     [id number]  Arrival date and time: 10/19/22 1535    Chief Complaint  Patient presents with   Rupture of Membranes   Hypertension     HPI Angela Flynn is a 27 y.o. at [redacted]w[redacted]d with PMHx notable for GBS bacteriuria, GDM, hx of prior cesarean, who presents for leaking fluid and decreased fetal movement.   Patient sent from office for above complaints Also had elevated BP in office that was normal on recheck Denies headache, vision changes, chest pain, SOB, RUQ pain Also reports intermittent leaking No large gush of fluid More constant sensation of wetness No vaginal bleeding or contractions While in MAU reports fetal movement has been vigorous and normal   O/Positive/-- (01/15 1443)  OB History     Gravida  3   Para  1   Term  1   Preterm      AB  1   Living  1      SAB  1   IAB  0   Ectopic  0   Multiple      Live Births  1           Past Medical History:  Diagnosis Date   Allergy    seasonal, dusts   Complication of anesthesia    Felt like she couldn't breathe with mask on after 1 procedure   DVT (deep vein thrombosis) in pregnancy    Gestational diabetes    Headache    2-3 per week   Herpes genitalis    MRSA infection    abscesses - history of   Pregnancy induced hypertension     Past Surgical History:  Procedure Laterality Date   CESAREAN SECTION     CHOLECYSTECTOMY, LAPAROSCOPIC     OPEN REDUCTION INTERNAL FIXATION (ORIF) PROXIMAL PHALANX Left 02/04/2022   Procedure: Open reduction and internal fixation of left fifth digit proximal phalanx fracture;  Surgeon: Kennedy Bucker, MD;  Location: [facility];  Service: Orthopedics;  Laterality: Left;   TONSILLECTOMY      Family History  Problem Relation Age of Onset   Bipolar disorder Mother    Heart disease Mother    Heart disease Sister    Hypertension Sister    Diabetes Maternal Grandmother    Hypertension Maternal Grandmother     Social History    Socioeconomic History   Marital status: Single    Spouse name: Not on file   Number of children: 1   Years of education: 12   Highest education level: Not on file  Occupational History   Occupation: unemployed  Tobacco Use   Smoking status: Never   Smokeless tobacco: Never   Tobacco comments:    Black and mild occasionally   Vaping Use   Vaping Use: Never used  Substance and Sexual Activity   Alcohol use: Not Currently   Drug use: Not Currently    Types: Marijuana    Comment: last use November 2023   Sexual activity: Not Currently    Partners: Male    Birth control/protection: None  Other Topics Concern   Not on file  Social History Narrative   Not on file   Social Determinants of Health   Financial Resource Strain: Low Risk  (04/01/2022)   Overall Financial Resource Strain (CARDIA)    Difficulty of Paying Living Expenses: Not hard at all  Food Insecurity: No Food Insecurity (05/25/2022)   Hunger Vital Sign    Worried About Running Out of  Food in the Last Year: Never true    Ran Out of Food in the Last Year: Never true  Transportation Needs: No Transportation Needs (05/25/2022)   PRAPARE - Administrator, Civil Service (Medical): No    Lack of Transportation (Non-Medical): No  Physical Activity: Inactive (04/01/2022)   Exercise Vital Sign    Days of Exercise per Week: 0 days    Minutes of Exercise per Session: 0 min  Stress: Stress Concern Present (04/01/2022)   [employer] of Occupational Health - Occupational Stress Questionnaire    Feeling of Stress : Rather much  Social Connections: Unknown (04/01/2022)   Social Connection and Isolation Panel [NHANES]    Frequency of Communication with Friends and Family: Patient declined    Frequency of Social Gatherings with Friends and Family: Never    Attends Religious Services: Never    Database administrator or Organizations: No    Attends Banker Meetings: Never    Marital Status: Not  on file  Intimate Partner Violence: Not At Risk (04/01/2022)   Humiliation, Afraid, Rape, and Kick questionnaire    Fear of Current or Ex-Partner: No    Emotionally Abused: No    Physically Abused: No    Sexually Abused: No    Allergies  Allergen Reactions   Tape     Blisters from tape used after c-section   Silicone Rash    Blisters from tape used after c-section    No current facility-administered medications on file prior to encounter.   Current Outpatient Medications on File Prior to Encounter  Medication Sig Dispense Refill   valACYclovir (VALTREX) 500 MG tablet Take 1 tablet (500 mg total) by mouth 2 (two) times daily. 60 tablet 2   aspirin EC 81 MG tablet Take 2 tablets (162 mg total) by mouth daily. Start taking when you are [redacted] weeks pregnant for rest of pregnancy for prevention of preeclampsia 60 tablet 5   Blood Glucose Monitoring Suppl DEVI 1 each by Does not apply route in the morning, at noon, and at bedtime. May substitute to any manufacturer covered by patient's insurance. (Patient not taking: Reported on 10/12/2022) 1 each 0   Glucose Blood (BLOOD GLUCOSE TEST STRIPS) STRP 1 each by In Vitro route in the morning, at noon, and at bedtime. May substitute to any manufacturer covered by patient's insurance. (Patient not taking: Reported on 10/12/2022) 100 strip 0   Lancet Device MISC 1 each by Does not apply route in the morning, at noon, and at bedtime. May substitute to any manufacturer covered by patient's insurance. (Patient not taking: Reported on 10/12/2022) 1 each 0   Lancets Misc. MISC 1 each by Does not apply route in the morning, at noon, and at bedtime. May substitute to any manufacturer covered by patient's insurance. (Patient not taking: Reported on 10/12/2022) 100 each 0     ROS Pertinent positives and negative per HPI, all others reviewed and negative  Physical Exam   BP (!) 129/54   Pulse (!) 104   Temp 97.9 F (36.6 C)   Resp 18   Ht 5\' 9"  (1.753 m)   Wt  (!) 143.8 kg   LMP 02/10/2022 (Approximate)   BMI 46.81 kg/m   Patient Vitals for the past 24 hrs:  BP Temp Pulse Resp Height Weight  10/19/22 1627 (!) 129/54 -- (!) 104 -- -- --  10/19/22 1555 125/82 97.9 F (36.6 C) (!) 108 18 5\' 9"  (1.753 m) Marland Kitchen)  143.8 kg    Physical Exam Vitals reviewed.  Constitutional:      General: She is not in acute distress.    Appearance: She is well-developed. She is not diaphoretic.  Eyes:     General: No scleral icterus. Pulmonary:     Effort: Pulmonary effort is normal. No respiratory distress.  Abdominal:     General: There is no distension.     Palpations: Abdomen is soft.     Tenderness: There is no abdominal tenderness. There is no guarding or rebound.  Genitourinary:    Comments: Scant white discharge. Neg pool, neg valsalva, neg fern Skin:    General: Skin is warm and dry.  Neurological:     Mental Status: She is alert.     Coordination: Coordination normal.      Cervical Exam    Bedside Ultrasound Not performed.  My interpretation: n/a  FHT Baseline: 125 bpm Variability: Good {> 6 bpm) Accelerations: Reactive Decelerations: Absent Uterine activity: irregular, rare Cat: I  Labs Results for orders placed or performed during the hospital encounter of 10/19/22 (from the past 24 hour(s))  Urinalysis, Routine w reflex microscopic -Urine, Clean Catch     Status: Abnormal   Collection Time: 10/19/22  4:09 PM  Result Value Ref Range   Color, Urine YELLOW YELLOW   APPearance HAZY (A) CLEAR   Specific Gravity, Urine 1.021 1.005 - 1.030   pH 5.0 5.0 - 8.0   Glucose, UA 50 (A) NEGATIVE mg/dL   Hgb urine dipstick NEGATIVE NEGATIVE   Bilirubin Urine NEGATIVE NEGATIVE   Ketones, ur NEGATIVE NEGATIVE mg/dL   Protein, ur NEGATIVE NEGATIVE mg/dL   Nitrite NEGATIVE NEGATIVE   Leukocytes,Ua TRACE (A) NEGATIVE   RBC / HPF 0-5 0 - 5 RBC/hpf   WBC, UA 0-5 0 - 5 WBC/hpf   Bacteria, UA RARE (A) NONE SEEN   Squamous Epithelial / HPF  21-50 0 - 5 /HPF   Mucus PRESENT    Ca Oxalate Crys, UA PRESENT   Wet prep, genital     Status: Abnormal   Collection Time: 10/19/22  4:57 PM   Specimen: Cervix  Result Value Ref Range   Yeast Wet Prep HPF POC NONE SEEN NONE SEEN   Trich, Wet Prep NONE SEEN NONE SEEN   Clue Cells Wet Prep HPF POC PRESENT (A) NONE SEEN   WBC, Wet Prep HPF POC >=10 (A) <10   Sperm NONE SEEN   Fern Test     Status: Normal   Collection Time: 10/19/22  4:57 PM  Result Value Ref Range   POCT Fern Test Negative = intact amniotic membranes     Imaging No results found.  MAU Course  Procedures Lab Orders         Wet prep, genital         Urinalysis, Routine w reflex microscopic -Urine, Clean Catch         CBC         Comprehensive metabolic panel         Protein / creatinine ratio, urine         Fern Test    Meds ordered this encounter  Medications   metroNIDAZOLE (FLAGYL) 500 MG tablet    Sig: Take 1 tablet (500 mg total) by mouth 2 (two) times daily.    Dispense:  14 tablet    Refill:  0   Imaging Orders  No imaging studies ordered today    MDM Moderate (Level 3-4)  Assessment and Plan  #Encounter for suspected rupture of membranes, with rupture of membranes not found #[redacted] weeks gestation of pregnancy #BV Ruled out for rupture with neg pool, neg valsalva, neg fern slide. Wet prep with BV, will send flagyl given significant discomfort on exam.   #Elevated blood pressure without diagnosis of hypertension Normotensive while in MAU. PreE labs sent, follow up on results as outpatient.   #Decreased fetal movement, resolved NST reactive, fetal movement normal while in MAU.  #FWB FHT Cat I NST: Reactive   Dispo: discharged to home in stable condition    Venora Maples, MD/MPH 10/19/22 5:58 PM  Allergies as of 10/19/2022       Reactions   Tape    Blisters from tape used after c-section   Silicone Rash   Blisters from tape used after c-section        Medication List      TAKE these medications    aspirin EC 81 MG tablet Take 2 tablets (162 mg total) by mouth daily. Start taking when you are [redacted] weeks pregnant for rest of pregnancy for prevention of preeclampsia   Blood Glucose Monitoring Suppl Devi 1 each by Does not apply route in the morning, at noon, and at bedtime. May substitute to any manufacturer covered by patient's insurance.   BLOOD GLUCOSE TEST STRIPS Strp 1 each by In Vitro route in the morning, at noon, and at bedtime. May substitute to any manufacturer covered by patient's insurance.   Lancet Device Misc 1 each by Does not apply route in the morning, at noon, and at bedtime. May substitute to any manufacturer covered by patient's insurance.   Lancets Misc. Misc 1 each by Does not apply route in the morning, at noon, and at bedtime. May substitute to any manufacturer covered by patient's insurance.   metroNIDAZOLE 500 MG tablet Commonly known as: FLAGYL Take 1 tablet (500 mg total) by mouth 2 (two) times daily.   valACYclovir 500 MG tablet Commonly known as: Valtrex Take 1 tablet (500 mg total) by mouth 2 (two) times daily.

## 2022-10-20 ENCOUNTER — Encounter (HOSPITAL_COMMUNITY): Payer: Self-pay

## 2022-10-20 ENCOUNTER — Telehealth (HOSPITAL_COMMUNITY): Payer: Self-pay | Admitting: *Deleted

## 2022-10-20 LAB — GC/CHLAMYDIA PROBE AMP (~~LOC~~) NOT AT ARMC
Chlamydia: NEGATIVE
Comment: NEGATIVE
Comment: NORMAL
Neisseria Gonorrhea: NEGATIVE

## 2022-10-20 NOTE — Telephone Encounter (Signed)
Preadmission screen  

## 2022-10-21 ENCOUNTER — Ambulatory Visit: Payer: Medicaid Other | Attending: Maternal & Fetal Medicine

## 2022-10-21 ENCOUNTER — Telehealth (HOSPITAL_COMMUNITY): Payer: Self-pay | Admitting: *Deleted

## 2022-10-21 NOTE — Telephone Encounter (Signed)
Preadmission screen  

## 2022-10-24 ENCOUNTER — Telehealth (INDEPENDENT_AMBULATORY_CARE_PROVIDER_SITE_OTHER): Payer: Medicaid Other | Admitting: Obstetrics and Gynecology

## 2022-10-24 DIAGNOSIS — O34219 Maternal care for unspecified type scar from previous cesarean delivery: Secondary | ICD-10-CM

## 2022-10-24 DIAGNOSIS — R8271 Bacteriuria: Secondary | ICD-10-CM

## 2022-10-24 DIAGNOSIS — O99213 Obesity complicating pregnancy, third trimester: Secondary | ICD-10-CM

## 2022-10-24 DIAGNOSIS — Z6841 Body Mass Index (BMI) 40.0 and over, adult: Secondary | ICD-10-CM

## 2022-10-24 DIAGNOSIS — O24410 Gestational diabetes mellitus in pregnancy, diet controlled: Secondary | ICD-10-CM

## 2022-10-24 DIAGNOSIS — Z98891 History of uterine scar from previous surgery: Secondary | ICD-10-CM

## 2022-10-24 DIAGNOSIS — O99820 Streptococcus B carrier state complicating pregnancy: Secondary | ICD-10-CM

## 2022-10-24 DIAGNOSIS — Z3A38 38 weeks gestation of pregnancy: Secondary | ICD-10-CM

## 2022-10-24 DIAGNOSIS — O09299 Supervision of pregnancy with other poor reproductive or obstetric history, unspecified trimester: Secondary | ICD-10-CM

## 2022-10-24 DIAGNOSIS — O09293 Supervision of pregnancy with other poor reproductive or obstetric history, third trimester: Secondary | ICD-10-CM

## 2022-10-24 DIAGNOSIS — E669 Obesity, unspecified: Secondary | ICD-10-CM

## 2022-10-24 DIAGNOSIS — O0990 Supervision of high risk pregnancy, unspecified, unspecified trimester: Secondary | ICD-10-CM

## 2022-10-24 NOTE — Progress Notes (Signed)
OBSTETRICS PRENATAL VIRTUAL VISIT ENCOUNTER NOTE  Provider location: Center for [facility]   Patient location: Home  I connected with Angela Flynn on 10/24/22 at  1:15 PM EDT by MyChart Video Encounter and verified that I am speaking with the correct person using two identifiers. I discussed the limitations, risks, security and privacy concerns of performing an evaluation and management service virtually and the availability of in person appointments. I also discussed with the patient that there may be a patient responsible charge related to this service. The patient expressed understanding and agreed to proceed. Subjective:  Angela Flynn is a 27 y.o. G3P1011 at [redacted]w[redacted]d being seen today for ongoing prenatal care.  She is currently monitored for the following issues for this high-risk pregnancy and has Hx of preeclampsia, prior pregnancy, currently pregnant; Obesity affecting pregnancy; Supervision of high risk pregnancy, antepartum; History of C-section; Echogenic intracardiac focus of fetus on prenatal ultrasound; Trichomonas infection; Prediabetes; Gestational diabetes; GBS bacteriuria; and BMI 45.0-49.9, adult (HCC) on their problem list.  Patient reports no complaints.  Contractions: Not present. Vag. Bleeding: None.  Movement: Present. Denies any leaking of fluid.   The following portions of the patient's history were reviewed and updated as appropriate: allergies, current medications, past family history, past medical history, past social history, past surgical history and problem list.   Objective:  There were no vitals filed for this visit.  Fetal Status:     Movement: Present     General:  Alert, oriented and cooperative. Patient is in no acute distress.  Respiratory: Normal respiratory effort, no problems with respiration noted  Mental Status: Normal mood and affect. Normal behavior. Normal judgment and thought content.  Rest of physical  exam deferred due to type of encounter  Imaging: [place] MFM FETAL BPP WO NON STRESS  Result Date: 09/30/2022 ----------------------------------------------------------------------  OBSTETRICS REPORT                       (Signed Final 09/30/2022 04:54 pm) ---------------------------------------------------------------------- Patient Info  ID #:       [id number]                          D.O.B.:  [birth year] (27 yrs)  Name:       Angela Flynn             Visit Date: 09/30/2022 03:41 pm ---------------------------------------------------------------------- Performed By  Attending:        Braxton Feathers DO       Ref. Address:     [address]                                                             [place], [place]                                                             [address]  Performed By:     Tommie Raymond BS,       [facility]                                [facility]  Referred By:      [facility] ---------------------------------------------------------------------- Orders  #  Description                           Code        Ordered By  1  [place] MFM FETAL BPP WO NON               76819.01    BURK SCHAIBLE     STRESS  2  [place] MFM OB FOLLOW UP                   76816.01    [facility] ----------------------------------------------------------------------  #  Order #                     Accession #                Episode #  1  [id number]                   [id number]                 [id number]  2  [id number]                   [id number]                 [id number] ---------------------------------------------------------------------- Indications  [redacted] weeks gestation of pregnancy                Z3A.34  Poor obstetric history: Previous preeclampsia  O09.299  Obesity complicating pregnancy, third           O99.213  trimester (BMI 41)  History of cesarean delivery, currently        O34.219  pregnant  Cannabinoid exposure in pregnancy              O99.323  (05/02/22)  Antenatal follow-up for nonvisualized fetal    Z36.2  anatomy  Gestational diabetes in pregnancy, diet        O24.410  controlled ---------------------------------------------------------------------- Fetal Evaluation  Num Of Fetuses:         1  Fetal Heart Rate(bpm):  137  Cardiac Activity:       Observed  Presentation:  Cephalic  Placenta:               Posterior  P. Cord Insertion:      Previously visualized  Amniotic Fluid  AFI FV:      Within normal limits  AFI Sum(cm)     %Tile       Largest Pocket(cm)  14.92           54          5.34  RUQ(cm)       RLQ(cm)       LUQ(cm)        LLQ(cm)  5.34          4.07          2.44           3.07 ---------------------------------------------------------------------- Biophysical Evaluation  Amniotic F.V:   Pocket => 2 cm             F. Tone:        Observed  F. Movement:    Observed                   Score:          8/8  F. Breathing:   Observed ---------------------------------------------------------------------- Biometry  BPD:      85.3  mm     G. Age:  34w 3d         37  %    CI:        73.83   %    70 - 86                                                          FL/HC:      21.9   %    20.1 - 22.3  HC:      315.3  mm     G. Age:  35w 3d         28  %    HC/AC:      0.96        0.93 - 1.11  AC:      327.7  mm     G. Age:  36w 5d         94  %    FL/BPD:     81.1   %    71 - 87  FL:       69.2  mm     G. Age:  35w 4d         60  %    FL/AC:      21.1   %    20 - 24  LV:        3.1  mm  Est. FW:    2819  gm      6 lb 3 oz     78  % ---------------------------------------------------------------------- OB History  Gravidity:    3         Term:   1         SAB:   1  Living:       1 ---------------------------------------------------------------------- Gestational Age  LMP:           33w 1d         Date:  02/10/22  EDD:   11/17/22  U/S Today:     35w 4d                                        EDD:   10/31/22  Best:          34w 6d     Det. By:  U/S  (06/27/22)          EDD:   11/05/22 ---------------------------------------------------------------------- Anatomy  Cranium:               Appears normal         LVOT:                   Not well visualized  Cavum:                 Appears normal         Aortic Arch:            Not well visualized  Ventricles:            Appears normal         Ductal Arch:            Not well visualized  Choroid Plexus:        Previously seen        Diaphragm:              Appears normal  Cerebellum:            Previously seen        Stomach:                Appears normal, left                                                                        sided  Posterior Fossa:       Previously seen        Abdomen:                Previously seen  Nuchal Fold:           Not applicable (>20    Abdominal Wall:         Previously seen                         wks GA)  Face:                  Orbits and profile     Cord Vessels:           Previously seen                         previously seen  Lips:                  Previously seen        Kidneys:                Pelvic kidney  Palate:                Not well visualized  Bladder:                Appears normal  Thoracic:              Appears normal         Spine:                  Previously seen  Heart:                 Appears normal; EIF    Upper Extremities:      Previously seen  RVOT:                  Not well visualized    Lower Extremities:      Previously seen  Other:  Female gender, Nasal bone, lenses, falx, Heels/feet, hands/5th digits,          VC, and 3VV previously visualized. Technically difficult due to          maternal habitus and fetal position. ---------------------------------------------------------------------- Cervix Uterus Adnexa  Cervix  Normal appearance by transabdominal scan  Uterus  No abnormality visualized.   Right Ovary  Within normal limits.  Left Ovary  Within normal limits.  Cul De Sac  No free fluid seen.  Adnexa  No abnormality visualized ---------------------------------------------------------------------- Comments  The patient is here for a follow-up BPP and growth ultrasound  at 34w 6d. EDD: 11/05/2022 dated by U/S  (06/27/22).  She  appears to have failed her GCT but was not told about it. She  will call monday to see about getting referred to diabetic  education.  Sonographic findings  Single intrauterine pregnancy.  Fetal cardiac activity: Observed.  Presentation: Cephalic.  Interval fetal anatomy appears normal except for what  appears to be a right sided pelvic kidney.  Fetal biometry shows the estimated fetal weight at the 78  percentile.  Amniotic fluid volume: Within normal limits. AFI: 14.92 cm.  MVP: 5.34 cm.  Placenta: Posterior.  BPP: 8/8.  Recommendations  1. Weekly antenatal testing until delivery  2. Growth ultrasounds every 4-6 weeks until delivery  3. Delivery around [redacted] weeks gestation  4. Messaged her OB provider to have diabetes supplies sent  asap ----------------------------------------------------------------------                  Braxton Feathers, DO Electronically Signed Final Report   09/30/2022 04:54 pm ----------------------------------------------------------------------  [place] MFM OB FOLLOW UP  Result Date: 09/30/2022 ----------------------------------------------------------------------  OBSTETRICS REPORT                       (Signed Final 09/30/2022 04:54 pm) ---------------------------------------------------------------------- Patient Info  ID #:       [id number]                          D.O.B.:  26-Feb-1996 (27 yrs)  Name:       Angela Raring Schild             Visit Date: 09/30/2022 03:41 pm ---------------------------------------------------------------------- Performed By  Attending:        Braxton Feathers DO       Ref. Address:     [address]  [place], [place]                                                             [address]  Performed By:     Tommie Raymond BS,       [facility]                                [facility]  Referred By:      [facility] ---------------------------------------------------------------------- Orders  #  Description                           Code        Ordered By  1  [place] MFM FETAL BPP WO NON               76819.01    BURK SCHAIBLE     STRESS  2  [place] MFM OB FOLLOW UP                   [id number]    [facility] ----------------------------------------------------------------------  #  Order #                     Accession #                Episode #  1  [id number]                   [id number]                 [id number]  2  [id number]                   [id number]                 [id number] ---------------------------------------------------------------------- Indications  [redacted] weeks gestation of pregnancy                Z3A.34  Poor obstetric history: Previous preeclampsia  O09.299  Obesity complicating pregnancy, third          O99.213  trimester (BMI 41)  History of cesarean delivery, currently        O34.219  pregnant  Cannabinoid exposure in pregnancy              O99.323  (05/02/22)  Antenatal follow-up for nonvisualized fetal    Z36.2  anatomy  Gestational diabetes in pregnancy, diet        O24.410  controlled ---------------------------------------------------------------------- Fetal Evaluation  Num Of Fetuses:         1  Fetal Heart Rate(bpm):  137  Cardiac Activity:       Observed  Presentation:           Cephalic  Placenta:               Posterior  P. Cord Insertion:      Previously visualized  Amniotic Fluid  AFI FV:      Within normal limits  AFI Sum(cm)     %Tile       Largest  Pocket(cm)  14.92           54          5.34  RUQ(cm)       RLQ(cm)       LUQ(cm)        LLQ(cm)  5.34          4.07          2.44           3.07 ---------------------------------------------------------------------- Biophysical Evaluation  Amniotic F.V:   Pocket => 2 cm             F. Tone:        Observed  F. Movement:    Observed                   Score:          8/8  F. Breathing:   Observed ---------------------------------------------------------------------- Biometry  BPD:      85.3  mm     G. Age:  34w 3d         37  %    CI:        73.83   %    70 - 86                                                          FL/HC:      21.9   %    20.1 - 22.3  HC:      315.3  mm     G. Age:  35w 3d         28  %    HC/AC:      0.96        0.93 - 1.11  AC:      327.7  mm     G. Age:  36w 5d         94  %    FL/BPD:     81.1   %    71 - 87  FL:       69.2  mm     G. Age:  35w 4d         60  %    FL/AC:      21.1   %    20 - 24  LV:        3.1  mm  Est. FW:  2819  gm      6 lb 3 oz     78  % ---------------------------------------------------------------------- OB History  Gravidity:    3         Term:   1         SAB:   1  Living:       1 ---------------------------------------------------------------------- Gestational Age  LMP:           33w 1d        Date:  02/10/22                  EDD:   11/17/22  U/S Today:     35w 4d                                        EDD:   10/31/22  Best:          34w 6d     Det. By:  U/S  (06/27/22)          EDD:   11/05/22 ---------------------------------------------------------------------- Anatomy  Cranium:               Appears normal         LVOT:                   Not well visualized  Cavum:                 Appears normal         Aortic Arch:            Not well visualized  Ventricles:            Appears normal         Ductal Arch:            Not well visualized  Choroid Plexus:        Previously seen        Diaphragm:              Appears normal  Cerebellum:            Previously seen         Stomach:                Appears normal, left                                                                        sided  Posterior Fossa:       Previously seen        Abdomen:                Previously seen  Nuchal Fold:           Not applicable (>20    Abdominal Wall:         Previously seen                         wks GA)  Face:                  Orbits and profile     Cord Vessels:  Previously seen                         previously seen  Lips:                  Previously seen        Kidneys:                Pelvic kidney  Palate:                Not well visualized    Bladder:                Appears normal  Thoracic:              Appears normal         Spine:                  Previously seen  Heart:                 Appears normal; EIF    Upper Extremities:      Previously seen  RVOT:                  Not well visualized    Lower Extremities:      Previously seen  Other:  Female gender, Nasal bone, lenses, falx, Heels/feet, hands/5th digits,          VC, and 3VV previously visualized. Technically difficult due to          maternal habitus and fetal position. ---------------------------------------------------------------------- Cervix Uterus Adnexa  Cervix  Normal appearance by transabdominal scan  Uterus  No abnormality visualized.  Right Ovary  Within normal limits.  Left Ovary  Within normal limits.  Cul De Sac  No free fluid seen.  Adnexa  No abnormality visualized ---------------------------------------------------------------------- Comments  The patient is here for a follow-up BPP and growth ultrasound  at 34w 6d. EDD: 11/05/2022 dated by U/S  (06/27/22).  She  appears to have failed her GCT but was not told about it. She  will call monday to see about getting referred to diabetic  education.  Sonographic findings  Single intrauterine pregnancy.  Fetal cardiac activity: Observed.  Presentation: Cephalic.  Interval fetal anatomy appears normal except for what  appears to be a right sided pelvic  kidney.  Fetal biometry shows the estimated fetal weight at the 78  percentile.  Amniotic fluid volume: Within normal limits. AFI: 14.92 cm.  MVP: 5.34 cm.  Placenta: Posterior.  BPP: 8/8.  Recommendations  1. Weekly antenatal testing until delivery  2. Growth ultrasounds every 4-6 weeks until delivery  3. Delivery around [redacted] weeks gestation  4. Messaged her OB provider to have diabetes supplies sent  asap ----------------------------------------------------------------------                  Braxton Feathers, DO Electronically Signed Final Report   09/30/2022 04:54 pm ----------------------------------------------------------------------   Assessment and Plan:  Pregnancy: G3P1011 at [redacted]w[redacted]d 1. [redacted] weeks gestation of pregnancy   2. Diet controlled gestational diabetes mellitus (GDM) in third trimester Pt did not have blood sugars readily available  3. Supervision of high risk pregnancy, antepartum Continue routine prenatal care  4. Obesity affecting pregnancy in third trimester, unspecified obesity type   5. Hx of preeclampsia, prior pregnancy, currently pregnant BP 130/70  on 10/23/22  6. History of C-section Pt desires TOLAC  7. GBS bacteriuria Treat in labor  8. BMI  45.0-49.9, adult [facility])   Term labor symptoms and general obstetric precautions including but not limited to vaginal bleeding, contractions, leaking of fluid and fetal movement were reviewed in detail with the patient. I discussed the assessment and treatment plan with the patient. The patient was provided an opportunity to ask questions and all were answered. The patient agreed with the plan and demonstrated an understanding of the instructions. The patient was advised to call back or seek an in-person office evaluation/go to [facility]U at [facility] for any urgent or concerning symptoms. Please refer to After Visit Summary for other counseling recommendations.   I provided 10 minutes of face-to-face time during this  encounter.  Return in about 1 week (around 10/31/2022) for [facility], in person.  Future Appointments  Date Time Provider Department Center  10/28/2022  3:45 PM [facility] US5 [facility]  11/03/2022  6:30 AM [facility] SCHED ROOM [facility] None  11/04/2022  3:15 PM [facility] NST [facility]  11/11/2022  3:15 PM [facility] NST [facility]    Warden Fillers, MD Center for [employer], [facility]

## 2022-10-28 ENCOUNTER — Ambulatory Visit: Payer: Medicaid Other | Attending: Maternal & Fetal Medicine

## 2022-10-28 DIAGNOSIS — Z8632 Personal history of gestational diabetes: Secondary | ICD-10-CM | POA: Diagnosis present

## 2022-10-28 DIAGNOSIS — O09299 Supervision of pregnancy with other poor reproductive or obstetric history, unspecified trimester: Secondary | ICD-10-CM | POA: Diagnosis present

## 2022-10-28 DIAGNOSIS — O09293 Supervision of pregnancy with other poor reproductive or obstetric history, third trimester: Secondary | ICD-10-CM

## 2022-10-28 DIAGNOSIS — O24410 Gestational diabetes mellitus in pregnancy, diet controlled: Secondary | ICD-10-CM

## 2022-10-28 DIAGNOSIS — E669 Obesity, unspecified: Secondary | ICD-10-CM

## 2022-10-28 DIAGNOSIS — Z362 Encounter for other antenatal screening follow-up: Secondary | ICD-10-CM

## 2022-10-28 DIAGNOSIS — O3663X0 Maternal care for excessive fetal growth, third trimester, not applicable or unspecified: Secondary | ICD-10-CM | POA: Diagnosis not present

## 2022-10-28 DIAGNOSIS — O99212 Obesity complicating pregnancy, second trimester: Secondary | ICD-10-CM | POA: Diagnosis present

## 2022-10-28 DIAGNOSIS — O34219 Maternal care for unspecified type scar from previous cesarean delivery: Secondary | ICD-10-CM | POA: Diagnosis not present

## 2022-10-28 DIAGNOSIS — Z3A38 38 weeks gestation of pregnancy: Secondary | ICD-10-CM

## 2022-10-31 ENCOUNTER — Telehealth (HOSPITAL_COMMUNITY): Payer: Self-pay | Admitting: *Deleted

## 2022-10-31 NOTE — Telephone Encounter (Signed)
Preadmission screen  

## 2022-11-01 ENCOUNTER — Encounter (HOSPITAL_COMMUNITY): Payer: Self-pay | Admitting: *Deleted

## 2022-11-01 ENCOUNTER — Telehealth (HOSPITAL_COMMUNITY): Payer: Self-pay | Admitting: *Deleted

## 2022-11-01 NOTE — Telephone Encounter (Signed)
Preadmission screen  

## 2022-11-02 ENCOUNTER — Other Ambulatory Visit: Payer: Self-pay | Admitting: Advanced Practice Midwife

## 2022-11-02 DIAGNOSIS — O24410 Gestational diabetes mellitus in pregnancy, diet controlled: Secondary | ICD-10-CM

## 2022-11-03 ENCOUNTER — Inpatient Hospital Stay (HOSPITAL_COMMUNITY): Payer: Medicaid Other

## 2022-11-04 ENCOUNTER — Other Ambulatory Visit: Payer: Self-pay

## 2022-11-04 ENCOUNTER — Inpatient Hospital Stay (HOSPITAL_COMMUNITY): Payer: Medicaid Other | Admitting: Anesthesiology

## 2022-11-04 ENCOUNTER — Inpatient Hospital Stay (HOSPITAL_COMMUNITY)
Admission: RE | Admit: 2022-11-04 | Discharge: 2022-11-07 | DRG: 786 | Disposition: A | Discharge status: Home / Self Care | Payer: Medicaid Other | Attending: Family Medicine | Admitting: Family Medicine

## 2022-11-04 ENCOUNTER — Encounter (HOSPITAL_COMMUNITY): Payer: Self-pay | Admitting: Family Medicine

## 2022-11-04 ENCOUNTER — Ambulatory Visit: Payer: Medicaid Other

## 2022-11-04 DIAGNOSIS — Z8759 Personal history of other complications of pregnancy, childbirth and the puerperium: Secondary | ICD-10-CM

## 2022-11-04 DIAGNOSIS — A6000 Herpesviral infection of urogenital system, unspecified: Secondary | ICD-10-CM | POA: Diagnosis present

## 2022-11-04 DIAGNOSIS — O4593 Premature separation of placenta, unspecified, third trimester: Secondary | ICD-10-CM | POA: Diagnosis present

## 2022-11-04 DIAGNOSIS — Z3A39 39 weeks gestation of pregnancy: Secondary | ICD-10-CM | POA: Diagnosis not present

## 2022-11-04 DIAGNOSIS — O99214 Obesity complicating childbirth: Secondary | ICD-10-CM | POA: Diagnosis present

## 2022-11-04 DIAGNOSIS — Z560 Unemployment, unspecified: Secondary | ICD-10-CM | POA: Diagnosis not present

## 2022-11-04 DIAGNOSIS — O9832 Other infections with a predominantly sexual mode of transmission complicating childbirth: Secondary | ICD-10-CM | POA: Diagnosis present

## 2022-11-04 DIAGNOSIS — Z98891 History of uterine scar from previous surgery: Secondary | ICD-10-CM

## 2022-11-04 DIAGNOSIS — O0990 Supervision of high risk pregnancy, unspecified, unspecified trimester: Secondary | ICD-10-CM

## 2022-11-04 DIAGNOSIS — Z6841 Body Mass Index (BMI) 40.0 and over, adult: Secondary | ICD-10-CM

## 2022-11-04 DIAGNOSIS — O2230 Deep phlebothrombosis in pregnancy, unspecified trimester: Secondary | ICD-10-CM | POA: Diagnosis not present

## 2022-11-04 DIAGNOSIS — O24420 Gestational diabetes mellitus in childbirth, diet controlled: Principal | ICD-10-CM | POA: Diagnosis present

## 2022-11-04 DIAGNOSIS — Z86718 Personal history of other venous thrombosis and embolism: Secondary | ICD-10-CM | POA: Diagnosis not present

## 2022-11-04 DIAGNOSIS — Z3A40 40 weeks gestation of pregnancy: Secondary | ICD-10-CM

## 2022-11-04 DIAGNOSIS — O24419 Gestational diabetes mellitus in pregnancy, unspecified control: Secondary | ICD-10-CM | POA: Diagnosis present

## 2022-11-04 DIAGNOSIS — O1092 Unspecified pre-existing hypertension complicating childbirth: Secondary | ICD-10-CM | POA: Diagnosis present

## 2022-11-04 DIAGNOSIS — O99824 Streptococcus B carrier state complicating childbirth: Secondary | ICD-10-CM | POA: Diagnosis present

## 2022-11-04 DIAGNOSIS — O34211 Maternal care for low transverse scar from previous cesarean delivery: Secondary | ICD-10-CM | POA: Diagnosis present

## 2022-11-04 DIAGNOSIS — O99820 Streptococcus B carrier state complicating pregnancy: Secondary | ICD-10-CM | POA: Diagnosis not present

## 2022-11-04 DIAGNOSIS — O09299 Supervision of pregnancy with other poor reproductive or obstetric history, unspecified trimester: Secondary | ICD-10-CM

## 2022-11-04 DIAGNOSIS — O1002 Pre-existing essential hypertension complicating childbirth: Secondary | ICD-10-CM | POA: Diagnosis not present

## 2022-11-04 DIAGNOSIS — I10 Essential (primary) hypertension: Secondary | ICD-10-CM | POA: Diagnosis present

## 2022-11-04 DIAGNOSIS — O24410 Gestational diabetes mellitus in pregnancy, diet controlled: Secondary | ICD-10-CM | POA: Diagnosis present

## 2022-11-04 DIAGNOSIS — Z8614 Personal history of Methicillin resistant Staphylococcus aureus infection: Secondary | ICD-10-CM

## 2022-11-04 DIAGNOSIS — R8271 Bacteriuria: Secondary | ICD-10-CM | POA: Diagnosis present

## 2022-11-04 DIAGNOSIS — O99210 Obesity complicating pregnancy, unspecified trimester: Secondary | ICD-10-CM | POA: Diagnosis present

## 2022-11-04 HISTORY — DX: Essential (primary) hypertension: I10

## 2022-11-04 LAB — TYPE AND SCREEN
ABO/RH(D): O POS
Antibody Screen: NEGATIVE

## 2022-11-04 LAB — CBC
HCT: 35.1 % — ABNORMAL LOW (ref 36.0–46.0)
HCT: 33.2 % — ABNORMAL LOW (ref 36.0–46.0)
Hemoglobin: 11.6 g/dL — ABNORMAL LOW (ref 12.0–15.0)
Hemoglobin: 11.1 g/dL — ABNORMAL LOW (ref 12.0–15.0)
MCH: 28.3 pg (ref 26.0–34.0)
MCH: 28 pg (ref 26.0–34.0)
MCHC: 33 g/dL (ref 30.0–36.0)
MCHC: 33.4 g/dL (ref 30.0–36.0)
MCV: 85.6 fL (ref 80.0–100.0)
MCV: 83.8 fL (ref 80.0–100.0)
Platelets: 310 10*3/uL (ref 150–400)
Platelets: 304 10*3/uL (ref 150–400)
RBC: 4.1 MIL/uL (ref 3.87–5.11)
RBC: 3.96 MIL/uL (ref 3.87–5.11)
RDW: 13.9 % (ref 11.5–15.5)
RDW: 13.9 % (ref 11.5–15.5)
WBC: 9.9 10*3/uL (ref 4.0–10.5)
WBC: 9.9 10*3/uL (ref 4.0–10.5)
nRBC: 0 % (ref 0.0–0.2)
nRBC: 0 % (ref 0.0–0.2)

## 2022-11-04 LAB — COMPREHENSIVE METABOLIC PANEL
ALT: 10 U/L (ref 0–44)
AST: 13 U/L — ABNORMAL LOW (ref 15–41)
Albumin: 2.8 g/dL — ABNORMAL LOW (ref 3.5–5.0)
Alkaline Phosphatase: 126 U/L (ref 38–126)
Anion gap: 9 (ref 5–15)
BUN: 10 mg/dL (ref 6–20)
CO2: 22 mmol/L (ref 22–32)
Calcium: 9.2 mg/dL (ref 8.9–10.3)
Chloride: 106 mmol/L (ref 98–111)
Creatinine, Ser: 0.46 mg/dL (ref 0.44–1.00)
GFR, Estimated: >60 mL/min (ref >60)
Glucose, Bld: 133 mg/dL — ABNORMAL HIGH (ref 70–99)
Potassium: 3.3 mmol/L — ABNORMAL LOW (ref 3.5–5.1)
Sodium: 137 mmol/L (ref 135–145)
Total Bilirubin: 0.5 mg/dL (ref 0.3–1.2)
Total Protein: 6.7 g/dL (ref 6.5–8.1)

## 2022-11-04 LAB — PROTEIN / CREATININE RATIO, URINE
Creatinine, Urine: 141 mg/dL
Protein Creatinine Ratio: 0.07 mg/mg{Cre} (ref 0.00–0.15)
Total Protein, Urine: 10 mg/dL

## 2022-11-04 LAB — GLUCOSE, CAPILLARY
Glucose-Capillary: 107 mg/dL — ABNORMAL HIGH (ref 70–99)
Glucose-Capillary: 114 mg/dL — ABNORMAL HIGH (ref 70–99)
Glucose-Capillary: 73 mg/dL (ref 70–99)
Glucose-Capillary: 78 mg/dL (ref 70–99)
Glucose-Capillary: 89 mg/dL (ref 70–99)

## 2022-11-04 LAB — RPR: RPR Ser Ql: NONREACTIVE

## 2022-11-04 MED ORDER — LACTATED RINGERS IV SOLN
500.0000 mL | INTRAVENOUS | Status: DC | PRN
  Administered 2022-11-04 – 2022-11-05 (×2): 500 mL via INTRAVENOUS

## 2022-11-04 MED ORDER — PHENYLEPHRINE 80 MCG/ML (10ML) SYRINGE FOR IV PUSH (FOR BLOOD PRESSURE SUPPORT): 80.0000 ug | PREFILLED_SYRINGE | INTRAVENOUS | Status: DC | PRN

## 2022-11-04 MED ORDER — SODIUM CHLORIDE 0.9 % IV SOLN
5.0000 10*6.[IU] | Freq: Once | INTRAVENOUS | Status: AC
  Administered 2022-11-04: 5 10*6.[IU] via INTRAVENOUS
  Filled 2022-11-04: qty 5

## 2022-11-04 MED ORDER — EPHEDRINE 5 MG/ML INJ: 10.0000 mg | INTRAVENOUS | Status: DC | PRN

## 2022-11-04 MED ORDER — OXYCODONE-ACETAMINOPHEN 5-325 MG PO TABS: 2.0000 | ORAL_TABLET | ORAL | Status: DC | PRN

## 2022-11-04 MED ORDER — FENTANYL-BUPIVACAINE-NACL 0.5-0.125-0.9 MG/250ML-% EP SOLN
12.0000 mL/h | EPIDURAL | Status: DC | PRN
  Administered 2022-11-04 – 2022-11-05 (×2): 12 mL/h via EPIDURAL
  Filled 2022-11-04 (×2): qty 250

## 2022-11-04 MED ORDER — LIDOCAINE HCL (PF) 1 % IJ SOLN: 30.0000 mL | INTRAMUSCULAR | Status: DC | PRN

## 2022-11-04 MED ORDER — LACTATED RINGERS IV SOLN
500.0000 mL | Freq: Once | INTRAVENOUS | Status: AC
  Administered 2022-11-04: 500 mL via INTRAVENOUS

## 2022-11-04 MED ORDER — TERBUTALINE SULFATE 1 MG/ML IJ SOLN: 0.2500 mg | Freq: Once | INTRAMUSCULAR | Status: DC | PRN

## 2022-11-04 MED ORDER — OXYCODONE-ACETAMINOPHEN 5-325 MG PO TABS: 1.0000 | ORAL_TABLET | ORAL | Status: DC | PRN

## 2022-11-04 MED ORDER — SOD CITRATE-CITRIC ACID 500-334 MG/5ML PO SOLN
30.0000 mL | ORAL | Status: DC | PRN
  Administered 2022-11-05: 30 mL via ORAL
  Filled 2022-11-04 (×2): qty 30

## 2022-11-04 MED ORDER — PENICILLIN G POT IN DEXTROSE 60000 UNIT/ML IV SOLN
3.0000 10*6.[IU] | INTRAVENOUS | Status: DC
  Administered 2022-11-04 – 2022-11-05 (×6): 3 10*6.[IU] via INTRAVENOUS
  Filled 2022-11-04 (×7): qty 50

## 2022-11-04 MED ORDER — OXYTOCIN BOLUS FROM INFUSION: 333.0000 mL | Freq: Once | INTRAVENOUS | Status: DC

## 2022-11-04 MED ORDER — LIDOCAINE-EPINEPHRINE (PF) 2 %-1:200000 IJ SOLN
INTRAMUSCULAR | Status: DC | PRN
  Administered 2022-11-04 – 2022-11-05 (×3): 5 mL via EPIDURAL
  Administered 2022-11-05: 4 mL via EPIDURAL

## 2022-11-04 MED ORDER — FENTANYL CITRATE (PF) 100 MCG/2ML IJ SOLN
100.0000 ug | INTRAMUSCULAR | Status: DC | PRN
  Administered 2022-11-04 (×2): 100 ug via INTRAVENOUS
  Filled 2022-11-04 (×2): qty 2

## 2022-11-04 MED ORDER — OXYTOCIN-SODIUM CHLORIDE 30-0.9 UT/500ML-% IV SOLN
2.5000 [IU]/h | INTRAVENOUS | Status: DC
  Administered 2022-11-05: 2.5 [IU]/h via INTRAVENOUS

## 2022-11-04 MED ORDER — ACETAMINOPHEN 325 MG PO TABS: 650.0000 mg | ORAL_TABLET | ORAL | Status: DC | PRN

## 2022-11-04 MED ORDER — DIPHENHYDRAMINE HCL 50 MG/ML IJ SOLN
12.5000 mg | INTRAMUSCULAR | Status: DC | PRN
  Administered 2022-11-05: 12.5 mg via INTRAVENOUS
  Filled 2022-11-04: qty 1

## 2022-11-04 MED ORDER — OXYTOCIN-SODIUM CHLORIDE 30-0.9 UT/500ML-% IV SOLN
1.0000 m[IU]/min | INTRAVENOUS | Status: DC
  Administered 2022-11-04: 2 m[IU]/min via INTRAVENOUS
  Filled 2022-11-04: qty 500

## 2022-11-04 MED ORDER — LACTATED RINGERS IV SOLN: INTRAVENOUS | Status: DC

## 2022-11-04 MED ORDER — PHENYLEPHRINE 80 MCG/ML (10ML) SYRINGE FOR IV PUSH (FOR BLOOD PRESSURE SUPPORT)
80.0000 ug | PREFILLED_SYRINGE | INTRAVENOUS | Status: DC | PRN
  Filled 2022-11-04: qty 10

## 2022-11-04 MED ORDER — ONDANSETRON HCL 4 MG/2ML IJ SOLN
4.0000 mg | Freq: Four times a day (QID) | INTRAMUSCULAR | Status: DC | PRN
  Administered 2022-11-04 – 2022-11-05 (×2): 4 mg via INTRAVENOUS
  Filled 2022-11-04 (×2): qty 2

## 2022-11-04 NOTE — Progress Notes (Signed)
Patient Vitals for the past 4 hrs:  BP Temp Temp src Pulse SpO2  11/04/22 0724 -- -- -- -- 100 %  11/04/22 0719 -- -- -- -- 100 %  11/04/22 0717 (!) 117/38 -- -- 85 --  11/04/22 0714 -- -- -- -- 100 %  11/04/22 0709 -- -- -- -- 100 %  11/04/22 0704 -- -- -- -- 99 %  11/04/22 0631 (!) 112/57 97.9 F (36.6 C) Oral 92 --  11/04/22 0601 (!) 103/50 -- -- 86 --  11/04/22 0531 (!) 94/48 -- -- 83 --  11/04/22 0501 (!) 93/34 -- -- 87 --  11/04/22 0431 111/65 -- -- 85 --   Requesting Nitrous.  Feels "crampy".  Pitocin at 6 mu/min.  Cx still closed per RN.  FHR Cat 1.  Blood sugar 78. Continue present mgt.

## 2022-11-04 NOTE — H&P (Signed)
Angela Flynn is a 27 y.o. female [phone] with IUP at [redacted]w[redacted]d presenting for IOL for A1DM. [facility] at [facility] since 15 wks  Prenatal History/Complications:  Hx:    -CS for NRFHT 2020  -States had DVT after pregnancy and "took shots"  for 6 weeks  -GHTN  Current pregnancy:  A1DM;  states was told to only check fastings:  95-96 per pt  EFW 9#1oz 38 weeks  HSV on PPX  -Review of BPs reveal pt has had ^ readings (145-153/80-93)  on 10 occasions since 2022 outside of pregnancy  Past Medical History: Past Medical History:  Diagnosis Date   Allergy    seasonal, dusts   Complication of anesthesia    Felt like she couldn't breathe with mask on after 1 procedure   DVT (deep vein thrombosis) in pregnancy    Gestational diabetes    Headache    2-3 per week   Herpes genitalis    MRSA infection    abscesses - history of   Pregnancy induced hypertension     Past Surgical History: Past Surgical History:  Procedure Laterality Date   CESAREAN SECTION     CHOLECYSTECTOMY, LAPAROSCOPIC     OPEN REDUCTION INTERNAL FIXATION (ORIF) PROXIMAL PHALANX Left 02/04/2022   Procedure: Open reduction and internal fixation of left fifth digit proximal phalanx fracture;  Surgeon: Kennedy Bucker, MD;  Location: [facility];  Service: Orthopedics;  Laterality: Left;   TONSILLECTOMY      Obstetrical History: OB History     Gravida  3   Para  1   Term  1   Preterm      AB  1   Living  1      SAB  1   IAB  0   Ectopic  0   Multiple      Live Births  1           Social History: Social History   Socioeconomic History   Marital status: Single    Spouse name: Not on file   Number of children: 1   Years of education: 12   Highest education level: Not on file  Occupational History   Occupation: unemployed  Tobacco Use   Smoking status: Never   Smokeless tobacco: Never   Tobacco comments:    Black and mild occasionally   Vaping Use   Vaping status: Never Used   Substance and Sexual Activity   Alcohol use: Not Currently   Drug use: Not Currently    Types: Marijuana    Comment: last use November 2023   Sexual activity: Not Currently    Partners: Male    Birth control/protection: None  Other Topics Concern   Not on file  Social History Narrative   Not on file   Social Determinants of Health   Financial Resource Strain: Low Risk  (04/01/2022)   Overall Financial Resource Strain (CARDIA)    Difficulty of Paying Living Expenses: Not hard at all  Food Insecurity: No Food Insecurity (05/25/2022)   Hunger Vital Sign    Worried About Running Out of Food in the Last Year: Never true    Ran Out of Food in the Last Year: Never true  Transportation Needs: No Transportation Needs (05/25/2022)   PRAPARE - Administrator, Civil Service (Medical): No    Lack of Transportation (Non-Medical): No  Physical Activity: Inactive (04/01/2022)   Exercise Vital Sign    Days of Exercise per Week: 0  days    Minutes of Exercise per Session: 0 min  Stress: Stress Concern Present (04/01/2022)   [employer] of Occupational Health - Occupational Stress Questionnaire    Feeling of Stress : Rather much  Social Connections: Unknown (04/01/2022)   Social Connection and Isolation Panel [NHANES]    Frequency of Communication with Friends and Family: Patient declined    Frequency of Social Gatherings with Friends and Family: Never    Attends Religious Services: Never    Database administrator or Organizations: No    Attends Engineer, structural: Never    Marital Status: Not on file    Family History: Family History  Problem Relation Age of Onset   Bipolar disorder Mother    Heart disease Mother    Heart disease Sister    Hypertension Sister    Diabetes Maternal Grandmother    Hypertension Maternal Grandmother     Allergies: Allergies  Allergen Reactions   Tape     Blisters from tape used after c-section   Silicone Rash     Blisters from tape used after c-section    Medications Prior to Admission  Medication Sig Dispense Refill Last Dose   aspirin EC 81 MG tablet Take 2 tablets (162 mg total) by mouth daily. Start taking when you are [redacted] weeks pregnant for rest of pregnancy for prevention of preeclampsia 60 tablet 5    Blood Glucose Monitoring Suppl DEVI 1 each by Does not apply route in the morning, at noon, and at bedtime. May substitute to any manufacturer covered by patient's insurance. 1 each 0    metroNIDAZOLE (FLAGYL) 500 MG tablet Take 1 tablet (500 mg total) by mouth 2 (two) times daily. (Patient not taking: Reported on 10/24/2022) 14 tablet 0    valACYclovir (VALTREX) 500 MG tablet Take 1 tablet (500 mg total) by mouth 2 (two) times daily. 60 tablet 2         Review of Systems   Constitutional: Negative for fever and chills Eyes: Negative for visual disturbances Respiratory: Negative for shortness of breath, dyspnea Cardiovascular: Negative for chest pain or palpitations  Gastrointestinal: Negative for abdominal pain, vomiting, diarrhea and constipation.   Genitourinary: Negative for dysuria and urgency. No HSV prodromal sx Musculoskeletal: Negative for back pain, joint pain, myalgias  Neurological: Negative for dizziness and headaches      Blood pressure (!) 142/90, pulse (!) 107, temperature 98.4 F (36.9 C), temperature source Oral, resp. rate 20, height 5\' 9"  (1.753 m), weight (!) 146.7 kg, last menstrual period 02/10/2022, unknown if currently breastfeeding. General appearance: alert, cooperative, and no distress Lungs: normal respiratory effort Heart: regular rate and rhythm Abdomen: soft, non-tender; bowel sounds normal Pelvic: SSE:  no lesions Extremities: Homans sign is negative, no sign of DVT DTR's 2+ Presentation: cephalic Fetal monitoring  Baseline: 130 bpm, Variability: Good {> 6 bpm), Accelerations: Reactive, and Decelerations: Absent Uterine activity  None  Dilation:  Closed Effacement (%): Thick Station: -3 Exam by:: F. Dishman-Cresenzo, CNM   Prenatal labs: ABO, Rh: O/Positive/-- (01/15 1443) Antibody: Negative (01/15 1443) Rubella: immune RPR: Non Reactive (06/05 1711)  HBsAg: Negative (01/15 1443)  HIV: Non Reactive (06/05 1711)  GBS: Positive/-- (01/22 0000)  Nursing Staff Provider  Office Location [facility] Dating  11/12/2022, by Ultrasound  [facility] Model [ x] Traditional [ ]  Centering [ ]  Mom-Baby Dyad    Language  English Anatomy US  EIF, otherwise normal, f/w MFM  Flu Vaccine  Declined 05/25/22  Genetic/Carrier Screen  NIPS:   neg Materni21 AFP:   normal Horizon:   TDaP Vaccine   Deferred 08/09/22 Hgb A1C or  GTT Early - 5.8%, no follow up done Third trimester   COVID Vaccine none   LAB RESULTS   Rhogam  O/Positive/-- (01/15 1443)  Blood Type O/Positive/-- (01/15 1443)   Baby Feeding Plan Breast/Bottle Antibody Negative (01/15 1443)  Contraception None Rubella 1.33 (01/15 1443)  Circumcision Boy- unsure RPR Non Reactive (01/15 1443)   Pediatrician  unsure HBsAg Negative (01/15 1443)   Support Person Mom HCVAb Non Reactive (01/15 1443)   Prenatal Classes  HIV Non Reactive (01/15 1443)     BTL Consent NA GBS  GBS bacteriuria 04/2022 (For PCN allergy, check sensitivities)   VBAC Consent 08/09/22 Pap Diagnosis  Date Value Ref Range Status  05/02/2022   Final   - Negative for intraepithelial lesion or malignancy (NILM)         DME Rx Arly.Keller ] BP cuff [ ]  Weight Scale Waterbirth  [ ]  Class [ ]  Consent [ ]  CNM visit  PHQ9 & GAD7 [  ] new OB [  ] 28 weeks  [  ] 36 weeks Induction  [ ]  Orders Entered [ ] Foley Y/N    Prenatal Transfer Tool  Maternal Diabetes: Yes:  Diabetes Type:  Diet controlled Genetic Screening: Normal Maternal Ultrasounds/Referrals: Normal Fetal Ultrasounds or other Referrals:  None Maternal Substance Abuse:  No Significant Maternal Medications:  None Significant Maternal Lab Results: Group B Strep  positive    No results found for this or any previous visit (from the past 24 hour(s)).  Assessment: Angela Flynn is a 27 y.o. G3P1011 with an IUP at [redacted]w[redacted]d presenting for IOL for A1DM.  Noted to have CHTN Noted to have a hx of postpartum DVT  Plan: #Labor: >Declines Foley->pitocin to ripen, may consider foley #Pain:  Per request #FWB Cat 1 #ID: GBS: PCN  #DVT:  will need pp tx; SCDs during labor #HTN:  lasix and close monitoring postpartum  11/04/2022, 2:23 AM

## 2022-11-04 NOTE — Anesthesia Preprocedure Evaluation (Signed)
Anesthesia Evaluation  Patient identified by MRN, date of birth, ID band Patient awake    Reviewed: Allergy & Precautions, NPO status , Patient's Chart, lab work & pertinent test results  Airway Mallampati: III  TM Distance: >3 FB Neck ROM: Full    Dental no notable dental hx.    Pulmonary neg pulmonary ROS, Patient abstained from smoking.   Pulmonary exam normal breath sounds clear to auscultation       Cardiovascular hypertension (cHTN), + DVT  Normal cardiovascular exam Rhythm:Regular Rate:Normal     Neuro/Psych  Headaches  negative psych ROS   GI/Hepatic negative GI ROS, Neg liver ROS,,,  Endo/Other  diabetes, Gestational  Morbid obesity (BMI 48)  Renal/GU negative Renal ROS  negative genitourinary   Musculoskeletal negative musculoskeletal ROS (+)    Abdominal   Peds  Hematology negative hematology ROS (+)   Anesthesia Other Findings TOLAC  IOL for gDM  Reproductive/Obstetrics (+) Pregnancy                             Anesthesia Physical Anesthesia Plan  ASA: 3  Anesthesia Plan: Epidural   Post-op Pain Management:    Induction:   PONV Risk Score and Plan: Treatment may vary due to age or medical condition  Airway Management Planned: Natural Airway  Additional Equipment:   Intra-op Plan:   Post-operative Plan:   Informed Consent: I have reviewed the patients History and Physical, chart, labs and discussed the procedure including the risks, benefits and alternatives for the proposed anesthesia with the patient or authorized representative who has indicated his/her understanding and acceptance.       Plan Discussed with: Anesthesiologist  Anesthesia Plan Comments: (Patient identified. Risks, benefits, options discussed with patient including but not limited to bleeding, infection, nerve damage, paralysis, failed block, incomplete pain control, headache, blood  pressure changes, nausea, vomiting, reactions to medication, itching, and post partum back pain. Confirmed with bedside nurse the patient's most recent platelet count. Confirmed with the patient that they are not taking any anticoagulation, have any bleeding history or any family history of bleeding disorders. Patient expressed understanding and wishes to proceed. All questions were answered. )       Anesthesia Quick Evaluation

## 2022-11-04 NOTE — Progress Notes (Signed)
Late entry  Labor Progress Note  Angela Flynn is a 27 y.o. G3P1011 at [redacted]w[redacted]d presented for A1GDM (EFW 95% at 38.6w)  S: Patient doing well overall.  Had been on Pitocin of 16, was due for SVE and was noted to be 1 cm dilated.  O:  BP (!) 125/48   Pulse 95   Temp 98 F (36.7 C) (Oral)   Resp 19   Ht 5\' 9"  (1.753 m)   Wt (!) 146.7 kg   LMP 02/10/2022 (Approximate)   SpO2 100%   BMI 47.77 kg/m  EFM: 130 bpm/Moderate variability/ 15x15 accels/ None decels CAT: 1 Toco: regular, every 1-5 minutes   CVE: Dilation: 1 Effacement (%): 50 Station: -3 Presentation: Vertex Exam by:: Camelia Phenes, MD   A&P: 27 y.o. [id number] [redacted]w[redacted]d  here for IOL as above  #Labor: Progressing well.  Foley bulb placed, Pitocin dose changed to 6 milliunits/min. #Pain: Nitrous oxide, Family/Friend support, and PO/IV pain meds #FWB: CAT 1 #GBS positive-penicillin # A1 GDM: CBG 89, continue CBG every 4 hours in latent labor, every 2 hours in active labor  Myrtie Hawk, DO FMOB Fellow, Faculty practice [facility], Center for [facility] 11/04/22  3:46 PM

## 2022-11-04 NOTE — Anesthesia Procedure Notes (Addendum)
Epidural Patient location during procedure: OB Start time: 11/04/2022 4:20 PM End time: 11/04/2022 4:30 PM  Staffing Anesthesiologist: Elmer Picker, MD Performed: anesthesiologist   Preanesthetic Checklist Completed: patient identified, IV checked, risks and benefits discussed, monitors and equipment checked, pre-op evaluation and timeout performed  Epidural Patient position: sitting Prep: DuraPrep and site prepped and draped Patient monitoring: continuous pulse ox, blood pressure, heart rate and cardiac monitor Approach: midline Location: L3-L4 Injection technique: LOR air  Needle:  Needle type: Tuohy  Needle gauge: 17 G Needle length: 9 cm Needle insertion depth: 8 cm Catheter type: closed end flexible Catheter size: 19 Gauge Catheter at skin depth: 14 cm Test dose: negative  Assessment Sensory level: T8 Events: blood not aspirated, no cerebrospinal fluid, injection not painful, no injection resistance, no paresthesia and negative IV test  Additional Notes Patient identified. Risks/Benefits/Options discussed with patient including but not limited to bleeding, infection, nerve damage, paralysis, failed block, incomplete pain control, headache, blood pressure changes, nausea, vomiting, reactions to medication both or allergic, itching and postpartum back pain. Confirmed with bedside nurse the patient's most recent platelet count. Confirmed with patient that they are not currently taking any anticoagulation, have any bleeding history or any family history of bleeding disorders. Patient expressed understanding and wished to proceed. All questions were answered. Sterile technique was used throughout the entire procedure. Please see nursing notes for vital signs. Test dose was given through epidural catheter and negative prior to continuing to dose epidural or start infusion. Warning signs of high block given to the patient including shortness of breath, tingling/numbness in hands,  complete motor block, or any concerning symptoms with instructions to call for help. Patient was given instructions on fall risk and not to get out of bed. All questions and concerns addressed with instructions to call with any issues or inadequate analgesia.  Reason for block:procedure for pain

## 2022-11-04 NOTE — Progress Notes (Signed)
Labor Progress Note  Angela Flynn is a 27 y.o. G3P1011 at [redacted]w[redacted]d presented for A1GDM (EFW 95% at 38.6w)  S: No acute concerns.   O:  BP 130/87 (BP Location: Left Arm)   Pulse (!) 105   Temp 98.1 F (36.7 C) (Oral)   Resp 17   Ht 5\' 9"  (1.753 m)   Wt (!) 146.7 kg   LMP 02/10/2022 (Approximate)   SpO2 99%   BMI 47.77 kg/m  EFM: 135 bpm/Moderate variability/ 15x15 accels/ None decels  CVE: Dilation: 4.5 Effacement (%): 70 Station: -2 Presentation: Vertex Exam by:: [name]   A&P: 27 y.o. [id number] [redacted]w[redacted]d  here for IOL as above  #Labor: Progressing well.  S/p AROM, clear. Continue pitocin.  #Pain: Nitrous oxide, Family/Friend support, and PO/IV pain meds #FWB: CAT 1 #GBS positive-penicillin # A1 GDM: CBG 73, continue CBG every 4 hours in latent labor, every 2 hours in active labor  Lavonda Jumbo, DO FMOB Fellow, Faculty practice [facility], Center for [facility] 11/04/22  10:06 PM

## 2022-11-05 ENCOUNTER — Other Ambulatory Visit: Payer: Self-pay

## 2022-11-05 ENCOUNTER — Encounter (HOSPITAL_COMMUNITY): Payer: Self-pay | Admitting: Family Medicine

## 2022-11-05 ENCOUNTER — Encounter (HOSPITAL_COMMUNITY)
Admission: RE | Disposition: A | Discharge status: Home / Self Care | Payer: Self-pay | Source: Home / Self Care | Attending: Family Medicine

## 2022-11-05 DIAGNOSIS — Z3A39 39 weeks gestation of pregnancy: Secondary | ICD-10-CM

## 2022-11-05 DIAGNOSIS — O24420 Gestational diabetes mellitus in childbirth, diet controlled: Secondary | ICD-10-CM

## 2022-11-05 DIAGNOSIS — O99820 Streptococcus B carrier state complicating pregnancy: Secondary | ICD-10-CM

## 2022-11-05 DIAGNOSIS — O4593 Premature separation of placenta, unspecified, third trimester: Secondary | ICD-10-CM

## 2022-11-05 DIAGNOSIS — O34211 Maternal care for low transverse scar from previous cesarean delivery: Secondary | ICD-10-CM

## 2022-11-05 DIAGNOSIS — O1002 Pre-existing essential hypertension complicating childbirth: Secondary | ICD-10-CM

## 2022-11-05 LAB — GLUCOSE, CAPILLARY
Glucose-Capillary: 77 mg/dL (ref 70–99)
Glucose-Capillary: 88 mg/dL (ref 70–99)
Glucose-Capillary: 84 mg/dL (ref 70–99)
Glucose-Capillary: 97 mg/dL (ref 70–99)

## 2022-11-05 SURGERY — Surgical Case
Anesthesia: Epidural

## 2022-11-05 MED ORDER — SODIUM CHLORIDE 0.9 % IR SOLN
Status: DC | PRN
  Administered 2022-11-05 (×2): 1

## 2022-11-05 MED ORDER — SENNOSIDES-DOCUSATE SODIUM 8.6-50 MG PO TABS
2.0000 | ORAL_TABLET | Freq: Every day | ORAL | Status: DC
  Administered 2022-11-06 – 2022-11-07 (×2): 2 via ORAL
  Filled 2022-11-05 (×2): qty 2

## 2022-11-05 MED ORDER — DIBUCAINE (PERIANAL) 1 % EX OINT: 1.0000 | TOPICAL_OINTMENT | CUTANEOUS | Status: DC | PRN

## 2022-11-05 MED ORDER — METOCLOPRAMIDE HCL 5 MG/ML IJ SOLN
INTRAMUSCULAR | Status: AC
  Filled 2022-11-05: qty 2

## 2022-11-05 MED ORDER — SCOPOLAMINE 1 MG/3DAYS TD PT72
1.0000 | MEDICATED_PATCH | Freq: Once | TRANSDERMAL | Status: DC
  Filled 2022-11-05: qty 1

## 2022-11-05 MED ORDER — MORPHINE SULFATE (PF) 0.5 MG/ML IJ SOLN
INTRAMUSCULAR | Status: AC
  Filled 2022-11-05: qty 10

## 2022-11-05 MED ORDER — OXYTOCIN-SODIUM CHLORIDE 30-0.9 UT/500ML-% IV SOLN: 2.5000 [IU]/h | INTRAVENOUS | Status: AC

## 2022-11-05 MED ORDER — METOCLOPRAMIDE HCL 5 MG/ML IJ SOLN
INTRAMUSCULAR | Status: DC | PRN
  Administered 2022-11-05: 10 mg via INTRAVENOUS

## 2022-11-05 MED ORDER — CEFAZOLIN IN SODIUM CHLORIDE 3-0.9 GM/100ML-% IV SOLN
3.0000 g | INTRAVENOUS | Status: DC
  Filled 2022-11-05: qty 100

## 2022-11-05 MED ORDER — ZOLPIDEM TARTRATE 5 MG PO TABS: 5.0000 mg | ORAL_TABLET | Freq: Every evening | ORAL | Status: DC | PRN

## 2022-11-05 MED ORDER — ACETAMINOPHEN 500 MG PO TABS
1000.0000 mg | ORAL_TABLET | Freq: Four times a day (QID) | ORAL | Status: DC
  Administered 2022-11-05 – 2022-11-07 (×6): 1000 mg via ORAL
  Filled 2022-11-05 (×6): qty 2

## 2022-11-05 MED ORDER — DEXAMETHASONE SODIUM PHOSPHATE 10 MG/ML IJ SOLN
INTRAMUSCULAR | Status: AC
  Filled 2022-11-05: qty 1

## 2022-11-05 MED ORDER — TRANEXAMIC ACID-NACL 1000-0.7 MG/100ML-% IV SOLN
INTRAVENOUS | Status: DC | PRN
  Administered 2022-11-05: 1000 mg via INTRAVENOUS

## 2022-11-05 MED ORDER — CEFAZOLIN SODIUM-DEXTROSE 2-4 GM/100ML-% IV SOLN
INTRAVENOUS | Status: AC
  Filled 2022-11-05: qty 100

## 2022-11-05 MED ORDER — PRENATAL MULTIVITAMIN CH
1.0000 | ORAL_TABLET | Freq: Every day | ORAL | Status: DC
  Administered 2022-11-06: 1 via ORAL
  Filled 2022-11-05: qty 1

## 2022-11-05 MED ORDER — MENTHOL 3 MG MT LOZG: 1.0000 | LOZENGE | OROMUCOSAL | Status: DC | PRN

## 2022-11-05 MED ORDER — KETOROLAC TROMETHAMINE 30 MG/ML IJ SOLN: 30.0000 mg | Freq: Four times a day (QID) | INTRAMUSCULAR | Status: AC | PRN

## 2022-11-05 MED ORDER — CEFAZOLIN SODIUM-DEXTROSE 2-3 GM-%(50ML) IV SOLR
INTRAVENOUS | Status: DC | PRN
  Administered 2022-11-05: 3 g via INTRAVENOUS

## 2022-11-05 MED ORDER — ACETAMINOPHEN 500 MG PO TABS: 1000.0000 mg | ORAL_TABLET | Freq: Four times a day (QID) | ORAL | Status: DC

## 2022-11-05 MED ORDER — OXYCODONE HCL 5 MG PO TABS
5.0000 mg | ORAL_TABLET | ORAL | Status: DC | PRN
  Administered 2022-11-06 – 2022-11-07 (×7): 10 mg via ORAL
  Filled 2022-11-05 (×7): qty 2

## 2022-11-05 MED ORDER — COCONUT OIL OIL: 1.0000 | TOPICAL_OIL | Status: DC | PRN

## 2022-11-05 MED ORDER — WITCH HAZEL-GLYCERIN EX PADS: 1.0000 | MEDICATED_PAD | CUTANEOUS | Status: DC | PRN

## 2022-11-05 MED ORDER — FENTANYL CITRATE (PF) 100 MCG/2ML IJ SOLN
INTRAMUSCULAR | Status: AC
  Filled 2022-11-05: qty 2

## 2022-11-05 MED ORDER — SOD CITRATE-CITRIC ACID 500-334 MG/5ML PO SOLN: 30.0000 mL | ORAL | Status: DC

## 2022-11-05 MED ORDER — ENOXAPARIN SODIUM 80 MG/0.8ML IJ SOSY
70.0000 mg | PREFILLED_SYRINGE | INTRAMUSCULAR | Status: DC
  Administered 2022-11-06 – 2022-11-07 (×2): 70 mg via SUBCUTANEOUS
  Filled 2022-11-05 (×2): qty 0.8

## 2022-11-05 MED ORDER — KETOROLAC TROMETHAMINE 30 MG/ML IJ SOLN
INTRAMUSCULAR | Status: AC
  Filled 2022-11-05: qty 1

## 2022-11-05 MED ORDER — IBUPROFEN 600 MG PO TABS
600.0000 mg | ORAL_TABLET | Freq: Four times a day (QID) | ORAL | Status: DC
  Administered 2022-11-07 (×2): 600 mg via ORAL
  Filled 2022-11-05 (×2): qty 1

## 2022-11-05 MED ORDER — ONDANSETRON HCL 4 MG/2ML IJ SOLN
INTRAMUSCULAR | Status: AC
  Filled 2022-11-05: qty 2

## 2022-11-05 MED ORDER — SIMETHICONE 80 MG PO CHEW: 80.0000 mg | CHEWABLE_TABLET | ORAL | Status: DC | PRN

## 2022-11-05 MED ORDER — SODIUM CHLORIDE 0.9% FLUSH: 3.0000 mL | INTRAVENOUS | Status: DC | PRN

## 2022-11-05 MED ORDER — DIPHENHYDRAMINE HCL 25 MG PO CAPS: 25.0000 mg | ORAL_CAPSULE | ORAL | Status: DC | PRN

## 2022-11-05 MED ORDER — TRANEXAMIC ACID-NACL 1000-0.7 MG/100ML-% IV SOLN
INTRAVENOUS | Status: AC
  Filled 2022-11-05: qty 100

## 2022-11-05 MED ORDER — SODIUM CHLORIDE 0.9 % IV SOLN
500.0000 mg | INTRAVENOUS | Status: AC
  Administered 2022-11-05: 500 mg via INTRAVENOUS

## 2022-11-05 MED ORDER — KETOROLAC TROMETHAMINE 30 MG/ML IJ SOLN
30.0000 mg | Freq: Four times a day (QID) | INTRAMUSCULAR | Status: AC | PRN
  Administered 2022-11-05: 30 mg via INTRAVENOUS

## 2022-11-05 MED ORDER — TRANEXAMIC ACID-NACL 1000-0.7 MG/100ML-% IV SOLN: 1000.0000 mg | Freq: Once | INTRAVENOUS | Status: DC

## 2022-11-05 MED ORDER — FENTANYL CITRATE (PF) 100 MCG/2ML IJ SOLN
INTRAMUSCULAR | Status: DC | PRN
  Administered 2022-11-05: 100 ug via INTRAVENOUS

## 2022-11-05 MED ORDER — OXYTOCIN-SODIUM CHLORIDE 30-0.9 UT/500ML-% IV SOLN
INTRAVENOUS | Status: AC
  Filled 2022-11-05: qty 500

## 2022-11-05 MED ORDER — FENTANYL CITRATE (PF) 100 MCG/2ML IJ SOLN: 25.0000 ug | INTRAMUSCULAR | Status: DC | PRN

## 2022-11-05 MED ORDER — SIMETHICONE 80 MG PO CHEW
80.0000 mg | CHEWABLE_TABLET | Freq: Three times a day (TID) | ORAL | Status: DC
  Administered 2022-11-05 – 2022-11-07 (×4): 80 mg via ORAL
  Filled 2022-11-05 (×5): qty 1

## 2022-11-05 MED ORDER — LIDOCAINE-EPINEPHRINE (PF) 2 %-1:200000 IJ SOLN
INTRAMUSCULAR | Status: AC
  Filled 2022-11-05: qty 20

## 2022-11-05 MED ORDER — MORPHINE SULFATE (PF) 0.5 MG/ML IJ SOLN
INTRAMUSCULAR | Status: DC | PRN
  Administered 2022-11-05: 3 mg via EPIDURAL

## 2022-11-05 MED ORDER — NALOXONE HCL 4 MG/10ML IJ SOLN: 1.0000 ug/kg/h | INTRAVENOUS | Status: DC | PRN

## 2022-11-05 MED ORDER — FUROSEMIDE 20 MG PO TABS
20.0000 mg | ORAL_TABLET | Freq: Every day | ORAL | Status: DC
  Administered 2022-11-05 – 2022-11-07 (×3): 20 mg via ORAL
  Filled 2022-11-05 (×3): qty 1

## 2022-11-05 MED ORDER — ONDANSETRON HCL 4 MG/2ML IJ SOLN
4.0000 mg | Freq: Three times a day (TID) | INTRAMUSCULAR | Status: DC | PRN
  Administered 2022-11-06: 4 mg via INTRAVENOUS
  Filled 2022-11-05: qty 2

## 2022-11-05 MED ORDER — DIPHENHYDRAMINE HCL 25 MG PO CAPS: 25.0000 mg | ORAL_CAPSULE | Freq: Four times a day (QID) | ORAL | Status: DC | PRN

## 2022-11-05 MED ORDER — LACTATED RINGERS IV SOLN: INTRAVENOUS | Status: DC | PRN

## 2022-11-05 MED ORDER — OXYTOCIN-SODIUM CHLORIDE 30-0.9 UT/500ML-% IV SOLN
INTRAVENOUS | Status: DC | PRN
  Administered 2022-11-05: 30 mL via INTRAVENOUS

## 2022-11-05 MED ORDER — KETOROLAC TROMETHAMINE 30 MG/ML IJ SOLN
30.0000 mg | Freq: Four times a day (QID) | INTRAMUSCULAR | Status: AC
  Administered 2022-11-05 – 2022-11-06 (×3): 30 mg via INTRAVENOUS
  Filled 2022-11-05 (×4): qty 1

## 2022-11-05 MED ORDER — FENTANYL CITRATE (PF) 100 MCG/2ML IJ SOLN
INTRAMUSCULAR | Status: DC | PRN
  Administered 2022-11-05: 100 ug via EPIDURAL

## 2022-11-05 MED ORDER — PHENYLEPHRINE 80 MCG/ML (10ML) SYRINGE FOR IV PUSH (FOR BLOOD PRESSURE SUPPORT)
PREFILLED_SYRINGE | INTRAVENOUS | Status: DC | PRN
  Administered 2022-11-05 (×2): 40 ug via INTRAVENOUS

## 2022-11-05 MED ORDER — DEXMEDETOMIDINE HCL IN NACL 80 MCG/20ML IV SOLN
INTRAVENOUS | Status: DC | PRN
  Administered 2022-11-05 (×3): 10 ug via INTRAVENOUS

## 2022-11-05 MED ORDER — NALOXONE HCL 0.4 MG/ML IJ SOLN: 0.4000 mg | INTRAMUSCULAR | Status: DC | PRN

## 2022-11-05 MED ORDER — DIPHENHYDRAMINE HCL 50 MG/ML IJ SOLN: 12.5000 mg | INTRAMUSCULAR | Status: DC | PRN

## 2022-11-05 SURGICAL SUPPLY — 43 items
ADH SKN CLS APL DERMABOND .7 (GAUZE/BANDAGES/DRESSINGS) ×1
APL PRP STRL LF DISP 70% ISPRP (MISCELLANEOUS) ×2
APL SKNCLS STERI-STRIP NONHPOA (GAUZE/BANDAGES/DRESSINGS) ×2
BENZOIN TINCTURE PRP APPL 2/3 (GAUZE/BANDAGES/DRESSINGS) ×1 IMPLANT
CANISTER PREVENA PLUS 150 (CANNISTER) IMPLANT
CHLORAPREP W/TINT 26 (MISCELLANEOUS) ×2 IMPLANT
CLAMP UMBILICAL CORD (MISCELLANEOUS) ×1 IMPLANT
CLOTH BEACON ORANGE TIMEOUT ST (SAFETY) ×1 IMPLANT
DERMABOND ADVANCED .7 DNX12 (GAUZE/BANDAGES/DRESSINGS) ×1 IMPLANT
DRESSING PREVENA PLUS CUSTOM (GAUZE/BANDAGES/DRESSINGS) IMPLANT
DRSG OPSITE POSTOP 4X10 (GAUZE/BANDAGES/DRESSINGS) ×1 IMPLANT
DRSG PREVENA PLUS CUSTOM (GAUZE/BANDAGES/DRESSINGS) ×1
ELECT REM PT RETURN 9FT ADLT (ELECTROSURGICAL) ×1
ELECTRODE REM PT RTRN 9FT ADLT (ELECTROSURGICAL) ×1 IMPLANT
EXTRACTOR VACUUM KIWI (MISCELLANEOUS) IMPLANT
GAUZE SPONGE 4X4 12PLY STRL LF (GAUZE/BANDAGES/DRESSINGS) IMPLANT
GLOVE BIOGEL PI IND STRL 7.0 (GLOVE) ×3 IMPLANT
GLOVE ECLIPSE 6.5 STRL STRAW (GLOVE) ×1 IMPLANT
GOWN STRL REUS W/TWL LRG LVL3 (GOWN DISPOSABLE) ×3 IMPLANT
KIT ABG SYR 3ML LUER SLIP (SYRINGE) IMPLANT
MAT PREVALON FULL STRYKER (MISCELLANEOUS) IMPLANT
NDL HYPO 25X5/8 SAFETYGLIDE (NEEDLE) IMPLANT
NEEDLE HYPO 25X5/8 SAFETYGLIDE (NEEDLE) IMPLANT
NS IRRIG 1000ML POUR BTL (IV SOLUTION) ×1 IMPLANT
PACK C SECTION WH (CUSTOM PROCEDURE TRAY) ×1 IMPLANT
PAD ABD 7.5X8 STRL (GAUZE/BANDAGES/DRESSINGS) ×1 IMPLANT
PAD OB MATERNITY 4.3X12.25 (PERSONAL CARE ITEMS) ×1 IMPLANT
RETAINER VISCERAL (MISCELLANEOUS) IMPLANT
RTRCTR C-SECT PINK 25CM LRG (MISCELLANEOUS) ×1 IMPLANT
SUT PLAIN 0 NONE (SUTURE) IMPLANT
SUT PLAIN 2 0 XLH (SUTURE) IMPLANT
SUT VIC AB 0 CT1 27 (SUTURE) ×2
SUT VIC AB 0 CT1 27XBRD ANBCTR (SUTURE) ×2 IMPLANT
SUT VIC AB 0 CTX 36 (SUTURE) ×3
SUT VIC AB 0 CTX36XBRD ANBCTRL (SUTURE) ×3 IMPLANT
SUT VIC AB 2-0 CT1 27 (SUTURE) ×1
SUT VIC AB 2-0 CT1 TAPERPNT 27 (SUTURE) ×1 IMPLANT
SUT VIC AB 3-0 SH 27 (SUTURE) ×1
SUT VIC AB 3-0 SH 27XBRD (SUTURE) ×1 IMPLANT
SUT VIC AB 4-0 KS 27 (SUTURE) ×1 IMPLANT
TOWEL OR 17X24 6PK STRL BLUE (TOWEL DISPOSABLE) ×1 IMPLANT
TRAY FOLEY W/BAG SLVR 14FR LF (SET/KITS/TRAYS/PACK) IMPLANT
WATER STERILE IRR 1000ML POUR (IV SOLUTION) ×1 IMPLANT

## 2022-11-05 NOTE — Anesthesia Postprocedure Evaluation (Signed)
Anesthesia Post Note  Patient: Seniyah Schrupp  Procedure(s) Performed: CESAREAN SECTION     Patient location during evaluation: PACU Anesthesia Type: Epidural Level of consciousness: oriented and awake and alert Pain management: pain level controlled Vital Signs Assessment: post-procedure vital signs reviewed and stable Respiratory status: spontaneous breathing, respiratory function stable and patient connected to nasal cannula oxygen Cardiovascular status: blood pressure returned to baseline and stable Postop Assessment: no headache, no backache, no apparent nausea or vomiting and epidural receding Anesthetic complications: no  No notable events documented.  Last Vitals:  Vitals:   11/05/22 1950 11/05/22 2216  BP: 126/80 136/89  Pulse: 77 64  Resp: 18 18  Temp: 37 C 36.8 C  SpO2: 95% 98%    Last Pain:  Vitals:   11/05/22 2332  TempSrc:   PainSc: 3    Pain Goal:                   Taniqua Issa L Hillis Mcphatter

## 2022-11-05 NOTE — Progress Notes (Signed)
Labor Progress Note  Angela Flynn is a 27 y.o. G3P1011 at [redacted]w[redacted]d presented for A1GDM (EFW 95% at 38.6w)  S: Called to room by RN due to recurrent decels  O:  BP 108/62   Pulse (!) 102   Temp 98.2 F (36.8 C) (Oral)   Resp 17   Ht 5\' 9"  (1.753 m)   Wt (!) 146.7 kg   LMP 02/10/2022 (Approximate)   SpO2 98%   BMI 47.77 kg/m  EFM: 130 bpm/Moderate variability/ 15x15 accels/ Variable, late decels  CVE: Dilation: 5 Effacement (%): 70 Station: -2 Presentation: Vertex Exam by:: [name]  A&P: 27 y.o. [id number] [redacted]w[redacted]d  here for IOL as above  #Labor: Unchanged. Pit turned off. IVF bolus given. Maternal position optimized. Restart pit at 8 once fetal HR shows good recovery for 20-30 mins.  #Pain: Epidural #FWB: Cat II, improving #GBS positive-penicillin # A1 GDM: CBG 77, continue CBG every 4 hours in latent labor, every 2 hours in active labor  Lavonda Jumbo, DO FMOB Fellow, Faculty practice [facility], Center for [facility] 11/05/22  3:48 AM

## 2022-11-05 NOTE — Op Note (Addendum)
Operative Note   Patient: Angela Flynn  Date of Procedure: 11/05/2022  Procedure: Repeat Low Transverse Cesarean   Indications: failure to progress: arrest of dilation at 5 cm, failure to progress: arrest of descent, and previous uterine incision: low transverse  Pre-operative Diagnosis: unscheduled cesarean section, repeat cesarean section, arrest of dilitation, failed induction.   Post-operative Diagnosis: Same  TOLAC Candidate: Yes   Surgeon: Surgeons and Role:    Myna Hidalgo, DO - Primary  Assistants: none  An experienced assistant was required given the standard of surgical care given the complexity of the case.  This assistant was needed for exposure, dissection, suctioning, retraction, instrument exchange, assisting with delivery with administration of fundal pressure, and for overall help during the procedure.   Anesthesia: epidural  Anesthesiologist: Dr. Armond Hang  Antibiotics: Cefazolin and Azithromycin   Estimated Blood Loss: 192 ml   Total IV Fluids: 2600 ml  Urine Output:  400 cc OF clear urine  Specimens: None  Complications: no complications   Indications: Angela Flynn is a 27 y.o. [id number] with an IUP [redacted]w[redacted]d presenting for unscheduled, urgent cesarean secondary to the indications listed above. Clinical course notable for progression to 5 cm.  She had Foley balloon and Pitocin.  No progression past 5 cm.  Episodes of recurrent decelerations which improved with discontinuation of Pit, fluid bolus, position changes.  The risks of cesarean section discussed with the patient included but were not limited to: bleeding which may require transfusion or reoperation; infection which may require antibiotics; injury to bowel, bladder, ureters or other surrounding organs; injury to the fetus; need for additional procedures including hysterectomy in the event of a life-threatening hemorrhage; placental abnormalities with subsequent pregnancies, incisional  problems, thromboembolic phenomenon and other postoperative/anesthesia complications. The patient concurred with the proposed plan, giving informed written consent for the procedure. Patient has been NPO since last night she will remain NPO for procedure. Anesthesia and OR aware. Preoperative prophylactic antibiotics and SCDs ordered on call to the OR.    Findings: Viable infant in cephalic presentation, no nuchal cord present. Apgars 9, 9, . Weight 3450 g. Clear amniotic fluid. Normal placenta, three vessel cord. Normal uterus, Normal bilateral fallopian tubes, Normal bilateral ovaries. Moderate adhesive disease, omental adhesions to the anterior abdominal wall.    Procedure Details: A Time Out was held and the above information confirmed. The patient received intravenous antibiotics and had sequential compression devices applied to her lower extremities preoperatively. The patient was taken back to the operative suite where epidural anesthesia was administered. After induction of anesthesia, the patient was draped and prepped in the usual sterile manner and placed in a dorsal supine position with a leftward tilt. A low transverse skin incision was made with scalpel and carried down through the subcutaneous tissue to the fascia. Fascial incision was made and extended transversely. The fascia was separated from the underlying rectus tissue superiorly and inferiorly. The rectus muscles were separated in the midline bluntly and requiring some use of Mayo scissors.  And the peritoneum was entered bluntly.  There were multiple omental adhesions to the anterior abdominal wall. Two small areas required suture ligation- each area was tied and ligated with the bovie.  Visualization improved and an Alexis retractor was placed to aid in visualization of the uterus. A bladder flap was not developed. A low transverse uterine incision was made. The infant was successfully delivered from cephalic presentation, the umbilical  cord was clamped after 1 minute. Cord ph was not sent,  and cord blood was obtained for evaluation. The placenta was removed Intact and appeared normal. The uterine incision was closed with a single layer running unlocked suture of 0-Vicryl Due to ongoing bleeding from the hysterotomy figure of eight sutures of 0 vircyl were placed after which there was excellent hemostasis. The abdomen and the pelvis were cleared of all clot and debris and the Jon Gills was removed. Hemostasis was confirmed on all surfaces.  The peritoneum was not reapproximated due to omental adhesions.  The fascia was then closed using 0 Vicryl in a running fashion. The subcutaneous layer was reapproximated with 2-0 plain gut suture. The skin was closed with a 4-0 vicryl subcuticular stitch. The patient tolerated the procedure well. Sponge, lap, instrument and needle counts were correct x 2. She was taken to the recovery room in stable condition. Provena dressing placed.  Disposition: PACU - hemodynamically stable.    Signed: Celedonio Savage, MD [facility] Fellow  Center for [facility], [facility]

## 2022-11-05 NOTE — Progress Notes (Addendum)
Labor Progress Note  Angela Flynn is a 26 y.o. G3P1011 at [redacted]w[redacted]d presented for A1GDM (EFW 95% at 38.6w)  S: No acute concerns.   O:  BP 120/67   Pulse 97   Temp 98.2 F (36.8 C) (Oral)   Resp 17   Ht 5\' 9"  (1.753 m)   Wt (!) 146.7 kg   LMP 02/10/2022 (Approximate)   SpO2 99%   BMI 47.77 kg/m  EFM: 125 bpm/Moderate variability/ 15x15 accels/ None decels  CVE: Dilation: 5 Effacement (%): 70 Station: -2 Presentation: Vertex Exam by:: Salvadore Dom DO   A&P: 27 y.o. [id number] [redacted]w[redacted]d  here for IOL as above  #Labor:Unable to assess contractions. IUPC placed. Continue pitocin.  #Pain: Nitrous oxide, Family/Friend support, and PO/IV pain meds #FWB: CAT 1 #GBS positive-penicillin # A1 GDM: CBG 107, continue CBG every 4 hours in latent labor, every 2 hours in active labor  Lavonda Jumbo, DO FMOB Fellow, Faculty practice [facility], Center for [facility] 11/05/22  2:00 AM

## 2022-11-05 NOTE — Transfer of Care (Signed)
Immediate Anesthesia Transfer of Care Note  Patient: Angela Flynn  Procedure(s) Performed: CESAREAN SECTION  Patient Location: PACU  Anesthesia Type:Epidural  Level of Consciousness: awake, alert , and oriented  Airway & Oxygen Therapy: Patient Spontanous Breathing  Post-op Assessment: Report given to RN and Post -op Vital signs reviewed and stable  Post vital signs: Reviewed and stable  Last Vitals:  Vitals Value Taken Time  BP    Temp    Pulse 100 11/05/22 1725  Resp 24 11/05/22 1725  SpO2 91 % 11/05/22 1725  Vitals shown include unfiled device data.  Last Pain:  Vitals:   11/05/22 1500  TempSrc:   PainSc: 0-No pain         Complications: No notable events documented.

## 2022-11-05 NOTE — Progress Notes (Signed)
Labor Progress Note  Angela Flynn is a 27 y.o. G3P1011 at [redacted]w[redacted]d presented for A1GDM (EFW 95% at 38.6w)  S: Patient comfortable with epidural in place.  O:  BP (!) 105/55   Pulse 82   Temp 98.6 F (37 C) (Oral)   Resp 18   Ht 5\' 9"  (1.753 m)   Wt (!) 146.7 kg   LMP 02/10/2022 (Approximate)   SpO2 98%   BMI 47.77 kg/m  EFM: 130 bpm/Moderate variability/ 15x15 accels/ Variable, late decels  CVE: Dilation: 5 Effacement (%): 70 Station: -2 Presentation: Vertex Exam by:: Jams Trickett, MD  A&P: 27 y.o. L2G4010 [redacted]w[redacted]d  here for IOL as above  #Labor: Unchanged since prior evaluation.  Patient has been having strong contractions for the past 2 hours.  Fetal heart rate showing occasional late decelerations which improved with position change and fluid bolus.  Discussed repeat cesarean delivery with patient and patient agreeable to cesarean delivery.  The risks of cesarean section discussed with the patient included but were not limited to: bleeding which may require transfusion or reoperation; infection which may require antibiotics; injury to bowel, bladder, ureters or other surrounding organs; injury to the fetus; need for additional procedures including hysterectomy in the event of a life-threatening hemorrhage; placental abnormalities with subsequent pregnancies, incisional problems, thromboembolic phenomenon and other postoperative/anesthesia complications. The patient concurred with the proposed plan, giving informed written consent for the procedure. Patient NPO status waived given urgency of case. Anesthesia and OR aware. Preoperative prophylactic antibiotics and SCDs ordered on call to the OR.   #Pain: Epidural #FWB: Cat II, improving #GBS positive-penicillin # A1 GDM: Continue monitoring  Celedonio Savage, MD FMOB Fellow, Faculty practice [facility], Center for [facility] 11/05/22  2:01 PM

## 2022-11-05 NOTE — Progress Notes (Signed)
At bedside to review management- discussed plan for repeat C-section due to arrest of dilation and failed induction.  Alternative options were reviewed. Risk benefits and alternatives of cesarean section were discussed with the patient including but not limited to infection, bleeding, damage to bowel , bladder and baby with the need for further surgery. Pt voiced understanding and desires to proceed. OR notified.  Myna Hidalgo, DO Attending Obstetrician & Gynecologist, [facility] for [employer], [facility]

## 2022-11-05 NOTE — Discharge Summary (Signed)
Postpartum Discharge Summary  Date of Service updated***     Patient Name: Angela Flynn DOB: [birth year] MRN: [id number]  Date of admission: 11/04/2022 Delivery date:11/05/2022 Delivering provider: Myna Hidalgo Date of discharge: 11/05/2022  Admitting diagnosis: GDM, class A1 [O24.410] Intrauterine pregnancy: [redacted]w[redacted]d     Secondary diagnosis:  Principal Problem:   GDM, class A1 Active Problems:   Herpes genitalis   Hx of preeclampsia, prior pregnancy, currently pregnant   Obesity affecting pregnancy   Supervision of high risk pregnancy, antepartum   History of C-section   Gestational diabetes   GBS bacteriuria   BMI 45.0-49.9, adult (HCC)   DVT (deep vein thrombosis) in pregnancy   Hypertension   Cesarean delivery delivered  Additional problems: ***    Discharge diagnosis: Term Pregnancy Delivered and CHTN                                              Post partum procedures:{Postpartum procedures:23558} Augmentation: AROM, Pitocin, and IP Foley Complications: Placental Abruption  Hospital course: Induction of Labor With Cesarean Section   27 y.o. yo [id number] at [redacted]w[redacted]d was admitted to the hospital 11/04/2022 for induction of labor. Patient had a labor course significant for progression to 5 cm with arrest of dilation and descent. The patient went for cesarean section due to Arrest of Dilation and Arrest of Descent. Delivery details are as follows: Membrane Rupture Time/Date: 9:32 PM,11/04/2022  Delivery Method:C-Section, Low Transverse Details of operation can be found in separate operative Note.  Patient had a postpartum course complicated by***. She is ambulating, tolerating a regular diet, passing flatus, and urinating well.  Patient is discharged home in stable condition on 11/05/22.      Newborn Data: Birth date:11/05/2022 Birth time:4:15 PM Gender:Female Living status:Living Apgars:9 ,9  Weight:3450 g                               Magnesium Sulfate received:  No BMZ received: No Rhophylac:{Rhophylac received:30440032} MMR:N/A T-DaP:{Tdap:23962} Flu: N/A Transfusion:{Transfusion received:30440034}  Physical exam  Vitals:   11/05/22 1726 11/05/22 1729 11/05/22 1730 11/05/22 1745  BP:   121/63 126/77  Pulse: 95 94 97 88  Resp: (!) 23 18 17  (!) 27  Temp:  98.5 F (36.9 C)    TempSrc:  Oral    SpO2: 94% 95% 94% 95%  Weight:      Height:       General: {Exam; general:21111117} Lochia: {Desc; appropriate/inappropriate:30686::"appropriate"} Uterine Fundus: {Desc; firm/soft:30687} Incision: {Exam; incision:21111123} DVT Evaluation: {Exam; dvt:2111122} Labs: Lab Results  Component Value Date   WBC 9.9 11/04/2022   HGB 11.1 (L) 11/04/2022   HCT 33.2 (L) 11/04/2022   MCV 83.8 11/04/2022   PLT 304 11/04/2022      Latest Ref Rng & Units 11/04/2022    1:58 AM  CMP  Glucose 70 - 99 mg/dL 147   BUN 6 - 20 mg/dL 10   Creatinine 8.29 - 1.00 mg/dL 5.62   Sodium 130 - 865 mmol/L 137   Potassium 3.5 - 5.1 mmol/L 3.3   Chloride 98 - 111 mmol/L 106   CO2 22 - 32 mmol/L 22   Calcium 8.9 - 10.3 mg/dL 9.2   Total Protein 6.5 - 8.1 g/dL 6.7   Total Bilirubin 0.3 - 1.2 mg/dL 0.5  Alkaline Phos 38 - 126 U/L 126   AST 15 - 41 U/L 13   ALT 0 - 44 U/L 10    Edinburgh Score:    04/01/2022    8:28 AM  Edinburgh Postnatal Depression Scale Screening Tool  I have been able to laugh and see the funny side of things. 3  I have looked forward with enjoyment to things. --  I have blamed myself unnecessarily when things went wrong. 0  I have been anxious or worried for no good reason. 0  I have felt scared or panicky for no good reason. 0  Things have been getting on top of me. 3  I have been so unhappy that I have had difficulty sleeping. 0  I have felt sad or miserable. 0  I have been so unhappy that I have been crying. 0  The thought of harming myself has occurred to me. 0     After visit meds:  Allergies as of 11/05/2022       Reactions    Tape    Blisters from tape used after c-section   Silicone Rash   Blisters from tape used after c-section     Med Rec must be completed prior to using this [facility]***        Discharge home in stable condition Infant Feeding: {Baby feeding:23562} Infant Disposition:{CHL IP OB HOME WITH WGNFAO:13086} Discharge instruction: per After Visit Summary and Postpartum booklet. Activity: Advance as tolerated. Pelvic rest for 6 weeks.  Diet: {OB VHQI:69629528} Future Appointments:No future appointments. Follow up Visit: Message sent   Please schedule this patient for a In person postpartum visit in 4 weeks with the following provider: Any provider. Additional Postpartum F/U:2 hour GTT and Incision check 1 week  High risk pregnancy complicated by: GDM Delivery mode:  C-Section, Low Transverse Anticipated Birth Control:   declines   11/05/2022 Celedonio Savage, MD

## 2022-11-06 ENCOUNTER — Encounter (HOSPITAL_COMMUNITY): Payer: Self-pay | Admitting: Obstetrics & Gynecology

## 2022-11-06 LAB — CBC
HCT: 32 % — ABNORMAL LOW (ref 36.0–46.0)
Hemoglobin: 10.4 g/dL — ABNORMAL LOW (ref 12.0–15.0)
MCH: 28.2 pg (ref 26.0–34.0)
MCHC: 32.5 g/dL (ref 30.0–36.0)
MCV: 86.7 fL (ref 80.0–100.0)
Platelets: 292 10*3/uL (ref 150–400)
RBC: 3.69 MIL/uL — ABNORMAL LOW (ref 3.87–5.11)
RDW: 14 % (ref 11.5–15.5)
WBC: 17.4 10*3/uL — ABNORMAL HIGH (ref 4.0–10.5)
nRBC: 0 % (ref 0.0–0.2)

## 2022-11-06 NOTE — Progress Notes (Addendum)
POSTPARTUM PROGRESS NOTE  Subjective: Angela Flynn is a 27 y.o. [id number] s/p LTCS at [redacted]w[redacted]d.  She reports she is doing well. No acute events overnight. She denies any problems with ambulating, voiding or po intake. Denies nausea or vomiting. Pain is well controlled.  Lochia is appropriate. Pt denies calf pain, notes some swelling to bilateral feet.   Objective: Blood pressure 136/89, pulse 64, temperature 98.3 F (36.8 C), temperature source Oral, resp. rate 20, height 5\' 9"  (1.753 m), weight (!) 146.7 kg, last menstrual period 02/10/2022, SpO2 97%, unknown if currently breastfeeding.  Physical Exam:  General: alert, cooperative and no distress Chest: no respiratory distress Abdomen: soft, non-tender  Uterine Fundus: firm and at level of umbilicus Extremities: No calf swelling or tenderness  no calf erythema  minimal nonpitting edema bilateral ankles and feet Incision: C/D/I with dressing, wound vac in place  Recent Labs    11/04/22 1548 11/06/22 0523  HGB 11.1* 10.4*  HCT 33.2* 32.0*    Assessment/Plan: Angela Flynn is a 27 y.o. O9G2952 s/p LTCS at [redacted]w[redacted]d for arrest of descent. POD#1  Routine Postpartum Care: Doing well, pain well-controlled.  -- Continue routine care, lactation support  -- Contraception: declines -- Feeding: both -- A1GDM: CBGs WNL, most recent 3 -- DVT prior pregnancy: lovenox post-op, no sign of dvt on exam -- cHTN: lasix 20, last BP 136/89, CTM  Dispo: continue routine postop care  Para March, DO Faculty Practice, Center for [facility] 11/06/2022 8:32 AM  Fellow Attestation  I saw and evaluated the patient, performing the key elements of the service.I  personally performed or re-performed the history, physical exam, and medical decision making activities of this service and have verified that the service and findings are accurately documented in the resident's note. I developed the management plan that is described in the  resident's note, and I agree with the content, with my edits above.    Derrel Nip, MD OB Fellow

## 2022-11-06 NOTE — Lactation Note (Signed)
This note was copied from a baby's chart. Lactation Consultation Note  Patient Name: Angela Flynn RUEAV'W Date: 11/06/2022 Age:29 hours  Attempted to see mom. She was awake but asked if Lactation could come see her tomorrow (later today). Mom stated she hadn't put the baby to the breast but once right after delivery. Mom stated she doesn't have anything right now. Plans to give formula until her milk comes in but would like for Lactation to come by later and see her to give her some knowledge about BF.    Maternal Data    Feeding    LATCH Score                    Lactation Tools Discussed/Used    Interventions    Discharge    Consult Status      Angela Flynn 11/06/2022, 12:33 AM

## 2022-11-06 NOTE — Lactation Note (Signed)
This note was copied from a baby's chart. Lactation Consultation Note  Patient Name: Boy Ambermarie Seery VZDGL'O Date: 11/06/2022 Age:29 hours  Reason for consult: Initial assessment;Term;Maternal endocrine disorder  P2, [redacted]w[redacted]d, 1% weight loss, GDM  Mother breast fed after delivery and since has been formula feeding. She says she doesn't think she has any milk yet and does want him to go without food. By choice, mother has been feeding baby15-20 mL of formula per bottle.   Mother reports that with her first child, her milk came in prior to delivery but dried up and she was not able to breast feed. She said she has not seen colostrum with this pregnancy and she attempted to pump prior to delivery but did not express colostrum.  Mother had recently formula fed baby and he is skin to skin with mom. Mother was receptive to learning how to do hand expression. Glistening of colostrum expressed and mother said she "felt more inspired and confident to try to latch baby".   Encouraged to latch baby with feeding cues, place baby skin to skin if not latching, and call for assistance with breastfeeding as needed. Anticipate as baby approaches 24-48 hours of age, baby will feed more often (8-12 plus times in 24 hours) at breast and "cluster feed".    Mom made aware of O/P services, breastfeeding support groups, community resources, and our phone # for post-discharge questions.     Maternal Data Has patient been taught Hand Expression?: Yes Does the patient have breastfeeding experience prior to this delivery?: No (formula fed her last child)  Feeding Mother's Current Feeding Choice: Breast Milk and Formula Nipple Type: Slow - flow   Interventions  Education  Discharge Pump: DEBP (Motif)  Consult Status Consult Status: Follow-up Date: 11/07/22 Follow-up type: In-patient    Christella Hartigan M 11/06/2022, 6:41 PM

## 2022-11-07 ENCOUNTER — Other Ambulatory Visit (HOSPITAL_COMMUNITY): Payer: Self-pay

## 2022-11-07 MED ORDER — FUROSEMIDE 20 MG PO TABS
20.0000 mg | ORAL_TABLET | Freq: Every day | ORAL | 0 refills | Status: DC
  Filled 2022-11-07: qty 2, 2d supply, fill #0

## 2022-11-07 MED ORDER — SENNOSIDES-DOCUSATE SODIUM 8.6-50 MG PO TABS
2.0000 | ORAL_TABLET | Freq: Every day | ORAL | 0 refills | Status: DC
  Filled 2022-11-07: qty 30, 15d supply, fill #0

## 2022-11-07 MED ORDER — BLOOD PRESSURE MONITOR MISC
0 refills | Status: AC
  Filled 2022-11-07: qty 1, 30d supply, fill #0

## 2022-11-07 MED ORDER — IBUPROFEN 600 MG PO TABS
600.0000 mg | ORAL_TABLET | Freq: Four times a day (QID) | ORAL | 0 refills | Status: AC
  Filled 2022-11-07: qty 30, 8d supply, fill #0

## 2022-11-07 MED ORDER — FUROSEMIDE 20 MG PO TABS
20.0000 mg | ORAL_TABLET | Freq: Every day | ORAL | 0 refills | Status: DC
  Filled 2022-11-07: qty 30, 30d supply, fill #0

## 2022-11-07 MED ORDER — ACETAMINOPHEN 500 MG PO TABS
1000.0000 mg | ORAL_TABLET | Freq: Four times a day (QID) | ORAL | 0 refills | Status: AC
  Filled 2022-11-07: qty 100, 13d supply, fill #0

## 2022-11-07 MED ORDER — NIFEDIPINE ER 30 MG PO TB24
30.0000 mg | ORAL_TABLET | Freq: Every day | ORAL | 0 refills | Status: DC
  Filled 2022-11-07: qty 30, 30d supply, fill #0

## 2022-11-07 MED ORDER — OXYCODONE HCL 5 MG PO TABS
5.0000 mg | ORAL_TABLET | Freq: Four times a day (QID) | ORAL | 0 refills | Status: DC | PRN
  Filled 2022-11-07: qty 19, 5d supply, fill #0

## 2022-11-07 MED ORDER — NIFEDIPINE ER OSMOTIC RELEASE 30 MG PO TB24
30.0000 mg | ORAL_TABLET | Freq: Every day | ORAL | Status: DC
  Administered 2022-11-07: 30 mg via ORAL
  Filled 2022-11-07: qty 1

## 2022-11-11 ENCOUNTER — Ambulatory Visit: Payer: Medicaid Other

## 2022-11-14 ENCOUNTER — Encounter (HOSPITAL_COMMUNITY): Payer: Self-pay | Admitting: Obstetrics and Gynecology

## 2022-11-14 ENCOUNTER — Inpatient Hospital Stay (HOSPITAL_COMMUNITY)
Admission: AD | Admit: 2022-11-14 | Discharge: 2022-11-14 | Disposition: A | Discharge status: Home / Self Care | Payer: Medicaid Other | Attending: Obstetrics and Gynecology | Admitting: Obstetrics and Gynecology

## 2022-11-14 DIAGNOSIS — R609 Edema, unspecified: Secondary | ICD-10-CM | POA: Insufficient documentation

## 2022-11-14 DIAGNOSIS — O9089 Other complications of the puerperium, not elsewhere classified: Secondary | ICD-10-CM | POA: Insufficient documentation

## 2022-11-14 DIAGNOSIS — Z3A41 41 weeks gestation of pregnancy: Secondary | ICD-10-CM

## 2022-11-14 DIAGNOSIS — O165 Unspecified maternal hypertension, complicating the puerperium: Secondary | ICD-10-CM | POA: Insufficient documentation

## 2022-11-14 DIAGNOSIS — G8918 Other acute postprocedural pain: Secondary | ICD-10-CM | POA: Insufficient documentation

## 2022-11-14 DIAGNOSIS — O1205 Gestational edema, complicating the puerperium: Secondary | ICD-10-CM

## 2022-11-14 LAB — COMPREHENSIVE METABOLIC PANEL
ALT: 10 U/L (ref 0–44)
AST: 11 U/L — ABNORMAL LOW (ref 15–41)
Albumin: 2.9 g/dL — ABNORMAL LOW (ref 3.5–5.0)
Alkaline Phosphatase: 92 U/L (ref 38–126)
Anion gap: 9 (ref 5–15)
BUN: 12 mg/dL (ref 6–20)
CO2: 23 mmol/L (ref 22–32)
Calcium: 8.7 mg/dL — ABNORMAL LOW (ref 8.9–10.3)
Chloride: 101 mmol/L (ref 98–111)
Creatinine, Ser: 0.59 mg/dL (ref 0.44–1.00)
GFR, Estimated: >60 mL/min (ref >60)
Glucose, Bld: 101 mg/dL — ABNORMAL HIGH (ref 70–99)
Potassium: 3.5 mmol/L (ref 3.5–5.1)
Sodium: 133 mmol/L — ABNORMAL LOW (ref 135–145)
Total Bilirubin: 0.4 mg/dL (ref 0.3–1.2)
Total Protein: 6.6 g/dL (ref 6.5–8.1)

## 2022-11-14 LAB — CBC
HCT: 32.9 % — ABNORMAL LOW (ref 36.0–46.0)
Hemoglobin: 10.4 g/dL — ABNORMAL LOW (ref 12.0–15.0)
MCH: 27.2 pg (ref 26.0–34.0)
MCHC: 31.6 g/dL (ref 30.0–36.0)
MCV: 85.9 fL (ref 80.0–100.0)
Platelets: 441 10*3/uL — ABNORMAL HIGH (ref 150–400)
RBC: 3.83 MIL/uL — ABNORMAL LOW (ref 3.87–5.11)
RDW: 13.7 % (ref 11.5–15.5)
WBC: 9.2 10*3/uL (ref 4.0–10.5)
nRBC: 0 % (ref 0.0–0.2)

## 2022-11-14 MED ORDER — OXYCODONE HCL 5 MG PO TABS: 5.0000 mg | ORAL_TABLET | ORAL | 0 refills | Status: AC | PRN

## 2022-11-14 MED ORDER — OXYCODONE HCL 5 MG PO TABS
10.0000 mg | ORAL_TABLET | Freq: Once | ORAL | Status: AC
  Administered 2022-11-14: 10 mg via ORAL
  Filled 2022-11-14: qty 2

## 2022-11-14 MED ORDER — FUROSEMIDE 20 MG PO TABS: 20.0000 mg | ORAL_TABLET | Freq: Every day | ORAL | 0 refills | Status: AC

## 2022-11-14 MED ORDER — NIFEDIPINE 10 MG PO CAPS
10.0000 mg | ORAL_CAPSULE | Freq: Once | ORAL | Status: AC
  Administered 2022-11-14: 10 mg via ORAL
  Filled 2022-11-14: qty 1

## 2022-11-14 MED ORDER — NIFEDIPINE ER 30 MG PO TB24: 30.0000 mg | ORAL_TABLET | Freq: Two times a day (BID) | ORAL | 0 refills | Status: AC

## 2022-11-14 NOTE — MAU Provider Note (Signed)
Chief Complaint:  Headache and Hypertension  HPI   Event Date/Time   First Provider Initiated Contact with Patient 11/14/22 0313     Angela Flynn is a 27 y.o. [id number] at on POD#7 who presents to maternity admissions reporting elevated BP, headache, lower extremity edema, and increased incisional/post-op pain. Has been trying to stretch out her last oxycodone doses, last one was at 0030 last night (over 24hrs ago), she's been taking only Tylenol/motrin today without good pain relief. This morning she took her tylenol/motrin/BP meds, magnesium and potassium with good relief of her headache but it is back (mild) now. Suspects it is related to feeling tense with the poor pain control. Has on Prevena, which should be removed tmrw 8/6. Is here with her newborn and strongly prefers to go home as she has the baby and a 3yo to take care of.   Also notes a raised area on her left leg.   Pregnancy Course: Received prenatal care at [facility], prenatal records reviewed.  Past Medical History:  Diagnosis Date   Allergy    seasonal, dusts   Complication of anesthesia    Felt like she couldn't breathe with mask on after 1 procedure   DVT (deep vein thrombosis) in pregnancy    Gestational diabetes    Headache    2-3 per week   Herpes genitalis    Hypertension 11/04/2022   review of BPs show ^ BPs since 2022   MRSA infection    abscesses - history of   Pregnancy induced hypertension    OB History  Gravida Para Term Preterm AB Living  3 2 2   1 2   SAB IAB Ectopic Multiple Live Births  1 0 0 0 2    # Outcome Date GA Lbr Len/2nd Weight Sex Type Anes PTL Lv  3 Term 11/05/22 [redacted]w[redacted]d  7 lb 9.7 oz (3.45 kg) M CS-LTranv EPI  LIV  2 Term 03/23/19 [redacted]w[redacted]d   F CS-LTranv   LIV     Birth Comments: c/s due to heart problems for mom and baby  1 SAB 2014           Past Surgical History:  Procedure Laterality Date   CESAREAN SECTION     CESAREAN SECTION N/A 11/05/2022   Procedure: CESAREAN SECTION;   Surgeon: Myna Hidalgo, DO;  Location: [facility] LD ORS;  Service: Obstetrics;  Laterality: N/A;   CHOLECYSTECTOMY, LAPAROSCOPIC     OPEN REDUCTION INTERNAL FIXATION (ORIF) PROXIMAL PHALANX Left 02/04/2022   Procedure: Open reduction and internal fixation of left fifth digit proximal phalanx fracture;  Surgeon: Kennedy Bucker, MD;  Location[facility];  Service: Orthopedics;  Laterality: Left;   TONSILLECTOMY     Family History  Problem Relation Age of Onset   Bipolar disorder Mother    Heart disease Mother    Heart disease Sister    Hypertension Sister    Diabetes Maternal Grandmother    Hypertension Maternal Grandmother    Social History   Tobacco Use   Smoking status: Never   Smokeless tobacco: Never   Tobacco comments:    Black and mild occasionally   Vaping Use   Vaping status: Never Used  Substance Use Topics   Alcohol use: Not Currently   Drug use: Not Currently    Types: Marijuana    Comment: last use November 2023   Allergies  Allergen Reactions   Tape     Blisters from tape used after c-section   Silicone Rash  Blisters from tape used after c-section   No medications prior to admission.   I have reviewed patient's Past Medical Hx, Surgical Hx, Family Hx, Social Hx, medications and allergies.   ROS  Pertinent items noted in HPI and remainder of comprehensive ROS otherwise negative.   PHYSICAL EXAM  Patient Vitals for the past 24 hrs:  BP Temp Pulse Resp SpO2  11/14/22 0532 (!) 155/77 -- -- -- --  11/14/22 0430 (!) 153/91 -- 80 -- --  11/14/22 0427 (!) 147/83 -- -- -- --  11/14/22 0415 (!) 147/83 -- 75 -- 99 %  11/14/22 0400 (!) 148/94 -- 78 -- 99 %  11/14/22 0345 (!) 147/81 -- 74 -- 98 %  11/14/22 0330 (!) 143/87 -- 76 -- 99 %  11/14/22 0315 138/84 -- 73 -- 98 %  11/14/22 0252 (!) 156/95 98.7 F (37.1 C) 79 20 99 %   Constitutional: Well-developed, well-nourished female in no acute distress.  Cardiovascular: normal rate & rhythm, warm and  well-perfused Respiratory: normal effort, no problems with respiration noted GI: Abd soft, tender to palpation over incisional site, non-distended MS: Extremities nontender, no edema, normal ROM Neurologic: Alert and oriented x 4.  GU: no CVA tenderness Pelvic: exam deferred Extremities: raised area similar to dermatographic urticaria noted on outside of left knee. +2 pitting edema to lower extremities as well as generalized puffiness to hands and wrists.  Labs: Results for orders placed or performed during the hospital encounter of 11/14/22 (from the past 24 hour(s))  CBC     Status: Abnormal   Collection Time: 11/14/22  4:01 AM  Result Value Ref Range   WBC 9.2 4.0 - 10.5 K/uL   RBC 3.83 (L) 3.87 - 5.11 MIL/uL   Hemoglobin 10.4 (L) 12.0 - 15.0 g/dL   HCT 16.1 (L) 09.6 - 04.5 %   MCV 85.9 80.0 - 100.0 fL   MCH 27.2 26.0 - 34.0 pg   MCHC 31.6 30.0 - 36.0 g/dL   RDW 40.9 81.1 - 91.4 %   Platelets 441 (H) 150 - 400 K/uL   nRBC 0.0 0.0 - 0.2 %  Comprehensive metabolic panel     Status: Abnormal   Collection Time: 11/14/22  4:01 AM  Result Value Ref Range   Sodium 133 (L) 135 - 145 mmol/L   Potassium 3.5 3.5 - 5.1 mmol/L   Chloride 101 98 - 111 mmol/L   CO2 23 22 - 32 mmol/L   Glucose, Bld 101 (H) 70 - 99 mg/dL   BUN 12 6 - 20 mg/dL   Creatinine, Ser 7.82 0.44 - 1.00 mg/dL   Calcium 8.7 (L) 8.9 - 10.3 mg/dL   Total Protein 6.6 6.5 - 8.1 g/dL   Albumin 2.9 (L) 3.5 - 5.0 g/dL   AST 11 (L) 15 - 41 U/L   ALT 10 0 - 44 U/L   Alkaline Phosphatase 92 38 - 126 U/L   Total Bilirubin 0.4 0.3 - 1.2 mg/dL   GFR, Estimated >95 >62 mL/min   Anion gap 9 5 - 15   Imaging:  No results found.  MDM & MAU COURSE  MDM: High  MAU Course: Orders Placed This Encounter  Procedures   CBC   Comprehensive metabolic panel   Discharge patient   Meds ordered this encounter  Medications   oxyCODONE (Oxy IR/ROXICODONE) immediate release tablet 10 mg   NIFEdipine (PROCARDIA) capsule 10 mg    furosemide (LASIX) 20 MG tablet    Sig: Take 1  tablet (20 mg total) by mouth daily for 5 days.    Dispense:  5 tablet    Refill:  0   NIFEdipine (ADALAT CC) 30 MG 24 hr tablet    Sig: Take 1 tablet (30 mg total) by mouth 2 (two) times daily at 8 am and 10 pm. Take with breakfast and before bed    Dispense:  30 tablet    Refill:  0   oxyCODONE (ROXICODONE) 5 MG immediate release tablet    Sig: Take 1-2 tablets (5-10 mg total) by mouth every 4 (four) hours as needed for up to 7 days for severe pain.    Dispense:  20 tablet    Refill:  0   Pt endorses a previous injury to that left lateral edge of the knee and itching, but not consistent with superficial thrombophlebitis. Requested Dr. Salvadore Dom to bedside who agreed it was not concerning for a clot.  BP elevated but not severe range, all pain relieved with 10mg  oxycodone (could not give Toradol as last ibuprofen dose 3hrs prior to MAU visit). Gave a dose of 10mg  immediate release procardia and changed meds to BID. Also extended course of lasix to help with swelling and BP control, as well as oxycodone until she gets the Prevena removed at [facility] on the 6th. Pt expressed gratitude and asked for her PP visit to be on my schedule. Also requested to do her 2hr GTT at her pp appt, not at her 1wk incision/BP check. Appt appropriately rescheduled.  ASSESSMENT   1. Postpartum hypertension   2. Postpartum edema   3. Post-operative pain    PLAN  Discharge home in stable condition with postpartum preeclampsia precautions.     Follow-up Information     Center for [employer] [facility] at [facility]. Go in 1 day(s).   Specialty: Obstetrics and Gynecology Why: for BP and incision check only, Sept 4 at 8:20am and 9:15am for postpartum and 2hr GTT with Edd Arbour, CNM Contact information: [address] [place] [address] [phone]                Allergies as of 11/14/2022       Reactions   Tape     Blisters from tape used after c-section   Silicone Rash   Blisters from tape used after c-section        Medication List     TAKE these medications    Acetaminophen Extra Strength 500 MG Tabs Take 2 tablets (1,000 mg total) by mouth every 6 (six) hours then as directed therafter   Blood Pressure Monitor Misc Use as directed for monitoring blood pressure   furosemide 20 MG tablet Commonly known as: LASIX Take 1 tablet (20 mg total) by mouth daily for 5 days.   ibuprofen 600 MG tablet Commonly known as: ADVIL Take 1 tablet (600 mg total) by mouth every 6 (six) hours.   NIFEdipine 30 MG 24 hr tablet Commonly known as: ADALAT CC Take 1 tablet (30 mg total) by mouth 2 (two) times daily at 8 am and 10 pm. Take with breakfast and before bed What changed:  when to take this additional instructions   oxyCODONE 5 MG immediate release tablet Commonly known as: Roxicodone Take 1-2 tablets (5-10 mg total) by mouth every 4 (four) hours as needed for up to 7 days for severe pain. What changed:  how much to take when to take this reasons to take this   Senexon-S 8.6-50  MG tablet Generic drug: senna-docusate Take 2 tablets by mouth daily.       Edd Arbour, CNM, MSN, IBCLC Certified Nurse Midwife, [facility]

## 2022-11-14 NOTE — MAU Note (Signed)
.  Angela Flynn is a 27 y.o. at [facility] C/S here in MAU reporting: elevated bp yesterday, started using a new cuff from office around 1200, bp in ranges of 140-180/90s. Pt reports HA, swelling legs, and a newly onset mark of her left leg that is raised. Pt states she took her prescribed medications today, Tylenol, Motrin. And oxy last at 0030, bp medication yesterday am, and magnesium and potassium yesterday with relief to HA. Pt denies other PIH s/s.   Onset of complaint: yesterday 1200 Pain score: 3/10 Vitals:   11/14/22 0252  BP: (!) 156/95  Pulse: 79  Resp: 20  Temp: 98.7 F (37.1 C)  SpO2: 99%      Lab orders placed from triage:

## 2022-11-15 ENCOUNTER — Ambulatory Visit: Payer: Medicaid Other

## 2022-11-15 ENCOUNTER — Telehealth (INDEPENDENT_AMBULATORY_CARE_PROVIDER_SITE_OTHER): Payer: Medicaid Other

## 2022-11-15 ENCOUNTER — Ambulatory Visit (INDEPENDENT_AMBULATORY_CARE_PROVIDER_SITE_OTHER): Payer: Medicaid Other | Admitting: General Practice

## 2022-11-15 ENCOUNTER — Other Ambulatory Visit: Payer: Medicaid Other

## 2022-11-15 ENCOUNTER — Other Ambulatory Visit: Payer: Self-pay

## 2022-11-15 VITALS — BP 135/82 | HR 81 | Ht 69.0 in | Wt 312.0 lb

## 2022-11-15 DIAGNOSIS — Z0130 Encounter for examination of blood pressure without abnormal findings: Secondary | ICD-10-CM

## 2022-11-15 DIAGNOSIS — Z4889 Encounter for other specified surgical aftercare: Secondary | ICD-10-CM | POA: Diagnosis not present

## 2022-11-15 DIAGNOSIS — Z5189 Encounter for other specified aftercare: Secondary | ICD-10-CM

## 2022-11-15 NOTE — Telephone Encounter (Signed)
Angela Flynn 1996/02/25  Patient Location: Home  Provider Location: MAU  Telehealth Type: Phone  Patient called maternity assessment unit requesting advice.  Patient reports that her Provena suddenly cut off and per the instructions it advised that she seek help immediately.  Patient states that she had a repeat C-section on July 27.  Patient denies pain, but expresses concern about infection.  Dr. Austin Miles consulted and advised okay for patient to remove Provena bandage.  Patient expresses concern with removing and questions if she can leave it on until her appointment at 0900.  Reassurance given and patient advised to leave bandage on for the last 5 office visit can remove and clean area.  Precautions reviewed.  Cherre Robins MSN, CNM Advanced Practice Provider, Center for [facility]   **This visit was completed, in its entirety, via telehealth communications.  I personally spent >/=8 minutes on the phone providing recommendations, education, and/or guidance.**

## 2022-11-15 NOTE — Progress Notes (Signed)
Patient presents to office today for wound check following repeat c-section 7/27. She went to MAU last night for elevated BP concerns- started on Nifedipine BID and Lasix. She reports occasional headaches, which she attributes to poor sleep. +2 edema in bilateral lower legs. BP 135/82. Wound vac removed. Incision cleaned and dried. Appears to be healing well- closed & intact. Reviewed wound care and discussed signs of infection. Patient will return for pp visit on 9/4- will reach out with any concerns before then if needed.   Chase Caller RN BSN 11/15/22

## 2022-11-28 ENCOUNTER — Emergency Department: Payer: Medicaid Other

## 2022-11-28 ENCOUNTER — Emergency Department
Admission: EM | Admit: 2022-11-28 | Discharge: 2022-11-28 | Disposition: A | Discharge status: Home / Self Care | Payer: Medicaid Other | Attending: Emergency Medicine | Admitting: Emergency Medicine

## 2022-11-28 ENCOUNTER — Other Ambulatory Visit: Payer: Self-pay

## 2022-11-28 DIAGNOSIS — M25511 Pain in right shoulder: Secondary | ICD-10-CM | POA: Insufficient documentation

## 2022-11-28 DIAGNOSIS — M25562 Pain in left knee: Secondary | ICD-10-CM | POA: Insufficient documentation

## 2022-11-28 DIAGNOSIS — Y92410 Unspecified street and highway as the place of occurrence of the external cause: Secondary | ICD-10-CM | POA: Insufficient documentation

## 2022-11-28 DIAGNOSIS — M25561 Pain in right knee: Secondary | ICD-10-CM | POA: Insufficient documentation

## 2022-11-28 MED ORDER — ACETAMINOPHEN 500 MG PO TABS
1000.0000 mg | ORAL_TABLET | Freq: Once | ORAL | Status: AC
  Administered 2022-11-28: 1000 mg via ORAL
  Filled 2022-11-28: qty 2

## 2022-11-28 MED ORDER — IBUPROFEN 600 MG PO TABS
600.0000 mg | ORAL_TABLET | Freq: Once | ORAL | Status: AC
  Administered 2022-11-28: 600 mg via ORAL
  Filled 2022-11-28: qty 1

## 2022-11-28 NOTE — ED Notes (Signed)
Patient c/o dizziness (lightheadedness) and neck discomfort. MD made aware. New vitals entered.

## 2022-11-28 NOTE — ED Notes (Signed)
Patient to go to CT with child. Patient ambulates with a slight limp
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
## 2022-12-05 ENCOUNTER — Telehealth (HOSPITAL_COMMUNITY): Payer: Self-pay
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
## 2022-12-06 ENCOUNTER — Telehealth (HOSPITAL_COMMUNITY): Payer: Self-pay | Admitting: *Deleted

## 2022-12-06 NOTE — Telephone Encounter (Signed)
12/06/2022  Name: Angela Flynn MRN: [id number] DOB: [birth year]  Reason for Call:  Transition of Care Hospital Discharge Call  Contact Status: Patient Contact Status: Unable to contact  Language assistant needed:          Follow-Up Questions:    Inocente Salles Postnatal Depression Scale:  In the Past 7 Days: On 12/05/2022 patient requested call back to complete the EPDS.  Attempted to call her today, but got a message saying "voice mail is full and cannot accept any new messages."   PHQ2-9 Depression Scale:     Discharge Follow-up:    Post-discharge interventions: NA  Salena Saner, RN 12/06/2022 09:47

## 2022-12-08 ENCOUNTER — Other Ambulatory Visit: Payer: Self-pay

## 2022-12-08 DIAGNOSIS — O24439 Gestational diabetes mellitus in the puerperium, unspecified control: Secondary | ICD-10-CM

## 2022-12-08 DIAGNOSIS — O24410 Gestational diabetes mellitus in pregnancy, diet controlled: Secondary | ICD-10-CM

## 2022-12-08 DIAGNOSIS — O2493 Unspecified diabetes mellitus in the puerperium: Secondary | ICD-10-CM

## 2022-12-14 ENCOUNTER — Other Ambulatory Visit: Payer: Medicaid Other

## 2022-12-14 ENCOUNTER — Ambulatory Visit (INDEPENDENT_AMBULATORY_CARE_PROVIDER_SITE_OTHER): Payer: Medicaid Other | Admitting: Certified Nurse Midwife

## 2022-12-14 ENCOUNTER — Telehealth: Payer: Self-pay

## 2022-12-14 DIAGNOSIS — Z8759 Personal history of other complications of pregnancy, childbirth and the puerperium: Secondary | ICD-10-CM

## 2022-12-14 DIAGNOSIS — Z8632 Personal history of gestational diabetes: Secondary | ICD-10-CM

## 2022-12-14 DIAGNOSIS — Z98891 History of uterine scar from previous surgery: Secondary | ICD-10-CM

## 2022-12-14 NOTE — Telephone Encounter (Signed)
Called pt to follow up on missed PP visit. VM full. MyChart message sent.

## 2022-12-15 NOTE — Progress Notes (Signed)
Pt did not come to visit.

## 2023-02-21 ENCOUNTER — Ambulatory Visit: Payer: Self-pay

## 2023-02-21 NOTE — Telephone Encounter (Signed)
Message from La Conner M sent at 02/21/2023  2:21 PM EST  Summary: swelling bruising both legs bottom part with severe pain.   Pt mother stated pt has swelling on her legs, bruising both legs bottom part. Stated going on for about two weeks with severe pain.  Pt was not with mom.  Please call pt directly at [phone]  Pt was seen by Rene Kocher at [facility] on 01/07/2021.  Seeking clinical advice.         Chief Complaint: bilateral swelling from knees to feet Symptoms: pain to calves and swelling is painful to touch, pt stated has had intermittent chest pain, blurry vision, headache Frequency: 4 months  Pertinent Negatives: Disposition: [x] ED /[] Urgent Care (no appt availability in office) / [] Appointment(In office/virtual)/ []  [place] [facility]/ [] Home Care/ [] Refused Recommended Disposition /[] [place] [facility]/ []  Follow-up with PCP Additional Notes: pt stated she has had swelling since giving birth, pt stated the swelling to her knee alternates in size with her left knee. Advised pt to go to ED to R/O DVT and evaluation  Reason for Disposition  [1] Swelling is painful to touch AND [2] fever  Answer Assessment - Initial Assessment Questions 1. ONSET: "When did the swelling start?" (e.g., minutes, hours, days)     Almost 4 months 2. LOCATION: "What part of the leg is swollen?"  "Are both legs swollen or just one leg?"     Left: knees down  right: knees down feels warm blow knee calf swelling but get bigger 3. SEVERITY: "How bad is the swelling?" (e.g., localized; mild, moderate, severe)   - Localized: Small area of swelling localized to one leg.   - MILD pedal edema: Swelling limited to foot and ankle, pitting edema < 1/4 inch (6 mm) deep, rest and elevation eliminate most or all swelling.   - MODERATE edema: Swelling of lower leg to knee, pitting edema > 1/4 inch (6 mm) deep, rest and elevation only partially reduce swelling.   - SEVERE edema: Swelling extends above knee,  facial or hand swelling present.      Moderate 4. REDNESS: "Does the swelling look red or infected?"     Bruising  5. PAIN: "Is the swelling painful to touch?" If Yes, ask: "How painful is it?"   (Scale 1-10; mild, moderate or severe)     Yes very tender 6. FEVER: "Do you have a fever?" If Yes, ask: "What is it, how was it measured, and when did it start?"      no 10. OTHER SYMPTOMS: "Do you have any other symptoms?" (e.g., chest pain, difficulty breathing)       Has bruising- occasional pain in chest - occasional blurry vision, 4 headaches.  11. PREGNANCY: "Is there any chance you are pregnant?" "When was your last menstrual period?"       N/a  Protocols used: Leg Swelling and Edema-A-AH

## 2023-06-29 ENCOUNTER — Ambulatory Visit: Payer: Self-pay

## 2023-06-29 NOTE — Telephone Encounter (Signed)
 Copied from CRM [id number]. Topic: Clinical - Red Word Triage >> Jun 29, 2023  8:24 AM Elle L wrote: Red Word that prompted transfer to Nurse Triage: The patient's grandmother, Angela Flynn, called on behalf of the patient as she is having a lot of stomach pain from the right side to her belly button.   Chief Complaint: Abdominal Pain  Symptoms: Abdominal Pain, Nausea  Frequency: 2 Days Ago  Pertinent Negatives: Patient denies chest  Disposition: [] ED /[x] Urgent Care (no appt availability in office) / [] Appointment(In office/virtual)/ []  [place] [facility]/ [] Home Care/ [] Refused Recommended Disposition /[] [place] [facility]/ []  Follow-up with PCP  Additional Notes: Grandmother called on behalf of patient, then called patient directly. SJ is being triaged for abdominal pain that is acute in nature and is on the left side of her abdominal area. The patient states she has had a headache and nausea. The patient denies a fever or any other symptoms at this time. Due to the patient's length of time since last visit, unable to schedule in office, recommended Urgent Care for prompt evaluation and treatment. Patient agreed to disposition, but stated it would have to be after work.    Reason for Disposition  [1] MILD-MODERATE pain AND [2] constant AND [3] present > 2 hours  Answer Assessment - Initial Assessment Questions 1. LOCATION: "Where does it hurt?"      Abdominal pain on the left side   2. RADIATION: "Does the pain shoot anywhere else?" (e.g., chest, back)     Umbilicus  3. ONSET: "When did the pain begin?" (e.g., minutes, hours or days ago)      Since Yesterday  4. SUDDEN: "Gradual or sudden onset?"     Unsure  5. PATTERN "Does the pain come and go, or is it constant?"    - If it comes and goes: "How long does it last?" "Do you have pain now?"     (Note: Comes and goes means the pain is intermittent. It goes away completely between bouts.)    - If constant: "Is it getting better,  staying the same, or getting worse?"      (Note: Constant means the pain never goes away completely; most serious pain is constant and gets worse.)      Constant, worse with pressure or movement  6. SEVERITY: "How bad is the pain?"  (e.g., Scale 1-10; mild, moderate, or severe)    - MILD (1-3): Doesn't interfere with normal activities, abdomen soft and not tender to touch.     - MODERATE (4-7): Interferes with normal activities or awakens from sleep, abdomen tender to touch.     - SEVERE (8-10): Excruciating pain, doubled over, unable to do any normal activities.       7  7. RECURRENT SYMPTOM: "Have you ever had this type of stomach pain before?" If Yes, ask: "When was the last time?" and "What happened that time?"      No  8. CAUSE: "What do you think is causing the stomach pain?"      Unsure  9. RELIEVING/AGGRAVATING FACTORS: "What makes it better or worse?" (e.g., antacids, bending or twisting motion, bowel movement)    Movement makes it worse  10. OTHER SYMPTOMS: "Do you have any other symptoms?" (e.g., back pain, diarrhea, fever, urination pain, vomiting)       Headaches, Nausea  11. PREGNANCY: "Is there any chance you are pregnant?" "When was your last menstrual period?"       No, LMP the  beginning of last month.  Protocols used: Abdominal Pain - Female-A-AH

## 2023-06-30 ENCOUNTER — Ambulatory Visit: Admitting: Internal Medicine

## 2023-06-30 NOTE — Progress Notes (Deleted)
 Subjective:    Patient ID: Angela Flynn, female    DOB: 1996/02/23, 28 y.o.   MRN: [id number]  HPI  Patient presents to clinic today for follow-up of chronic conditions.  HTN: Her BP today is.  She is taking as prescribed.  ECG from 10/2020 reviewed.  Prediabetes: Her last A1c was 5.8%, 04/2022.  She is not taking any oral diabetic medication at this time.  She does not check her sugars.  Genital herpes: She is not currently taking any suppressive antiviral medication.  History of DVT: In pregnancy.  She is no longer on anticoagulation.  She does not follow with hematology.  Review of Systems   Past Medical History:  Diagnosis Date   Allergy    seasonal, dusts   Complication of anesthesia    Felt like she couldn't breathe with mask on after 1 procedure   DVT (deep vein thrombosis) in pregnancy    Gestational diabetes    Headache    2-3 per week   Herpes genitalis    Hypertension 11/04/2022   review of BPs show ^ BPs since 2022   MRSA infection    abscesses - history of   Pregnancy induced hypertension     Current Outpatient Medications  Medication Sig Dispense Refill   acetaminophen (TYLENOL) 500 MG tablet Take 2 tablets (1,000 mg total) by mouth every 6 (six) hours then as directed therafter 100 tablet 0   Blood Pressure Monitor MISC Use as directed for monitoring blood pressure 1 each 0   furosemide (LASIX) 20 MG tablet Take 1 tablet (20 mg total) by mouth daily for 5 days. 5 tablet 0   ibuprofen (ADVIL) 600 MG tablet Take 1 tablet (600 mg total) by mouth every 6 (six) hours. 30 tablet 0   magnesium 30 MG tablet Take 30 mg by mouth 2 (two) times daily.     NIFEdipine (ADALAT CC) 30 MG 24 hr tablet Take 1 tablet (30 mg total) by mouth 2 (two) times daily at 8 am and 10 pm. Take with breakfast and before bed 30 tablet 0   POTASSIUM BICARBONATE PO Take by mouth.     No current facility-administered medications for this visit.    Allergies  Allergen Reactions    Tape     Blisters from tape used after c-section   Silicone Rash    Blisters from tape used after c-section    Family History  Problem Relation Age of Onset   Bipolar disorder Mother    Heart disease Mother    Heart disease Sister    Hypertension Sister    Diabetes Maternal Grandmother    Hypertension Maternal Grandmother     Social History   Socioeconomic History   Marital status: Single    Spouse name: Not on file   Number of children: 1   Years of education: 12   Highest education level: Not on file  Occupational History   Occupation: unemployed  Tobacco Use   Smoking status: Never   Smokeless tobacco: Never   Tobacco comments:    Black and mild occasionally   Vaping Use   Vaping status: Never Used  Substance and Sexual Activity   Alcohol use: Not Currently   Drug use: Not Currently    Types: Marijuana    Comment: last use November 2023   Sexual activity: Not Currently    Partners: Male    Birth control/protection: None  Other Topics Concern   Not on file  Social  History Narrative   Not on file   Social Drivers of Health   Financial Resource Strain: Low Risk  (04/01/2022)   Overall Financial Resource Strain (CARDIA)    Difficulty of Paying Living Expenses: Not hard at all  Food Insecurity: No Food Insecurity (11/04/2022)   Hunger Vital Sign    Worried About Running Out of Food in the Last Year: Never true    Ran Out of Food in the Last Year: Never true  Transportation Needs: No Transportation Needs (11/04/2022)   PRAPARE - Administrator, Civil Service (Medical): No    Lack of Transportation (Non-Medical): No  Physical Activity: Inactive (04/01/2022)   Exercise Vital Sign    Days of Exercise per Week: 0 days    Minutes of Exercise per Session: 0 min  Stress: Stress Concern Present (04/01/2022)   [employer] of Occupational Health - Occupational Stress Questionnaire    Feeling of Stress : Rather much  Social Connections: Unknown  (04/01/2022)   Social Connection and Isolation Panel [NHANES]    Frequency of Communication with Friends and Family: Patient declined    Frequency of Social Gatherings with Friends and Family: Never    Attends Religious Services: Never    Database administrator or Organizations: No    Attends Banker Meetings: Never    Marital Status: Not on file  Intimate Partner Violence: Not At Risk (11/04/2022)   Humiliation, Afraid, Rape, and Kick questionnaire    Fear of Current or Ex-Partner: No    Emotionally Abused: No    Physically Abused: No    Sexually Abused: No     Constitutional: Patient reports headache.  Denies fever, malaise, fatigue, or abrupt weight changes.  HEENT: Denies eye pain, eye redness, ear pain, ringing in the ears, wax buildup, runny nose, nasal congestion, bloody nose, or sore throat. Respiratory: Denies difficulty breathing, shortness of breath, cough or sputum production.   Cardiovascular: Denies chest pain, chest tightness, palpitations or swelling in the hands or feet.  Gastrointestinal: Patient reports abdominal pain, nausea.  Denies bloating, constipation, diarrhea or blood in the stool.  GU: Denies urgency, frequency, pain with urination, burning sensation, blood in urine, odor or discharge. Musculoskeletal: Denies decrease in range of motion, difficulty with gait, muscle pain or joint pain and swelling.  Skin: Denies redness, rashes, lesions or ulcercations.  Neurological: Denies dizziness, difficulty with memory, difficulty with speech or problems with balance and coordination.  Psych: Denies anxiety, depression, SI/HI.  No other specific complaints in a complete review of systems (except as listed in HPI above).      Objective:   Physical Exam  There were no vitals taken for this visit. Wt Readings from Last 3 Encounters:  11/28/22 290 lb (131.5 kg)  11/15/22 (!) 312 lb (141.5 kg)  11/04/22 (!) 323 lb 8 oz (146.7 kg)    General: Appears  their stated age, well developed, well nourished in NAD. Skin: Warm, dry and intact. No rashes, lesions or ulcerations noted. HEENT: Head: normal shape and size; Eyes: sclera white, no icterus, conjunctiva pink, PERRLA and EOMs intact; Ears: Tm's gray and intact, normal light reflex; Nose: mucosa pink and moist, septum midline; Throat/Mouth: Teeth present, mucosa pink and moist, no exudate, lesions or ulcerations noted.  Neck:  Neck supple, trachea midline. No masses, lumps or thyromegaly present.  Cardiovascular: Normal rate and rhythm. S1,S2 noted.  No murmur, rubs or gallops noted. No JVD or BLE edema. No carotid bruits noted.  Pulmonary/Chest: Normal effort and positive vesicular breath sounds. No respiratory distress. No wheezes, rales or ronchi noted.  Abdomen: Soft and nontender. Normal bowel sounds. No distention or masses noted. Liver, spleen and kidneys non palpable. Musculoskeletal: Normal range of motion. No signs of joint swelling. No difficulty with gait.  Neurological: Alert and oriented. Cranial nerves II-XII grossly intact. Coordination normal.  Psychiatric: Mood and affect normal. Behavior is normal. Judgment and thought content normal.    BMET    Component Value Date/Time   NA 133 (L) 11/14/2022 0401   NA 135 09/14/2022 1711   NA 140 09/05/2012 1252   K 3.5 11/14/2022 0401   K 3.9 09/05/2012 1252   CL 101 11/14/2022 0401   CL 103 09/05/2012 1252   CO2 23 11/14/2022 0401   CO2 27 09/05/2012 1252   GLUCOSE 101 (H) 11/14/2022 0401   GLUCOSE 97 09/05/2012 1252   BUN 12 11/14/2022 0401   BUN 6 09/14/2022 1711   BUN 16 09/05/2012 1252   CREATININE 0.59 11/14/2022 0401   CREATININE 0.64 01/07/2021 1404   CALCIUM 8.7 (L) 11/14/2022 0401   CALCIUM 8.9 (L) 09/05/2012 1252   GFRNONAA >60 11/14/2022 0401   GFRNONAA >60 09/05/2012 1252   GFRAA >60 04/06/2019 1830   GFRAA >60 09/05/2012 1252    Lipid Panel  No results found for: "CHOL", "TRIG", "HDL", "CHOLHDL", "VLDL",  "LDLCALC"  CBC    Component Value Date/Time   WBC 9.2 11/14/2022 0401   RBC 3.83 (L) 11/14/2022 0401   HGB 10.4 (L) 11/14/2022 0401   HGB 12.3 09/14/2022 1711   HCT 32.9 (L) 11/14/2022 0401   HCT 35.9 09/14/2022 1711   PLT 441 (H) 11/14/2022 0401   PLT 317 09/14/2022 1711   MCV 85.9 11/14/2022 0401   MCV 85 09/14/2022 1711   MCV 90 09/05/2012 1252   MCH 27.2 11/14/2022 0401   MCHC 31.6 11/14/2022 0401   RDW 13.7 11/14/2022 0401   RDW 12.3 09/14/2022 1711   RDW 13.1 09/05/2012 1252   LYMPHSABS 2.0 05/02/2022 1627   LYMPHSABS 2.1 09/05/2012 1252   MONOABS 0.3 04/06/2019 1659   MONOABS 0.4 09/05/2012 1252   EOSABS 0.1 05/02/2022 1627   EOSABS 0.0 09/05/2012 1252   BASOSABS 0.0 05/02/2022 1627   BASOSABS 0.1 09/05/2012 1252    Hgb A1C Lab Results  Component Value Date   HGBA1C 5.8 (H) 05/02/2022            Assessment & Plan:    RTC in 6 months for your annual exam Nicki Reaper, NP

## 2023-07-03 ENCOUNTER — Ambulatory Visit: Admitting: Internal Medicine

## 2023-07-03 NOTE — Progress Notes (Deleted)
 Subjective:    Patient ID: Angela Flynn, female    DOB: 1996/02/23, 28 y.o.   MRN: [id number]  HPI  Patient presents to clinic today for follow-up of chronic conditions.  HTN: Her BP today is.  She is taking as prescribed.  ECG from 10/2020 reviewed.  Prediabetes: Her last A1c was 5.8%, 04/2022.  She is not taking any oral diabetic medication at this time.  She does not check her sugars.  Genital herpes: She is not currently taking any suppressive antiviral medication.  History of DVT: In pregnancy.  She is no longer on anticoagulation.  She does not follow with hematology.  Review of Systems   Past Medical History:  Diagnosis Date   Allergy    seasonal, dusts   Complication of anesthesia    Felt like she couldn't breathe with mask on after 1 procedure   DVT (deep vein thrombosis) in pregnancy    Gestational diabetes    Headache    2-3 per week   Herpes genitalis    Hypertension 11/04/2022   review of BPs show ^ BPs since 2022   MRSA infection    abscesses - history of   Pregnancy induced hypertension     Current Outpatient Medications  Medication Sig Dispense Refill   acetaminophen (TYLENOL) 500 MG tablet Take 2 tablets (1,000 mg total) by mouth every 6 (six) hours then as directed therafter 100 tablet 0   Blood Pressure Monitor MISC Use as directed for monitoring blood pressure 1 each 0   furosemide (LASIX) 20 MG tablet Take 1 tablet (20 mg total) by mouth daily for 5 days. 5 tablet 0   ibuprofen (ADVIL) 600 MG tablet Take 1 tablet (600 mg total) by mouth every 6 (six) hours. 30 tablet 0   magnesium 30 MG tablet Take 30 mg by mouth 2 (two) times daily.     NIFEdipine (ADALAT CC) 30 MG 24 hr tablet Take 1 tablet (30 mg total) by mouth 2 (two) times daily at 8 am and 10 pm. Take with breakfast and before bed 30 tablet 0   POTASSIUM BICARBONATE PO Take by mouth.     No current facility-administered medications for this visit.    Allergies  Allergen Reactions    Tape     Blisters from tape used after c-section   Silicone Rash    Blisters from tape used after c-section    Family History  Problem Relation Age of Onset   Bipolar disorder Mother    Heart disease Mother    Heart disease Sister    Hypertension Sister    Diabetes Maternal Grandmother    Hypertension Maternal Grandmother     Social History   Socioeconomic History   Marital status: Single    Spouse name: Not on file   Number of children: 1   Years of education: 12   Highest education level: Not on file  Occupational History   Occupation: unemployed  Tobacco Use   Smoking status: Never   Smokeless tobacco: Never   Tobacco comments:    Black and mild occasionally   Vaping Use   Vaping status: Never Used  Substance and Sexual Activity   Alcohol use: Not Currently   Drug use: Not Currently    Types: Marijuana    Comment: last use November 2023   Sexual activity: Not Currently    Partners: Male    Birth control/protection: None  Other Topics Concern   Not on file  Social  History Narrative   Not on file   Social Drivers of Health   Financial Resource Strain: Low Risk  (04/01/2022)   Overall Financial Resource Strain (CARDIA)    Difficulty of Paying Living Expenses: Not hard at all  Food Insecurity: No Food Insecurity (11/04/2022)   Hunger Vital Sign    Worried About Running Out of Food in the Last Year: Never true    Ran Out of Food in the Last Year: Never true  Transportation Needs: No Transportation Needs (11/04/2022)   PRAPARE - Administrator, Civil Service (Medical): No    Lack of Transportation (Non-Medical): No  Physical Activity: Inactive (04/01/2022)   Exercise Vital Sign    Days of Exercise per Week: 0 days    Minutes of Exercise per Session: 0 min  Stress: Stress Concern Present (04/01/2022)   [employer] of Occupational Health - Occupational Stress Questionnaire    Feeling of Stress : Rather much  Social Connections: Unknown  (04/01/2022)   Social Connection and Isolation Panel [NHANES]    Frequency of Communication with Friends and Family: Patient declined    Frequency of Social Gatherings with Friends and Family: Never    Attends Religious Services: Never    Database administrator or Organizations: No    Attends Banker Meetings: Never    Marital Status: Not on file  Intimate Partner Violence: Not At Risk (11/04/2022)   Humiliation, Afraid, Rape, and Kick questionnaire    Fear of Current or Ex-Partner: No    Emotionally Abused: No    Physically Abused: No    Sexually Abused: No     Constitutional: Patient reports headache.  Denies fever, malaise, fatigue, or abrupt weight changes.  HEENT: Denies eye pain, eye redness, ear pain, ringing in the ears, wax buildup, runny nose, nasal congestion, bloody nose, or sore throat. Respiratory: Denies difficulty breathing, shortness of breath, cough or sputum production.   Cardiovascular: Denies chest pain, chest tightness, palpitations or swelling in the hands or feet.  Gastrointestinal: Patient reports abdominal pain, nausea.  Denies bloating, constipation, diarrhea or blood in the stool.  GU: Denies urgency, frequency, pain with urination, burning sensation, blood in urine, odor or discharge. Musculoskeletal: Denies decrease in range of motion, difficulty with gait, muscle pain or joint pain and swelling.  Skin: Denies redness, rashes, lesions or ulcercations.  Neurological: Denies dizziness, difficulty with memory, difficulty with speech or problems with balance and coordination.  Psych: Denies anxiety, depression, SI/HI.  No other specific complaints in a complete review of systems (except as listed in HPI above).      Objective:   Physical Exam  There were no vitals taken for this visit. Wt Readings from Last 3 Encounters:  11/28/22 290 lb (131.5 kg)  11/15/22 (!) 312 lb (141.5 kg)  11/04/22 (!) 323 lb 8 oz (146.7 kg)    General: Appears  their stated age, well developed, well nourished in NAD. Skin: Warm, dry and intact. No rashes, lesions or ulcerations noted. HEENT: Head: normal shape and size; Eyes: sclera white, no icterus, conjunctiva pink, PERRLA and EOMs intact; Ears: Tm's gray and intact, normal light reflex; Nose: mucosa pink and moist, septum midline; Throat/Mouth: Teeth present, mucosa pink and moist, no exudate, lesions or ulcerations noted.  Neck:  Neck supple, trachea midline. No masses, lumps or thyromegaly present.  Cardiovascular: Normal rate and rhythm. S1,S2 noted.  No murmur, rubs or gallops noted. No JVD or BLE edema. No carotid bruits noted.  Pulmonary/Chest: Normal effort and positive vesicular breath sounds. No respiratory distress. No wheezes, rales or ronchi noted.  Abdomen: Soft and nontender. Normal bowel sounds. No distention or masses noted. Liver, spleen and kidneys non palpable. Musculoskeletal: Normal range of motion. No signs of joint swelling. No difficulty with gait.  Neurological: Alert and oriented. Cranial nerves II-XII grossly intact. Coordination normal.  Psychiatric: Mood and affect normal. Behavior is normal. Judgment and thought content normal.    BMET    Component Value Date/Time   NA 133 (L) 11/14/2022 0401   NA 135 09/14/2022 1711   NA 140 09/05/2012 1252   K 3.5 11/14/2022 0401   K 3.9 09/05/2012 1252   CL 101 11/14/2022 0401   CL 103 09/05/2012 1252   CO2 23 11/14/2022 0401   CO2 27 09/05/2012 1252   GLUCOSE 101 (H) 11/14/2022 0401   GLUCOSE 97 09/05/2012 1252   BUN 12 11/14/2022 0401   BUN 6 09/14/2022 1711   BUN 16 09/05/2012 1252   CREATININE 0.59 11/14/2022 0401   CREATININE 0.64 01/07/2021 1404   CALCIUM 8.7 (L) 11/14/2022 0401   CALCIUM 8.9 (L) 09/05/2012 1252   GFRNONAA >60 11/14/2022 0401   GFRNONAA >60 09/05/2012 1252   GFRAA >60 04/06/2019 1830   GFRAA >60 09/05/2012 1252    Lipid Panel  No results found for: "CHOL", "TRIG", "HDL", "CHOLHDL", "VLDL",  "LDLCALC"  CBC    Component Value Date/Time   WBC 9.2 11/14/2022 0401   RBC 3.83 (L) 11/14/2022 0401   HGB 10.4 (L) 11/14/2022 0401   HGB 12.3 09/14/2022 1711   HCT 32.9 (L) 11/14/2022 0401   HCT 35.9 09/14/2022 1711   PLT 441 (H) 11/14/2022 0401   PLT 317 09/14/2022 1711   MCV 85.9 11/14/2022 0401   MCV 85 09/14/2022 1711   MCV 90 09/05/2012 1252   MCH 27.2 11/14/2022 0401   MCHC 31.6 11/14/2022 0401   RDW 13.7 11/14/2022 0401   RDW 12.3 09/14/2022 1711   RDW 13.1 09/05/2012 1252   LYMPHSABS 2.0 05/02/2022 1627   LYMPHSABS 2.1 09/05/2012 1252   MONOABS 0.3 04/06/2019 1659   MONOABS 0.4 09/05/2012 1252   EOSABS 0.1 05/02/2022 1627   EOSABS 0.0 09/05/2012 1252   BASOSABS 0.0 05/02/2022 1627   BASOSABS 0.1 09/05/2012 1252    Hgb A1C Lab Results  Component Value Date   HGBA1C 5.8 (H) 05/02/2022            Assessment & Plan:    RTC in 6 months for your annual exam Nicki Reaper, NP
